# Patient Record
Sex: Female | Born: 1945
Health system: Southern US, Community
[De-identification: ages and names within clinical notes are randomized; demographics above are authoritative.]

## PROBLEM LIST (undated history)

## (undated) DIAGNOSIS — I7300 Raynaud's syndrome without gangrene: Secondary | ICD-10-CM

## (undated) DIAGNOSIS — N952 Postmenopausal atrophic vaginitis: Secondary | ICD-10-CM

## (undated) DIAGNOSIS — S022XXA Fracture of nasal bones, initial encounter for closed fracture: Secondary | ICD-10-CM

## (undated) DIAGNOSIS — R112 Nausea with vomiting, unspecified: Secondary | ICD-10-CM

## (undated) DIAGNOSIS — IMO0002 Reserved for concepts with insufficient information to code with codable children: Secondary | ICD-10-CM

## (undated) DIAGNOSIS — R350 Frequency of micturition: Secondary | ICD-10-CM

## (undated) DIAGNOSIS — M349 Systemic sclerosis, unspecified: Secondary | ICD-10-CM

## (undated) DIAGNOSIS — L719 Rosacea, unspecified: Secondary | ICD-10-CM

## (undated) DIAGNOSIS — Z9889 Other specified postprocedural states: Secondary | ICD-10-CM

## (undated) DIAGNOSIS — M8580 Other specified disorders of bone density and structure, unspecified site: Secondary | ICD-10-CM

## (undated) DIAGNOSIS — Z460 Encounter for fitting and adjustment of spectacles and contact lenses: Secondary | ICD-10-CM

## (undated) DIAGNOSIS — C801 Malignant (primary) neoplasm, unspecified: Secondary | ICD-10-CM

## (undated) DIAGNOSIS — N816 Rectocele: Secondary | ICD-10-CM

## (undated) DIAGNOSIS — I7090 Unspecified atherosclerosis: Secondary | ICD-10-CM

## (undated) HISTORY — DX: Other specified disorders of bone density and structure, unspecified site: M85.80

## (undated) HISTORY — DX: Malignant (primary) neoplasm, unspecified: C80.1

## (undated) HISTORY — PX: OOPHORECTOMY: SHX86

## (undated) HISTORY — DX: Reserved for concepts with insufficient information to code with codable children: IMO0002

## (undated) HISTORY — PX: TONSILLECTOMY: SUR1361

## (undated) HISTORY — DX: Rectocele: N81.6

## (undated) HISTORY — DX: Systemic sclerosis, unspecified: M34.9

## (undated) HISTORY — DX: Postmenopausal atrophic vaginitis: N95.2

## (undated) HISTORY — PX: NOSE SURGERY: SHX723

## (undated) HISTORY — DX: Raynaud's syndrome without gangrene: I73.00

## (undated) HISTORY — PX: OTHER SURGICAL HISTORY: SHX169

## (undated) HISTORY — PX: ROTATOR CUFF REPAIR: SHX139

## (undated) HISTORY — PX: APPENDECTOMY: SHX54

## (undated) HISTORY — DX: Rosacea, unspecified: L71.9

---

## 1992-02-11 HISTORY — PX: VAGINAL HYSTERECTOMY: SUR661

## 1993-02-10 DIAGNOSIS — C801 Malignant (primary) neoplasm, unspecified: Secondary | ICD-10-CM

## 1993-02-10 HISTORY — DX: Malignant (primary) neoplasm, unspecified: C80.1

## 1993-02-10 HISTORY — PX: COLON RESECTION: SHX5231

## 1997-06-28 ENCOUNTER — Other Ambulatory Visit: Admission: RE | Admit: 1997-06-28 | Discharge: 1997-06-28 | Payer: Self-pay | Admitting: Obstetrics and Gynecology

## 1998-10-31 ENCOUNTER — Other Ambulatory Visit: Admission: RE | Admit: 1998-10-31 | Discharge: 1998-10-31 | Payer: Self-pay | Admitting: Obstetrics and Gynecology

## 1999-11-29 ENCOUNTER — Ambulatory Visit (HOSPITAL_COMMUNITY): Admission: RE | Admit: 1999-11-29 | Discharge: 1999-11-29 | Payer: Self-pay | Admitting: Gastroenterology

## 2000-01-01 ENCOUNTER — Other Ambulatory Visit: Admission: RE | Admit: 2000-01-01 | Discharge: 2000-01-01 | Payer: Self-pay | Admitting: Obstetrics and Gynecology

## 2001-05-27 ENCOUNTER — Other Ambulatory Visit: Admission: RE | Admit: 2001-05-27 | Discharge: 2001-05-27 | Payer: Self-pay | Admitting: Obstetrics and Gynecology

## 2002-06-08 ENCOUNTER — Other Ambulatory Visit: Admission: RE | Admit: 2002-06-08 | Discharge: 2002-06-08 | Payer: Self-pay | Admitting: Obstetrics and Gynecology

## 2003-06-16 ENCOUNTER — Other Ambulatory Visit: Admission: RE | Admit: 2003-06-16 | Discharge: 2003-06-16 | Payer: Self-pay | Admitting: Obstetrics and Gynecology

## 2004-06-20 ENCOUNTER — Other Ambulatory Visit: Admission: RE | Admit: 2004-06-20 | Discharge: 2004-06-20 | Payer: Self-pay | Admitting: Addiction Medicine

## 2005-06-25 ENCOUNTER — Other Ambulatory Visit: Admission: RE | Admit: 2005-06-25 | Discharge: 2005-06-25 | Payer: Self-pay | Admitting: Obstetrics and Gynecology

## 2005-09-29 ENCOUNTER — Encounter: Admission: RE | Admit: 2005-09-29 | Discharge: 2005-09-29 | Payer: Self-pay | Admitting: Internal Medicine

## 2006-07-30 ENCOUNTER — Other Ambulatory Visit: Admission: RE | Admit: 2006-07-30 | Discharge: 2006-07-30 | Payer: Self-pay | Admitting: Obstetrics and Gynecology

## 2007-07-14 ENCOUNTER — Encounter: Admission: RE | Admit: 2007-07-14 | Discharge: 2007-07-14 | Payer: Self-pay | Admitting: Internal Medicine

## 2007-08-03 ENCOUNTER — Other Ambulatory Visit: Admission: RE | Admit: 2007-08-03 | Discharge: 2007-08-03 | Payer: Self-pay | Admitting: Obstetrics and Gynecology

## 2008-08-30 ENCOUNTER — Ambulatory Visit: Payer: Self-pay | Admitting: Obstetrics and Gynecology

## 2008-08-30 ENCOUNTER — Other Ambulatory Visit: Admission: RE | Admit: 2008-08-30 | Discharge: 2008-08-30 | Payer: Self-pay | Admitting: Obstetrics and Gynecology

## 2008-08-30 ENCOUNTER — Encounter: Payer: Self-pay | Admitting: Obstetrics and Gynecology

## 2008-09-12 ENCOUNTER — Ambulatory Visit: Payer: Self-pay | Admitting: Obstetrics and Gynecology

## 2009-05-21 ENCOUNTER — Ambulatory Visit: Payer: Self-pay | Admitting: Internal Medicine

## 2009-05-24 ENCOUNTER — Encounter: Payer: Self-pay | Admitting: Internal Medicine

## 2009-09-04 ENCOUNTER — Ambulatory Visit: Payer: Self-pay | Admitting: Obstetrics and Gynecology

## 2009-09-04 ENCOUNTER — Other Ambulatory Visit: Admission: RE | Admit: 2009-09-04 | Discharge: 2009-09-04 | Payer: Self-pay | Admitting: Obstetrics and Gynecology

## 2009-09-06 ENCOUNTER — Ambulatory Visit: Payer: Self-pay | Admitting: Obstetrics and Gynecology

## 2010-03-12 NOTE — Miscellaneous (Signed)
Summary: Orders Update pft charges  Clinical Lists Changes  Orders: Added new Service order of Carbon Monoxide diffusing w/capacity (94720) - Signed Added new Service order of Lung Volumes (94240) - Signed Added new Service order of Spirometry (Pre & Post) (94060) - Signed 

## 2010-06-28 NOTE — Procedures (Signed)
[facility]  Patient:    Lindsay Herman, Lindsay Herman                      MRN: [id number] Proc. Date: 11/29/99 Adm. Date:  [id number] Attending:  Orland Mustard CC:         Richard A. Jacky Kindle, M.D.  Sandria Bales. Ezzard Standing, M.D.  Leighton Roach. Truett Perna, M.D.   Procedure Report  PROCEDURE:  Colonoscopy.  MEDICATIONS:  Fentanyl 75 mcg, Versed 6 mg IV.  SCOPE:  Adult Olympus video colonoscope.  INDICATION:  A nice 65 year old woman who had a low anterior resection due to adenocarcinoma of the distal sigmoid, Dukes stage C.  Was treated with 5-FU and levamisole.  This is done as a follow-up.  DESCRIPTION OF PROCEDURE:  The procedure had been explained to the patient and consent obtained.  She was placed in the left lateral decubitus position.  The Olympus adult video colonoscope was inserted, advanced under direct visualization.  The prep was excellent.  We were able to advance to the cecum without difficulty.  The ileocecal valve and appendiceal orifice were seen. No polyps were seen in the cecum.  The ileocecal valve was entered for a short distance.  The scope was withdrawn, and the cecum, ascending colon, hepatic flexure, transverse colon, splenic flexure, descending, and sigmoid colon were seen well upon removal.  No polyps were seen throughout.  The anastomosis in the high rectum was seen and was patent and was normal.  No granulation tissue, sutures, or any evidence of recurrent tumor.  The scope was withdrawn. The patient tolerated the procedure well.  She was maintained on low-flow oxygen and pulse oximetry throughout the procedure with no obvious problem.  ASSESSMENT:  No evidence of further polyp or neoplasia.  PLAN:  Will recommend repeating the procedure in three years. DD:  11/29/99 TD:  11/29/99 Job: [id number]

## 2010-10-02 ENCOUNTER — Other Ambulatory Visit: Payer: Self-pay | Admitting: Obstetrics and Gynecology

## 2010-10-31 ENCOUNTER — Other Ambulatory Visit: Payer: Self-pay | Admitting: Obstetrics and Gynecology

## 2010-11-05 ENCOUNTER — Encounter: Payer: Self-pay | Admitting: Gynecology

## 2010-11-05 DIAGNOSIS — M8580 Other specified disorders of bone density and structure, unspecified site: Secondary | ICD-10-CM | POA: Insufficient documentation

## 2010-11-05 DIAGNOSIS — N952 Postmenopausal atrophic vaginitis: Secondary | ICD-10-CM | POA: Insufficient documentation

## 2010-11-05 DIAGNOSIS — N951 Menopausal and female climacteric states: Secondary | ICD-10-CM | POA: Insufficient documentation

## 2010-11-13 ENCOUNTER — Encounter: Payer: Self-pay | Admitting: Obstetrics and Gynecology

## 2010-11-13 ENCOUNTER — Other Ambulatory Visit (HOSPITAL_COMMUNITY)
Admission: RE | Admit: 2010-11-13 | Discharge: 2010-11-13 | Disposition: A | Discharge status: Home / Self Care | Payer: BC Managed Care – PPO | Source: Ambulatory Visit | Attending: Obstetrics and Gynecology | Admitting: Obstetrics and Gynecology

## 2010-11-13 ENCOUNTER — Ambulatory Visit (INDEPENDENT_AMBULATORY_CARE_PROVIDER_SITE_OTHER): Payer: BC Managed Care – PPO | Admitting: Obstetrics and Gynecology

## 2010-11-13 VITALS — BP 120/70 | Ht 66.0 in | Wt 157.0 lb

## 2010-11-13 DIAGNOSIS — M949 Disorder of cartilage, unspecified: Secondary | ICD-10-CM

## 2010-11-13 DIAGNOSIS — R350 Frequency of micturition: Secondary | ICD-10-CM

## 2010-11-13 DIAGNOSIS — N951 Menopausal and female climacteric states: Secondary | ICD-10-CM

## 2010-11-13 DIAGNOSIS — N8111 Cystocele, midline: Secondary | ICD-10-CM

## 2010-11-13 DIAGNOSIS — Z780 Asymptomatic menopausal state: Secondary | ICD-10-CM

## 2010-11-13 DIAGNOSIS — M8580 Other specified disorders of bone density and structure, unspecified site: Secondary | ICD-10-CM

## 2010-11-13 DIAGNOSIS — R3915 Urgency of urination: Secondary | ICD-10-CM

## 2010-11-13 DIAGNOSIS — Z01419 Encounter for gynecological examination (general) (routine) without abnormal findings: Secondary | ICD-10-CM | POA: Insufficient documentation

## 2010-11-13 DIAGNOSIS — R82998 Other abnormal findings in urine: Secondary | ICD-10-CM

## 2010-11-13 DIAGNOSIS — IMO0002 Reserved for concepts with insufficient information to code with codable children: Secondary | ICD-10-CM

## 2010-11-13 MED ORDER — ESTRADIOL 0.0375 MG/24HR TD PTWK: 1.0000 | MEDICATED_PATCH | TRANSDERMAL | Status: DC

## 2010-11-13 NOTE — Progress Notes (Signed)
Patient came to see me today for her annual GYN exam. She remains on her Climara patch with excellent results. She is up-to-date on mammograms. She is due for followup bone density with osteopenia. She has had a fracture since her last seen her but it was clearly a traumatic fracture. She is having trouble with nocturia x3 or 4 and urgency.  HEENT: Within normal limits. Neck: No masses. Supraclavicular lymph nodes: Not enlarged. Breasts: Examined in both sitting and lying position. Symmetrical without skin changes or masses. Abdomen: Soft no masses guarding or rebound. No hernias. Pelvic: External within normal limits. BUS within normal limits. Vaginal examination shows good estrogen effect, 1 and a half degree cystocele, no enterocele or rectocele. Cervix and uterus absent. Adnexa within normal limits. Rectovaginal confirmatory.   Extremities within normal limits.  Assessment: Menopausal symptoms. Detrussor instability. Osteopenia. Cystocele  Plan: Continue Climara patch. Continue yearly mammograms. Bone density scheduled. Lab work through PCP. Vesicare 5 mg daily samples given

## 2010-11-13 NOTE — Progress Notes (Signed)
Addended by: Landis Martins R on: 11/13/2010 03:38 PM   Modules accepted: Orders

## 2010-12-12 ENCOUNTER — Ambulatory Visit (INDEPENDENT_AMBULATORY_CARE_PROVIDER_SITE_OTHER): Payer: BC Managed Care – PPO

## 2010-12-12 ENCOUNTER — Ambulatory Visit (INDEPENDENT_AMBULATORY_CARE_PROVIDER_SITE_OTHER): Payer: BC Managed Care – PPO | Admitting: Gynecology

## 2010-12-12 ENCOUNTER — Encounter: Payer: Self-pay | Admitting: Obstetrics and Gynecology

## 2010-12-12 DIAGNOSIS — M8580 Other specified disorders of bone density and structure, unspecified site: Secondary | ICD-10-CM

## 2010-12-12 DIAGNOSIS — M899 Disorder of bone, unspecified: Secondary | ICD-10-CM

## 2010-12-12 DIAGNOSIS — Z23 Encounter for immunization: Secondary | ICD-10-CM

## 2011-04-01 ENCOUNTER — Ambulatory Visit (INDEPENDENT_AMBULATORY_CARE_PROVIDER_SITE_OTHER): Payer: BC Managed Care – PPO | Admitting: Obstetrics and Gynecology

## 2011-04-01 DIAGNOSIS — N949 Unspecified condition associated with female genital organs and menstrual cycle: Secondary | ICD-10-CM

## 2011-04-01 DIAGNOSIS — IMO0002 Reserved for concepts with insufficient information to code with codable children: Secondary | ICD-10-CM

## 2011-04-01 DIAGNOSIS — N8111 Cystocele, midline: Secondary | ICD-10-CM

## 2011-04-01 LAB — URINALYSIS W MICROSCOPIC + REFLEX CULTURE
Bilirubin Urine: NEGATIVE
Crystals: NONE SEEN
Glucose, UA: NEGATIVE mg/dL
Hgb urine dipstick: NEGATIVE
Ketones, ur: NEGATIVE mg/dL
Nitrite: NEGATIVE
Protein, ur: NEGATIVE mg/dL
Specific Gravity, Urine: >1.03 — ABNORMAL HIGH (ref 1.005–1.030)

## 2011-04-01 NOTE — Progress Notes (Signed)
Patient came to see me today with symptoms of vaginal pressure and feeling as if something is falling out. She says the symptoms are probably been going on for 2 years but they're getting progressively worse. The most annoying symptom is urgency and frequency of urination. She has no incontinence. We tried Vesicare last year and she gave it  a short trial without success.   Exam: Kennon Portela present. External: Within normal limits. BUS: Within normal limits. Vaginal exam: Patient now has a second-degree cystocele which is worse then her last exam. Her vault is well supported. She has no rectocele. She is excellent estrogen effect.  Assessment: Symptomatic cystocele.  Plan: Discussed medication versus surgery. Discussed recurrence rate with surgery. Discussed need for urodynamics before surgery. For  the moment increased her Vesicare to 10 mg daily. She will inform me when necessary. We will check her for UTI today.

## 2011-04-01 NOTE — Progress Notes (Signed)
Addended by: Dayna Barker on: 04/01/2011 04:15 PM   Modules accepted: Orders

## 2011-04-04 LAB — URINE CULTURE: Colony Count: 10000

## 2011-11-18 ENCOUNTER — Other Ambulatory Visit: Payer: Self-pay | Admitting: Obstetrics and Gynecology

## 2011-11-20 ENCOUNTER — Encounter: Payer: Self-pay | Admitting: Obstetrics and Gynecology

## 2011-12-03 ENCOUNTER — Encounter: Payer: Self-pay | Admitting: Obstetrics and Gynecology

## 2011-12-03 ENCOUNTER — Ambulatory Visit (INDEPENDENT_AMBULATORY_CARE_PROVIDER_SITE_OTHER): Payer: Medicare Other | Admitting: Obstetrics and Gynecology

## 2011-12-03 VITALS — BP 124/70 | Ht 66.5 in | Wt 156.0 lb

## 2011-12-03 DIAGNOSIS — IMO0002 Reserved for concepts with insufficient information to code with codable children: Secondary | ICD-10-CM

## 2011-12-03 DIAGNOSIS — R351 Nocturia: Secondary | ICD-10-CM

## 2011-12-03 DIAGNOSIS — Z780 Asymptomatic menopausal state: Secondary | ICD-10-CM

## 2011-12-03 DIAGNOSIS — M949 Disorder of cartilage, unspecified: Secondary | ICD-10-CM

## 2011-12-03 DIAGNOSIS — N952 Postmenopausal atrophic vaginitis: Secondary | ICD-10-CM

## 2011-12-03 DIAGNOSIS — M899 Disorder of bone, unspecified: Secondary | ICD-10-CM

## 2011-12-03 DIAGNOSIS — M8580 Other specified disorders of bone density and structure, unspecified site: Secondary | ICD-10-CM

## 2011-12-03 DIAGNOSIS — C189 Malignant neoplasm of colon, unspecified: Secondary | ICD-10-CM

## 2011-12-03 DIAGNOSIS — N8111 Cystocele, midline: Secondary | ICD-10-CM

## 2011-12-03 MED ORDER — ESTRADIOL 0.0375 MG/24HR TD PTWK: 1.0000 | MEDICATED_PATCH | TRANSDERMAL | Status: DC

## 2011-12-03 MED ORDER — FESOTERODINE FUMARATE ER 4 MG PO TB24: 4.0000 mg | ORAL_TABLET | Freq: Every day | ORAL | Status: DC

## 2011-12-03 NOTE — Patient Instructions (Signed)
Continue yearly mammograms. Bone density in 2014. 

## 2011-12-03 NOTE — Progress Notes (Signed)
Patient came to see me today for her annual GYN exam. She remains on a Climara patch for menopausal symptoms with excellent results. For a while she used Estrace vaginal cream for atrophic vaginitis which she  now stopped. She doesn't feel the need to use it anymore. She has a cystocele we've been watching. She is getting nocturia x4 and wonders if there is medical treatment without surgery. She does not have urgency or incontinence. She is having no vaginal bleeding. She is having no pelvic pain. Her last Pap smear was 2012. She has always had normal Pap smears. She had a vaginal hysterectomy in 1994 for her dysfunctional uterine bleeding and fibroids. In 1995 she had colon cancer and had the left part of her colon resected  along with her left ovary and fallopian tube and an  Appendectomy. She is up-to-date on her mammograms.Her last bone density was in 2012 and showed osteopenia with out an elevated fracture risk. It was improved over the previous one. She's had no fractures.  ROS: 12 system review done. Pertinent positives above.  HEENT: Within normal limits. Neck: No masses. Supraclavicular lymph nodes: Not enlarged. Breasts: Examined in both sitting and lying position. Symmetrical without skin changes or masses. Abdomen: Soft no masses guarding or rebound. No hernias. Pelvic: External within normal limits. BUS within normal limits. Vaginal examination shows fair  estrogen effect, third degree cystocele. no enterocele or rectocele. Cervix and uterus absent. Adnexa within normal limits. Rectovaginal confirmatory. Extremities within normal limits.  Assessment: #1. Menopausal symptoms #2. Atrophic vaginitis #3. Nocturia #4. Cystocele #5. Osteopenia #6. Colon cancer without existing disease.  Plan: Continue Climara. Continue yearly mammograms. Bone density in 2014. Patient started on Toviaz 4 mgm daily-samples given. Pt will call and let me know how she is doing.The new Pap smear guidelines were  discussed with the patient. Pap not done.

## 2012-03-04 ENCOUNTER — Encounter: Payer: Self-pay | Admitting: Gynecology

## 2012-03-04 ENCOUNTER — Ambulatory Visit (INDEPENDENT_AMBULATORY_CARE_PROVIDER_SITE_OTHER): Payer: Medicare Other | Admitting: Gynecology

## 2012-03-04 DIAGNOSIS — N8111 Cystocele, midline: Secondary | ICD-10-CM

## 2012-03-04 DIAGNOSIS — N816 Rectocele: Secondary | ICD-10-CM

## 2012-03-04 DIAGNOSIS — R351 Nocturia: Secondary | ICD-10-CM

## 2012-03-04 DIAGNOSIS — R3915 Urgency of urination: Secondary | ICD-10-CM

## 2012-03-04 NOTE — Patient Instructions (Signed)
Office will contact you  To arrange urology appointment.

## 2012-03-04 NOTE — Progress Notes (Signed)
Patient presents with a history of cystocele that she feels is getting worse. She now feels more pressure when she is on her feet and her introital opening. Does not notice anything protruding. Her most bothersome symptom though his nocturia which he is getting up 3-4 times nightly because of feelings of having to empty her bladder with little coming out. No frequency during the day dysuria low back pain or difficulty with bowel movements. Does limit her fluid intake after dinner.  No urinary incontinence with sneezing laughing coughing or with urgency incontinence. Is not currently sexually active but possibility remains.  Exam was can present Abdomen soft nontender without masses guarding rebound organomegaly. Pelvic external BUS vagina with second-degree cystocele. Vaginal cuff well supported. First degree rectocele. Bimanual without masses or tenderness. Rectovaginal exam confirms.  Assessment and plan: Cystocele/rectocele/nocturia/urgency symptoms. Check UA rule out infection/blood.  Reviewed options to include expectant management, pessary trial, surgery. Options for surgery to include straightforward anterior/posterior colporrhaphy versus consideration for mesh. Given patient's other symptoms to include nocturia and some urgency I think the more involved bladder evaluations indicated and I'm going to refer her to urology and she agrees with this. We went help her make that arrangement and will go from there.

## 2012-03-08 ENCOUNTER — Telehealth: Payer: Self-pay | Admitting: *Deleted

## 2012-03-08 NOTE — Telephone Encounter (Signed)
Message copied by Aura Camps on Mon Mar 08, 2012 10:51 AM ------      Message from: Dara Lords      Created: Thu Mar 04, 2012 12:57 PM       Schedule appointment with urology RE Dr. Sherron Monday reference cystocele/urgency/nocturia for consideration for repair possible mesh

## 2012-03-08 NOTE — Telephone Encounter (Signed)
Left message for pt to call regarding time and date. Notes faxed to [facility], appointment on feb 5 th 2 12:45 pm

## 2012-03-15 NOTE — Telephone Encounter (Signed)
Pt informed with the below note. 

## 2012-07-26 ENCOUNTER — Other Ambulatory Visit: Payer: Self-pay | Admitting: Orthopedic Surgery

## 2012-08-10 ENCOUNTER — Ambulatory Visit (HOSPITAL_BASED_OUTPATIENT_CLINIC_OR_DEPARTMENT_OTHER): Admission: RE | Admit: 2012-08-10 | Payer: Medicare Other | Source: Ambulatory Visit | Admitting: Orthopedic Surgery

## 2012-08-10 ENCOUNTER — Encounter (HOSPITAL_BASED_OUTPATIENT_CLINIC_OR_DEPARTMENT_OTHER): Admission: RE | Payer: Self-pay | Source: Ambulatory Visit

## 2012-08-10 SURGERY — EXCISION METACARPAL MASS
Anesthesia: Regional | Site: Hand | Laterality: Left

## 2012-08-12 ENCOUNTER — Other Ambulatory Visit: Payer: Self-pay | Admitting: Orthopedic Surgery

## 2012-08-25 ENCOUNTER — Other Ambulatory Visit: Payer: Self-pay | Admitting: Orthopedic Surgery

## 2012-08-25 ENCOUNTER — Encounter (HOSPITAL_BASED_OUTPATIENT_CLINIC_OR_DEPARTMENT_OTHER): Payer: Self-pay | Admitting: *Deleted

## 2012-08-25 DIAGNOSIS — M25511 Pain in right shoulder: Secondary | ICD-10-CM

## 2012-08-25 NOTE — Progress Notes (Signed)
To come in for [id number] having problems with shoulders and may have to have something done later

## 2012-08-27 ENCOUNTER — Encounter (HOSPITAL_BASED_OUTPATIENT_CLINIC_OR_DEPARTMENT_OTHER)
Admission: RE | Admit: 2012-08-27 | Discharge: 2012-08-27 | Disposition: A | Discharge status: Home / Self Care | Payer: Medicare Other | Source: Ambulatory Visit | Attending: Orthopedic Surgery | Admitting: Orthopedic Surgery

## 2012-08-27 LAB — BASIC METABOLIC PANEL
BUN: 13 mg/dL (ref 6–23)
Calcium: 9.7 mg/dL (ref 8.4–10.5)
GFR calc Af Amer: 86 mL/min — ABNORMAL LOW (ref >90)
GFR calc non Af Amer: 75 mL/min — ABNORMAL LOW (ref >90)
Glucose, Bld: 67 mg/dL — ABNORMAL LOW (ref 70–99)
Potassium: 4.6 mEq/L (ref 3.5–5.1)

## 2012-08-31 ENCOUNTER — Encounter (HOSPITAL_BASED_OUTPATIENT_CLINIC_OR_DEPARTMENT_OTHER): Payer: Self-pay | Admitting: Orthopedic Surgery

## 2012-08-31 ENCOUNTER — Encounter (HOSPITAL_BASED_OUTPATIENT_CLINIC_OR_DEPARTMENT_OTHER)
Admission: RE | Disposition: A | Discharge status: Home / Self Care | Payer: Self-pay | Source: Ambulatory Visit | Attending: Orthopedic Surgery

## 2012-08-31 ENCOUNTER — Ambulatory Visit (HOSPITAL_BASED_OUTPATIENT_CLINIC_OR_DEPARTMENT_OTHER)
Admission: RE | Admit: 2012-08-31 | Discharge: 2012-08-31 | Disposition: A | Discharge status: Home / Self Care | Payer: Medicare Other | Source: Ambulatory Visit | Attending: Orthopedic Surgery | Admitting: Orthopedic Surgery

## 2012-08-31 ENCOUNTER — Ambulatory Visit (HOSPITAL_BASED_OUTPATIENT_CLINIC_OR_DEPARTMENT_OTHER): Payer: Medicare Other | Admitting: Anesthesiology

## 2012-08-31 ENCOUNTER — Encounter (HOSPITAL_BASED_OUTPATIENT_CLINIC_OR_DEPARTMENT_OTHER): Payer: Self-pay | Admitting: Anesthesiology

## 2012-08-31 DIAGNOSIS — M949 Disorder of cartilage, unspecified: Secondary | ICD-10-CM | POA: Insufficient documentation

## 2012-08-31 DIAGNOSIS — M349 Systemic sclerosis, unspecified: Secondary | ICD-10-CM | POA: Insufficient documentation

## 2012-08-31 DIAGNOSIS — Z885 Allergy status to narcotic agent status: Secondary | ICD-10-CM | POA: Insufficient documentation

## 2012-08-31 DIAGNOSIS — Z9049 Acquired absence of other specified parts of digestive tract: Secondary | ICD-10-CM | POA: Insufficient documentation

## 2012-08-31 DIAGNOSIS — Z87891 Personal history of nicotine dependence: Secondary | ICD-10-CM | POA: Insufficient documentation

## 2012-08-31 DIAGNOSIS — L719 Rosacea, unspecified: Secondary | ICD-10-CM | POA: Insufficient documentation

## 2012-08-31 DIAGNOSIS — M898X9 Other specified disorders of bone, unspecified site: Secondary | ICD-10-CM | POA: Insufficient documentation

## 2012-08-31 DIAGNOSIS — M19049 Primary osteoarthritis, unspecified hand: Secondary | ICD-10-CM | POA: Insufficient documentation

## 2012-08-31 DIAGNOSIS — D2110 Benign neoplasm of connective and other soft tissue of unspecified upper limb, including shoulder: Secondary | ICD-10-CM | POA: Insufficient documentation

## 2012-08-31 DIAGNOSIS — M899 Disorder of bone, unspecified: Secondary | ICD-10-CM | POA: Insufficient documentation

## 2012-08-31 DIAGNOSIS — Z85038 Personal history of other malignant neoplasm of large intestine: Secondary | ICD-10-CM | POA: Insufficient documentation

## 2012-08-31 HISTORY — DX: Encounter for fitting and adjustment of spectacles and contact lenses: Z46.0

## 2012-08-31 HISTORY — DX: Nausea with vomiting, unspecified: R11.2

## 2012-08-31 HISTORY — DX: Frequency of micturition: R35.0

## 2012-08-31 HISTORY — DX: Other specified postprocedural states: Z98.890

## 2012-08-31 HISTORY — PX: MASS EXCISION: SHX2000

## 2012-08-31 SURGERY — EXCISION MASS
Anesthesia: Regional | Site: Hand | Laterality: Left | Wound class: Clean

## 2012-08-31 MED ORDER — OXYCODONE HCL 5 MG/5ML PO SOLN: 5.0000 mg | Freq: Once | ORAL | Status: DC | PRN

## 2012-08-31 MED ORDER — FENTANYL CITRATE 0.05 MG/ML IJ SOLN
INTRAMUSCULAR | Status: DC | PRN
  Administered 2012-08-31: 50 ug via INTRAVENOUS

## 2012-08-31 MED ORDER — LIDOCAINE HCL (CARDIAC) 20 MG/ML IV SOLN
INTRAVENOUS | Status: DC | PRN
  Administered 2012-08-31: 50 mg via INTRAVENOUS

## 2012-08-31 MED ORDER — TRAMADOL HCL 50 MG PO TABS: 50.0000 mg | ORAL_TABLET | Freq: Four times a day (QID) | ORAL | Status: DC | PRN

## 2012-08-31 MED ORDER — MIDAZOLAM HCL 2 MG/2ML IJ SOLN: 0.5000 mg | Freq: Once | INTRAMUSCULAR | Status: DC | PRN

## 2012-08-31 MED ORDER — ONDANSETRON HCL 4 MG/2ML IJ SOLN
INTRAMUSCULAR | Status: DC | PRN
  Administered 2012-08-31: 4 mg via INTRAVENOUS

## 2012-08-31 MED ORDER — BUPIVACAINE HCL (PF) 0.25 % IJ SOLN
INTRAMUSCULAR | Status: DC | PRN
  Administered 2012-08-31: 7 mL

## 2012-08-31 MED ORDER — CHLORHEXIDINE GLUCONATE 4 % EX LIQD: 60.0000 mL | Freq: Once | CUTANEOUS | Status: DC

## 2012-08-31 MED ORDER — OXYCODONE HCL 5 MG PO TABS: 5.0000 mg | ORAL_TABLET | Freq: Once | ORAL | Status: DC | PRN

## 2012-08-31 MED ORDER — MIDAZOLAM HCL 5 MG/5ML IJ SOLN
INTRAMUSCULAR | Status: DC | PRN
  Administered 2012-08-31: 1 mg via INTRAVENOUS

## 2012-08-31 MED ORDER — LACTATED RINGERS IV SOLN
INTRAVENOUS | Status: DC
Start: 2012-08-31 — End: 2012-08-31
  Administered 2012-08-31: 09:00:00 via INTRAVENOUS

## 2012-08-31 MED ORDER — CEFAZOLIN SODIUM-DEXTROSE 2-3 GM-% IV SOLR
2.0000 g | INTRAVENOUS | Status: AC
  Administered 2012-08-31: 2 g via INTRAVENOUS

## 2012-08-31 MED ORDER — FENTANYL CITRATE 0.05 MG/ML IJ SOLN: 25.0000 ug | INTRAMUSCULAR | Status: DC | PRN

## 2012-08-31 MED ORDER — PROMETHAZINE HCL 25 MG/ML IJ SOLN: 6.2500 mg | INTRAMUSCULAR | Status: DC | PRN

## 2012-08-31 MED ORDER — MEPERIDINE HCL 25 MG/ML IJ SOLN: 6.2500 mg | INTRAMUSCULAR | Status: DC | PRN

## 2012-08-31 MED ORDER — PROPOFOL INFUSION 10 MG/ML OPTIME
INTRAVENOUS | Status: DC | PRN
  Administered 2012-08-31: 50 ug/kg/min via INTRAVENOUS

## 2012-08-31 SURGICAL SUPPLY — 52 items
BANDAGE COBAN STERILE 2 (GAUZE/BANDAGES/DRESSINGS) ×2 IMPLANT
BANDAGE GAUZE ELAST BULKY 4 IN (GAUZE/BANDAGES/DRESSINGS) IMPLANT
BLADE MINI RND TIP GREEN BEAV (BLADE) IMPLANT
BLADE SURG 15 STRL LF DISP TIS (BLADE) ×1 IMPLANT
BLADE SURG 15 STRL SS (BLADE) ×1
BNDG COHESIVE 1X5 TAN STRL LF (GAUZE/BANDAGES/DRESSINGS) IMPLANT
BNDG COHESIVE 3X5 TAN STRL LF (GAUZE/BANDAGES/DRESSINGS) IMPLANT
BNDG ESMARK 4X9 LF (GAUZE/BANDAGES/DRESSINGS) IMPLANT
CHLORAPREP W/TINT 26ML (MISCELLANEOUS) ×2 IMPLANT
CLOTH BEACON ORANGE TIMEOUT ST (SAFETY) ×2 IMPLANT
CORDS BIPOLAR (ELECTRODE) ×2 IMPLANT
COVER MAYO STAND STRL (DRAPES) ×2 IMPLANT
COVER TABLE BACK 60X90 (DRAPES) ×2 IMPLANT
CUFF TOURNIQUET SINGLE 18IN (TOURNIQUET CUFF) ×2 IMPLANT
DECANTER SPIKE VIAL GLASS SM (MISCELLANEOUS) IMPLANT
DRAIN PENROSE 1/2X12 LTX STRL (WOUND CARE) IMPLANT
DRAPE EXTREMITY T 121X128X90 (DRAPE) ×2 IMPLANT
DRAPE SURG 17X23 STRL (DRAPES) ×2 IMPLANT
GAUZE XEROFORM 1X8 LF (GAUZE/BANDAGES/DRESSINGS) ×2 IMPLANT
GLOVE BIO SURGEON STRL SZ 6.5 (GLOVE) ×2 IMPLANT
GLOVE BIO SURGEON STRL SZ7.5 (GLOVE) ×2 IMPLANT
GLOVE BIOGEL PI IND STRL 8 (GLOVE) ×1 IMPLANT
GLOVE BIOGEL PI IND STRL 8.5 (GLOVE) ×1 IMPLANT
GLOVE BIOGEL PI INDICATOR 8 (GLOVE) ×1
GLOVE BIOGEL PI INDICATOR 8.5 (GLOVE) ×1
GLOVE SURG ORTHO 8.0 STRL STRW (GLOVE) ×2 IMPLANT
GOWN BRE IMP PREV XXLGXLNG (GOWN DISPOSABLE) ×4 IMPLANT
GOWN PREVENTION PLUS XLARGE (GOWN DISPOSABLE) ×2 IMPLANT
NDL SAFETY ECLIPSE 18X1.5 (NEEDLE) ×1 IMPLANT
NEEDLE 27GAX1X1/2 (NEEDLE) ×2 IMPLANT
NEEDLE HYPO 18GX1.5 SHARP (NEEDLE) ×1
NS IRRIG 1000ML POUR BTL (IV SOLUTION) ×2 IMPLANT
PACK BASIN DAY SURGERY FS (CUSTOM PROCEDURE TRAY) ×2 IMPLANT
PAD CAST 3X4 CTTN HI CHSV (CAST SUPPLIES) IMPLANT
PADDING CAST ABS 3INX4YD NS (CAST SUPPLIES)
PADDING CAST ABS 4INX4YD NS (CAST SUPPLIES) ×1
PADDING CAST ABS COTTON 3X4 (CAST SUPPLIES) IMPLANT
PADDING CAST ABS COTTON 4X4 ST (CAST SUPPLIES) ×1 IMPLANT
PADDING CAST COTTON 3X4 STRL (CAST SUPPLIES)
SPLINT FINGER 5/8X3.25 (SOFTGOODS) ×1 IMPLANT
SPLINT FINGER FOAM 3 9119 05 (SOFTGOODS) ×2
SPLINT PLASTER CAST XFAST 3X15 (CAST SUPPLIES) IMPLANT
SPLINT PLASTER XTRA FASTSET 3X (CAST SUPPLIES)
SPONGE GAUZE 4X4 12PLY (GAUZE/BANDAGES/DRESSINGS) ×2 IMPLANT
STOCKINETTE 4X48 STRL (DRAPES) ×2 IMPLANT
SUT VIC AB 4-0 P2 18 (SUTURE) IMPLANT
SUT VICRYL RAPID 5 0 P 3 (SUTURE) IMPLANT
SUT VICRYL RAPIDE 4/0 PS 2 (SUTURE) ×2 IMPLANT
SYR BULB 3OZ (MISCELLANEOUS) ×2 IMPLANT
SYR CONTROL 10ML LL (SYRINGE) IMPLANT
TOWEL OR 17X24 6PK STRL BLUE (TOWEL DISPOSABLE) ×4 IMPLANT
UNDERPAD 30X30 INCONTINENT (UNDERPADS AND DIAPERS) ×2 IMPLANT

## 2012-08-31 NOTE — Anesthesia Preprocedure Evaluation (Signed)
Anesthesia Evaluation  Patient identified by MRN, date of birth, ID band Patient awake    Reviewed: Allergy & Precautions, H&P , NPO status , Patient's Chart, lab work & pertinent test results  History of Anesthesia Complications (+) PONV  Airway Mallampati: II TM Distance: >3 FB Neck ROM: Full    Dental  (+) Teeth Intact and Dental Advisory Given   Pulmonary former smoker,  breath sounds clear to auscultation  Pulmonary exam normal       Cardiovascular negative cardio ROS  Rhythm:Regular Rate:Normal     Neuro/Psych negative neurological ROS     GI/Hepatic negative GI ROS, Neg liver ROS,   Endo/Other  scleroderma  Renal/GU negative Renal ROS     Musculoskeletal   Abdominal   Peds  Hematology negative hematology ROS (+)   Anesthesia Other Findings   Reproductive/Obstetrics                           Anesthesia Physical Anesthesia Plan  ASA: II  Anesthesia Plan: MAC and Bier Block   Post-op Pain Management:    Induction:   Airway Management Planned: Simple Face Mask  Additional Equipment:   Intra-op Plan:   Post-operative Plan:   Informed Consent: I have reviewed the patients History and Physical, chart, labs and discussed the procedure including the risks, benefits and alternatives for the proposed anesthesia with the patient or authorized representative who has indicated his/her understanding and acceptance.   Dental advisory given  Plan Discussed with: CRNA and Surgeon  Anesthesia Plan Comments: (Plan routine monitors, MAC with IV regional Lidocaine)        Anesthesia Quick Evaluation

## 2012-08-31 NOTE — Brief Op Note (Signed)
08/31/2012  9:52 AM  PATIENT:  Lindsay Herman  67 y.o. female  PRE-OPERATIVE DIAGNOSIS:  MUCOID TUMOR LEFT MIDDLE FINGER, DEGENERATIVE JOINT DISEASE DISTAL INTERPHALANGEAL  POST-OPERATIVE DIAGNOSIS:  MUCOID TUMOR LEFT MIDDLE FINGER, DEGENERATIVE JOINT  PROCEDURE:  Procedure(s): EXCISION MUCOID CYST DEBRIDEMENT DISTAL INTERPHALANGEAL LEFT MIDDLE FINGER (Left)  SURGEON:  Surgeon(s) and Role:    * Nicki Reaper, MD - Primary    * Tami Ribas, MD - Assisting  PHYSICIAN ASSISTANT:   ASSISTANTS: K Bellamie Turney,MD   ANESTHESIA:   local and regional  EBL:  Total I/O In: 800 [I.V.:800] Out: -   BLOOD ADMINISTERED:none  DRAINS: none   LOCAL MEDICATIONS USED:  MARCAINE     SPECIMEN:  Excision  DISPOSITION OF SPECIMEN:  PATHOLOGY  COUNTS:  YES  TOURNIQUET:   Total Tourniquet Time Documented: Forearm (Left) - 24 minutes Total: Forearm (Left) - 24 minutes   DICTATION: .Other Dictation: Dictation Number [id number]  PLAN OF CARE: Discharge to home after PACU  PATIENT DISPOSITION:  PACU - hemodynamically stable.

## 2012-08-31 NOTE — Anesthesia Postprocedure Evaluation (Signed)
  Anesthesia Post-op Note  Patient: Lindsay Herman  Procedure(s) Performed: Procedure(s): EXCISION MUCOID CYST DEBRIDEMENT DISTAL INTERPHALANGEAL LEFT MIDDLE FINGER (Left)  Patient Location: PACU  Anesthesia Type:Bier block  Level of Consciousness: awake, alert , oriented and patient cooperative  Airway and Oxygen Therapy: Patient Spontanous Breathing  Post-op Pain: none  Post-op Assessment: Post-op Vital signs reviewed, Patient's Cardiovascular Status Stable, Respiratory Function Stable, Patent Airway, No signs of Nausea or vomiting and Pain level controlled  Post-op Vital Signs: Reviewed and stable  Complications: No apparent anesthesia complications

## 2012-08-31 NOTE — Op Note (Signed)
NAMEDALORES, WEGER NO.:  [id number]  MEDICAL RECORD NO.:  [id number]  LOCATION:                                 FACILITY:  PHYSICIAN:  Cindee Salt, M.D.            DATE OF BIRTH:  DATE OF PROCEDURE:  08/31/2012 DATE OF DISCHARGE:                              OPERATIVE REPORT   PREOPERATIVE DIAGNOSIS:  Mucoid cyst, distal interphalangeal joint, left middle finger.  POSTOPERATIVE DIAGNOSIS:  Mucoid cyst, distal interphalangeal joint, left middle finger.  OPERATION:  Excision of mucoid cyst, debridement distal interphalangeal joint, left middle finger.  SURGEON:  Cindee Salt, M.D.  ANESTHESIA:  Forearm based IV regional with metacarpal block.  HISTORY:  The patient is a 67 year old female with a history of a mucoid cyst on her left middle finger distal interphalangeal joint.  This has opened on 1 occasion to drain.  She has been on an antibiotic.  This had sealed over and recurred.  She is admitted now for excision and debridement.  Pre, peri, and postoperative course have been discussed along with risks and complications.  She is aware that there is no guarantee with the surgery; possibility of infection; recurrence of injury to arteries, nerves, tendons, incomplete relief of symptoms, dystrophy.  In the preoperative area, the patient is seen.  The extremity marked by both the patient and surgeon.  Antibiotic given.  DESCRIPTION OF PROCEDURE:  The patient was brought to the operating room where a forearm-based IV regional anesthetic was carried out without difficulty.  She was prepped using ChloraPrep, supine position with the left arm free.  A metacarpal block was given 0.25% Marcaine without epinephrine, 7 mL was used.  A curvilinear incision was made over the distal interphalangeal joint, carried down through subcutaneous tissue.   The dissection carried down to the cyst.  This was excised with a blunt sharp dissection.  The joint was opened on its  radial aspect.  A synovectomy was performed along with debridement of the distal interphalangeal joint osteophytes.  The specimen was sent to Pathology.  The wound was copiously irrigated with saline and skin closed with interrupted 4-0 Vicryl Rapide sutures.  A sterile compressive dressing and splint to the finger applied.  On deflation of the tourniquet, remaining fingers pinked.  She was taken to the recovery room for observation in satisfactory condition.  She will be discharged home to return in 1 week on Ultram.          ______________________________ Cindee Salt, M.D.     GK/MEDQ  D:  08/31/2012  T:  08/31/2012  Job:  [id number]

## 2012-08-31 NOTE — Transfer of Care (Signed)
Immediate Anesthesia Transfer of Care Note  Patient: Lindsay Herman  Procedure(s) Performed: Procedure(s): EXCISION MUCOID CYST DEBRIDEMENT DISTAL INTERPHALANGEAL LEFT MIDDLE FINGER (Left)  Patient Location: PACU  Anesthesia Type:Bier block  Level of Consciousness: awake, alert , oriented and patient cooperative  Airway & Oxygen Therapy: Patient Spontanous Breathing and Patient connected to face mask oxygen  Post-op Assessment: Report given to PACU RN and Post -op Vital signs reviewed and stable  Post vital signs: Reviewed and stable  Complications: No apparent anesthesia complications

## 2012-08-31 NOTE — Op Note (Signed)
Dictation Number [id number]

## 2012-08-31 NOTE — H&P (Signed)
Lindsay Herman is a 67 year-old right-hand dominant female complaining of a mass dorsal aspect of her left middle finger. This has been present for approximately one year.  She states that she was working when she felt a kind of pop.  It has not gone away. She was told by a dermatologist not to do anything to this unless it began hurting because they have a tendency to come back.  She does have history of scleroderma. She has no history of injury.  She has history of arthritis, no history of diabetes, thyroid problems or gout.  There is a family history of diabetes.  She has been tested.  This is on the dorsal central aspect of her left middle finger. She complains of an intermittent, mild, dull aching pain with a feeling of swelling.   ALLERGIES:   Morphine (nausea).  MEDICATIONS:    Oracea, Plaquenil and Norvasc.  SURGICAL HISTORY:    Tonsillectomy, hysterectomy, colon resection.  FAMILY MEDICAL HISTORY:    Positive for diabetes, heart disease, high blood pressure, arthritis.  SOCIAL HISTORY:     She does not smoke, drinks socially. She is married, retired.    REVIEW OF SYSTEMS:    Positive for colon cancer, contacts, otherwise negative 14 points. Lindsay Herman is an 67 y.o. female.   Chief Complaint: mucoid tumor LMF HPI: see above  Past Medical History  Diagnosis Date  . DUB (dysfunctional uterine bleeding)   . Fibroid   . Atrophic vaginitis   . Osteopenia   . Menopausal symptoms   . Cancer     Colon cancer  . Rosacea   . Scleroderma   . Cystocele   . Urinary frequency   . PONV (postoperative nausea and vomiting)   . Contact lens/glasses fitting     wears contacts or glasses    Past Surgical History  Procedure Laterality Date  . Oophorectomy      LSO  . Colon resection  1995  . Tonsillectomy    . Appendectomy    . Nose surgery    . Vaginal hysterectomy  1994    Family History  Problem Relation Age of Onset  . Diabetes Mother   . Heart disease Mother   . Stroke  Mother   . Diabetes Brother   . Hypertension Brother    Social History:  reports that she quit smoking about 40 years ago. She does not have any smokeless tobacco history on file. She reports that she drinks about 2.5 ounces of alcohol per week. Her drug history is not on file.  Allergies:  Allergies  Allergen Reactions  . Codeine Nausea And Vomiting  . Morphine And Related Nausea And Vomiting    No prescriptions prior to admission    No results found for this or any previous visit (from the past 48 hour(s)).  No results found.   Pertinent items are noted in HPI.  Height 5\' 7"  (1.702 m), weight 70.308 kg (155 lb).  General appearance: alert, cooperative and appears stated age Head: Normocephalic, without obvious abnormality Neck: no JVD Resp: clear to auscultation bilaterally Cardio: regular rate and rhythm, S1, S2 normal, no murmur, click, rub or gallop GI: soft, non-tender; bowel sounds normal; no masses,  no organomegaly Extremities: extremities normal, atraumatic, no cyanosis or edema Pulses: 2+ and symmetric Skin: Skin color, texture, turgor normal. No rashes or lesions Neurologic: Grossly normal Incision/Wound: na  Assessment/Plan RADIOGRAPHS:     X-rays reveal degenerative arthritis distal interphalangeal joint of all  fingers.  DIAGNOSIS:     Degenerative arthritis, but with mucoid cyst left middle finger.  RECOMMENDATIONS/PLAN:   We have discussed this with her. She is advised for the possibility of surgical excision with debridement of the joint.  The pre, peri and postoperative course were discussed along with the risks and complications.  The patient is aware there is no guarantee with the surgery, possibility of infection, recurrence, injury to arteries, nerves, tendons, incomplete relief of symptoms.  She would like to proceed.  She is scheduled for excision cyst with debridement DIP joint left middle finger as an outpatient under regional anesthesia.    Lindsay Herman 08/31/2012, 7:49 AM

## 2012-09-01 ENCOUNTER — Encounter (HOSPITAL_BASED_OUTPATIENT_CLINIC_OR_DEPARTMENT_OTHER): Payer: Self-pay | Admitting: Orthopedic Surgery

## 2012-09-01 ENCOUNTER — Ambulatory Visit
Admission: RE | Admit: 2012-09-01 | Discharge: 2012-09-01 | Disposition: A | Discharge status: Home / Self Care | Payer: Medicare Other | Source: Ambulatory Visit | Attending: Orthopedic Surgery | Admitting: Orthopedic Surgery

## 2012-09-01 DIAGNOSIS — M25511 Pain in right shoulder: Secondary | ICD-10-CM

## 2012-12-20 ENCOUNTER — Telehealth: Payer: Self-pay | Admitting: *Deleted

## 2012-12-20 MED ORDER — ESTRADIOL 0.0375 MG/24HR TD PTWK: 0.0375 mg | MEDICATED_PATCH | TRANSDERMAL | Status: DC

## 2012-12-20 NOTE — Telephone Encounter (Signed)
Pt has annual schedule on 01/26/13 will need refill on climara patch 0.0375 mg until annual appointment. Rx sent.

## 2012-12-24 ENCOUNTER — Encounter: Payer: Self-pay | Admitting: Gynecology

## 2013-01-26 ENCOUNTER — Encounter: Payer: Self-pay | Admitting: Gynecology

## 2013-01-26 ENCOUNTER — Ambulatory Visit (INDEPENDENT_AMBULATORY_CARE_PROVIDER_SITE_OTHER): Payer: Medicare Other | Admitting: Gynecology

## 2013-01-26 VITALS — BP 120/76 | Ht 67.0 in | Wt 157.0 lb

## 2013-01-26 DIAGNOSIS — M899 Disorder of bone, unspecified: Secondary | ICD-10-CM

## 2013-01-26 DIAGNOSIS — N8111 Cystocele, midline: Secondary | ICD-10-CM

## 2013-01-26 DIAGNOSIS — N816 Rectocele: Secondary | ICD-10-CM

## 2013-01-26 DIAGNOSIS — N952 Postmenopausal atrophic vaginitis: Secondary | ICD-10-CM

## 2013-01-26 DIAGNOSIS — M8580 Other specified disorders of bone density and structure, unspecified site: Secondary | ICD-10-CM

## 2013-01-26 MED ORDER — ESTRADIOL 0.0375 MG/24HR TD PTWK: 0.0375 mg | MEDICATED_PATCH | TRANSDERMAL | Status: DC

## 2013-01-26 NOTE — Progress Notes (Signed)
Lindsay Herman [birth year] [id number]        67 y.o.  G2P2002 for followup exam.  Former patient of Dr. Eda Paschal several issues noted below.  Past medical history,surgical history, problem list, medications, allergies, family history and social history were all reviewed and documented in the EPIC chart.  ROS:  Performed and pertinent positives and negatives are included in the history, assessment and plan .  Exam: Kim assistant Filed Vitals:   01/26/13 1601  BP: 120/76  Height: 5\' 7"  (1.702 m)  Weight: 157 lb (71.215 kg)   General appearance  Normal Skin grossly normal Head/Neck normal with no cervical or supraclavicular adenopathy thyroid normal Lungs  clear Cardiac RR, without RMG Abdominal  soft, nontender, without masses, organomegaly or hernia Breasts  examined lying and sitting without masses, retractions, discharge or axillary adenopathy. Pelvic  Ext/BUS/vagina  atrophic genital changes, first to second degree cystocele, first degree rectocele, cuff well supported  Adnexa  Without masses or tenderness    Anus and perineum  Normal   Rectovaginal  Normal sphincter tone without palpated masses or tenderness.    Assessment/Plan:  67 y.o. [id number] female for followup exam.   1. Postmenopausal/atrophic genital changes/ERT. Patient on estradiol 0.0375 patches. Has tried stopping but has unacceptable hot flashes sweats and not feeling well.  I reviewed the whole issue of HRT with her to include the WHI study with increased risk of stroke, heart attack, DVT and breast cancer. The ACOG and NAMS statements for lowest dose for the shortest period of time reviewed. Transdermal versus oral first-pass effect benefit discussed.  After lengthy discussion the patient wants to continue understanding the issues of using estrogen as she ages. I refilled her estradiol patches 0.0375x1 year. 2. Cystocele/rectocele. Patient is asymptomatic from these and is not want intervention. We've discussed  options to include observation, pessary and surgery. Patient will followup as needed. 3. Detrusor instability. Patient saw Dr. Terrilee Files who started her on Myrbitrique and she has done well with this and wants to continue. She'll followup with him as needed.  4. Osteopenia. DEXA 12/2009 with T score -1.6 FRAX 9.3%/1.1%. Repeat DEXA now she agrees to arrange. Increase calcium vitamin D reviewed. 5. Pap smear 2012. No Pap smear done today. No history of significant abnormal Pap smears previously. Status post hysterectomy for benign indications. Over the age of 67. Options to stop screening altogether reviewed and she is comfortable with this. 6. Mammography 12/2012. Continue with annual mammography. SBE monthly reviewed. 7. Colonoscopy 2004. Patient has a personal history of colon cancer and realizes she is way overdue for colonoscopy. She agrees to call and schedule now. 8. Health maintenance. No blood work done as this is done through her primary physician's office. Followup one year, sooner as needed.   Note: This document was prepared with digital dictation and possible smart phrase technology. Any transcriptional errors that result from this process are unintentional.   Dara Lords MD, 5:20 PM 01/26/2013

## 2013-01-26 NOTE — Patient Instructions (Signed)
Followup in one year for GYN exam, sooner as needed. Schedule your colonoscopy.

## 2013-02-02 ENCOUNTER — Encounter: Payer: Self-pay | Admitting: Obstetrics and Gynecology

## 2013-03-08 ENCOUNTER — Encounter: Payer: Self-pay | Admitting: Gynecology

## 2013-03-08 ENCOUNTER — Ambulatory Visit (INDEPENDENT_AMBULATORY_CARE_PROVIDER_SITE_OTHER): Payer: Medicare Other

## 2013-03-08 DIAGNOSIS — M899 Disorder of bone, unspecified: Secondary | ICD-10-CM

## 2013-03-08 DIAGNOSIS — M949 Disorder of cartilage, unspecified: Secondary | ICD-10-CM

## 2013-03-08 DIAGNOSIS — M8580 Other specified disorders of bone density and structure, unspecified site: Secondary | ICD-10-CM

## 2013-03-09 ENCOUNTER — Telehealth: Payer: Self-pay | Admitting: *Deleted

## 2013-03-09 NOTE — Telephone Encounter (Signed)
PA faex to [facility] for estradiol 0.0375 mg once patch weekly, will wait for approval.

## 2013-03-10 NOTE — Telephone Encounter (Signed)
Medication was approved through 03/08/14, pharmacy informed.

## 2013-07-11 ENCOUNTER — Other Ambulatory Visit: Payer: Self-pay | Admitting: Dermatology

## 2013-12-12 ENCOUNTER — Encounter: Payer: Self-pay | Admitting: Gynecology

## 2014-01-27 ENCOUNTER — Ambulatory Visit (INDEPENDENT_AMBULATORY_CARE_PROVIDER_SITE_OTHER): Payer: Medicare Other | Admitting: Gynecology

## 2014-01-27 ENCOUNTER — Encounter: Payer: Self-pay | Admitting: Gynecology

## 2014-01-27 VITALS — BP 120/76 | Ht 67.0 in | Wt 158.0 lb

## 2014-01-27 DIAGNOSIS — M8580 Other specified disorders of bone density and structure, unspecified site: Secondary | ICD-10-CM

## 2014-01-27 DIAGNOSIS — N952 Postmenopausal atrophic vaginitis: Secondary | ICD-10-CM

## 2014-01-27 DIAGNOSIS — N8111 Cystocele, midline: Secondary | ICD-10-CM

## 2014-01-27 DIAGNOSIS — Z1159 Encounter for screening for other viral diseases: Secondary | ICD-10-CM

## 2014-01-27 DIAGNOSIS — N318 Other neuromuscular dysfunction of bladder: Secondary | ICD-10-CM

## 2014-01-27 DIAGNOSIS — N3281 Overactive bladder: Secondary | ICD-10-CM

## 2014-01-27 DIAGNOSIS — N951 Menopausal and female climacteric states: Secondary | ICD-10-CM

## 2014-01-27 DIAGNOSIS — N816 Rectocele: Secondary | ICD-10-CM

## 2014-01-27 LAB — HEPATITIS C ANTIBODY: HCV AB: NEGATIVE

## 2014-01-27 MED ORDER — ESTRADIOL 0.0375 MG/24HR TD PTWK: 0.0375 mg | MEDICATED_PATCH | TRANSDERMAL | Status: DC

## 2014-01-27 NOTE — Patient Instructions (Signed)
You may obtain a copy of any labs that were done today by logging onto MyChart as outlined in the instructions provided with your AVS (after visit summary). The office will not call with normal lab results but certainly if there are any significant abnormalities then we will contact you.   Health Maintenance, Female A healthy lifestyle and preventative care can promote health and wellness.  Maintain regular health, dental, and eye exams.  Eat a healthy diet. Foods like vegetables, fruits, whole grains, low-fat dairy products, and lean protein foods contain the nutrients you need without too many calories. Decrease your intake of foods high in solid fats, added sugars, and salt. Get information about a proper diet from your caregiver, if necessary.  Regular physical exercise is one of the most important things you can do for your health. Most adults should get at least 150 minutes of moderate-intensity exercise (any activity that increases your heart rate and causes you to sweat) each week. In addition, most adults need muscle-strengthening exercises on 2 or more days a week.   Maintain a healthy weight. The body mass index (BMI) is a screening tool to identify possible weight problems. It provides an estimate of body fat based on height and weight. Your caregiver can help determine your BMI, and can help you achieve or maintain a healthy weight. For adults 20 years and older:  A BMI below 18.5 is considered underweight.  A BMI of 18.5 to 24.9 is normal.  A BMI of 25 to 29.9 is considered overweight.  A BMI of 30 and above is considered obese.  Maintain normal blood lipids and cholesterol by exercising and minimizing your intake of saturated fat. Eat a balanced diet with plenty of fruits and vegetables. Blood tests for lipids and cholesterol should begin at age 61 and be repeated every 5 years. If your lipid or cholesterol levels are high, you are over 50, or you are a high risk for heart  disease, you may need your cholesterol levels checked more frequently.Ongoing high lipid and cholesterol levels should be treated with medicines if diet and exercise are not effective.  If you smoke, find out from your caregiver how to quit. If you do not use tobacco, do not start.  Lung cancer screening is recommended for adults aged 33 80 years who are at high risk for developing lung cancer because of a history of smoking. Yearly low-dose computed tomography (CT) is recommended for people who have at least a 30-pack-year history of smoking and are a current smoker or have quit within the past 15 years. A pack year of smoking is smoking an average of 1 pack of cigarettes a day for 1 year (for example: 1 pack a day for 30 years or 2 packs a day for 15 years). Yearly screening should continue until the smoker has stopped smoking for at least 15 years. Yearly screening should also be stopped for people who develop a health problem that would prevent them from having lung cancer treatment.  If you are pregnant, do not drink alcohol. If you are breastfeeding, be very cautious about drinking alcohol. If you are not pregnant and choose to drink alcohol, do not exceed 1 drink per day. One drink is considered to be 12 ounces (355 mL) of beer, 5 ounces (148 mL) of wine, or 1.5 ounces (44 mL) of liquor.  Avoid use of street drugs. Do not share needles with anyone. Ask for help if you need support or instructions about stopping  the use of drugs.  High blood pressure causes heart disease and increases the risk of stroke. Blood pressure should be checked at least every 1 to 2 years. Ongoing high blood pressure should be treated with medicines, if weight loss and exercise are not effective.  If you are 59 to 69 years old, ask your caregiver if you should take aspirin to prevent strokes.  Diabetes screening involves taking a blood sample to check your fasting blood sugar level. This should be done once every 3  years, after age 91, if you are within normal weight and without risk factors for diabetes. Testing should be considered at a younger age or be carried out more frequently if you are overweight and have at least 1 risk factor for diabetes.  Breast cancer screening is essential preventative care for women. You should practice "breast self-awareness." This means understanding the normal appearance and feel of your breasts and may include breast self-examination. Any changes detected, no matter how small, should be reported to a caregiver. Women in their 66s and 30s should have a clinical breast exam (CBE) by a caregiver as part of a regular health exam every 1 to 3 years. After age 101, women should have a CBE every year. Starting at age 100, women should consider having a mammogram (breast X-ray) every year. Women who have a family history of breast cancer should talk to their caregiver about genetic screening. Women at a high risk of breast cancer should talk to their caregiver about having an MRI and a mammogram every year.  Breast cancer gene (BRCA)-related cancer risk assessment is recommended for women who have family members with BRCA-related cancers. BRCA-related cancers include breast, ovarian, tubal, and peritoneal cancers. Having family members with these cancers may be associated with an increased risk for harmful changes (mutations) in the breast cancer genes BRCA1 and BRCA2. Results of the assessment will determine the need for genetic counseling and BRCA1 and BRCA2 testing.  The Pap test is a screening test for cervical cancer. Women should have a Pap test starting at age 57. Between ages 25 and 35, Pap tests should be repeated every 2 years. Beginning at age 37, you should have a Pap test every 3 years as long as the past 3 Pap tests have been normal. If you had a hysterectomy for a problem that was not cancer or a condition that could lead to cancer, then you no longer need Pap tests. If you are  between ages 50 and 76, and you have had normal Pap tests going back 10 years, you no longer need Pap tests. If you have had past treatment for cervical cancer or a condition that could lead to cancer, you need Pap tests and screening for cancer for at least 20 years after your treatment. If Pap tests have been discontinued, risk factors (such as a new sexual partner) need to be reassessed to determine if screening should be resumed. Some women have medical problems that increase the chance of getting cervical cancer. In these cases, your caregiver may recommend more frequent screening and Pap tests.  The human papillomavirus (HPV) test is an additional test that may be used for cervical cancer screening. The HPV test looks for the virus that can cause the cell changes on the cervix. The cells collected during the Pap test can be tested for HPV. The HPV test could be used to screen women aged 44 years and older, and should be used in women of any age  who have unclear Pap test results. After the age of 55, women should have HPV testing at the same frequency as a Pap test.  Colorectal cancer can be detected and often prevented. Most routine colorectal cancer screening begins at the age of 44 and continues through age 20. However, your caregiver may recommend screening at an earlier age if you have risk factors for colon cancer. On a yearly basis, your caregiver may provide home test kits to check for hidden blood in the stool. Use of a small camera at the end of a tube, to directly examine the colon (sigmoidoscopy or colonoscopy), can detect the earliest forms of colorectal cancer. Talk to your caregiver about this at age 86, when routine screening begins. Direct examination of the colon should be repeated every 5 to 10 years through age 13, unless early forms of pre-cancerous polyps or small growths are found.  Hepatitis C blood testing is recommended for all people born from [birth year] through 1966 and any  individual with known risks for hepatitis C.  Practice safe sex. Use condoms and avoid high-risk sexual practices to reduce the spread of sexually transmitted infections (STIs). Sexually active women aged 36 and younger should be checked for Chlamydia, which is a common sexually transmitted infection. Older women with new or multiple partners should also be tested for Chlamydia. Testing for other STIs is recommended if you are sexually active and at increased risk.  Osteoporosis is a disease in which the bones lose minerals and strength with aging. This can result in serious bone fractures. The risk of osteoporosis can be identified using a bone density scan. Women ages 20 and over and women at risk for fractures or osteoporosis should discuss screening with their caregivers. Ask your caregiver whether you should be taking a calcium supplement or vitamin D to reduce the rate of osteoporosis.  Menopause can be associated with physical symptoms and risks. Hormone replacement therapy is available to decrease symptoms and risks. You should talk to your caregiver about whether hormone replacement therapy is right for you.  Use sunscreen. Apply sunscreen liberally and repeatedly throughout the day. You should seek shade when your shadow is shorter than you. Protect yourself by wearing long sleeves, pants, a wide-brimmed hat, and sunglasses year round, whenever you are outdoors.  Notify your caregiver of new moles or changes in moles, especially if there is a change in shape or color. Also notify your caregiver if a mole is larger than the size of a pencil eraser.  Stay current with your immunizations. Document Released: 08/12/2010 Document Revised: 05/24/2012 Document Reviewed: 08/12/2010 [facility] Patient Information 2014 [employer].

## 2014-01-27 NOTE — Progress Notes (Signed)
Lindsay Herman 29-Jul-1945 [id number]        68 y.o.  [id number] for follow up exam. Several issues noted below.  Past medical history,surgical history, problem list, medications, allergies, family history and social history were all reviewed and documented as reviewed in the EPIC chart.  ROS:  Performed with pertinent positives and negatives included in the history, assessment and plan.   Additional significant findings :  none   Exam: Kim Counsellor Vitals:   01/27/14 0818  BP: 120/76  Height: 5\' 7"  (1.702 m)  Weight: 158 lb (71.668 kg)   General appearance:  Normal affect, orientation and appearance. Skin: Grossly normal HEENT: Without gross lesions.  No cervical or supraclavicular adenopathy. Thyroid normal.  Lungs:  Clear without wheezing, rales or rhonchi Cardiac: RR, without RMG Abdominal:  Soft, nontender, without masses, guarding, rebound, organomegaly or hernia Breasts:  Examined lying and sitting without masses, retractions, discharge or axillary adenopathy. Pelvic:  Ext/BUS/vagina with generalized atrophic changes. First-degree cystocele. Mild rectocele. Cuff well supported  Adnexa  Without masses or tenderness    Anus and perineum  Normal   Rectovaginal  Normal sphincter tone without palpated masses or tenderness.    Assessment/Plan:  68 y.o. [id number] female for follow up exam.   1. Postmenopausal/atrophic genital changes/HRT. Patient on Climara 0.0375 patch weekly. Doing well. Tried stopping with hot flashes. I again reviewed the issues of HRT to include increased risk of stroke or DVT and breast cancer. Alternatives also reviewed. Patient feels very strongly that she wants to continue. She understands clearly the risks and I refilled her 1 year. 2. Osteopenia. DEXA 02/2013 T score -2.0 FRAX 11%/2%. Reports normal/high vitamin D through her primary physician's office. Plan repeat DEXA at two-year interval.  3. Detrusor instability. Is on Myrbetriq doing well. Check  urinalysis today. 4. Cystocele/rectocele. Patient without symptoms of pressure urinary or rectal issues. Will continue to monitor. Stable from prior exam. 5. Pap smear 2012. No Pap smear done today. No history of significant abnormal Pap smears. Status post hysterectomy for benign indications. Per current screening guidelines we both agree with stop screening as she is over the age of 58 and status post hysterectomy. 6. Mammography 12/2013. Continue with annual mammography. SBE monthly reviewed. 7. Colonoscopy 2015. Repeat at their recommended interval. 8. Health maintenance. No routine blood work drawn as this is done at her primary physician's office. I did order a hepatitis C screening as she is within the age range recommended by the CDC. She reports having received Tdap, influenza, Pneumovax and Zostavax vaccinations.  Follow up 1 year, sooner as needed.     [name] MD, 8:44 AM 01/27/2014

## 2014-01-30 ENCOUNTER — Encounter: Payer: Self-pay | Admitting: Gynecology

## 2014-02-27 ENCOUNTER — Other Ambulatory Visit: Payer: Self-pay | Admitting: Gynecology

## 2014-04-11 ENCOUNTER — Telehealth: Payer: Self-pay | Admitting: *Deleted

## 2014-04-11 NOTE — Telephone Encounter (Signed)
Prior authorization form filled out and faxed to [facility], will wait for response.

## 2014-04-12 NOTE — Telephone Encounter (Signed)
Prior authorization estradiol 0.0375 mg approved through 02/10/15

## 2014-04-28 ENCOUNTER — Ambulatory Visit (HOSPITAL_COMMUNITY)
Admission: RE | Admit: 2014-04-28 | Discharge: 2014-04-28 | Disposition: A | Discharge status: Home / Self Care | Payer: Medicare Other | Source: Ambulatory Visit | Attending: Cardiovascular Disease | Admitting: Cardiovascular Disease

## 2014-04-28 ENCOUNTER — Other Ambulatory Visit (HOSPITAL_COMMUNITY): Payer: Self-pay | Admitting: Rheumatology

## 2014-04-28 DIAGNOSIS — M79662 Pain in left lower leg: Secondary | ICD-10-CM | POA: Diagnosis present

## 2014-04-28 NOTE — Progress Notes (Signed)
Left Lower Ext. Venous Duplex Completed. Negative for DVT, SVT or Baker's Cyst in the left lower extremity. Oda Cogan, BS, RDMS, RVT

## 2015-01-12 ENCOUNTER — Encounter: Payer: Self-pay | Admitting: Gynecology

## 2015-01-29 ENCOUNTER — Encounter: Payer: Self-pay | Admitting: Gynecology

## 2015-01-29 ENCOUNTER — Ambulatory Visit (INDEPENDENT_AMBULATORY_CARE_PROVIDER_SITE_OTHER): Payer: Medicare Other | Admitting: Gynecology

## 2015-01-29 VITALS — BP 124/78 | Ht 67.0 in | Wt 161.0 lb

## 2015-01-29 DIAGNOSIS — N952 Postmenopausal atrophic vaginitis: Secondary | ICD-10-CM | POA: Diagnosis not present

## 2015-01-29 DIAGNOSIS — Z79890 Hormone replacement therapy (postmenopausal): Secondary | ICD-10-CM | POA: Diagnosis not present

## 2015-01-29 DIAGNOSIS — M8580 Other specified disorders of bone density and structure, unspecified site: Secondary | ICD-10-CM | POA: Diagnosis not present

## 2015-01-29 DIAGNOSIS — N816 Rectocele: Secondary | ICD-10-CM

## 2015-01-29 DIAGNOSIS — N8111 Cystocele, midline: Secondary | ICD-10-CM | POA: Diagnosis not present

## 2015-01-29 DIAGNOSIS — Z01419 Encounter for gynecological examination (general) (routine) without abnormal findings: Secondary | ICD-10-CM

## 2015-01-29 MED ORDER — ESTRADIOL 0.0375 MG/24HR TD PTWK
0.0375 mg | MEDICATED_PATCH | TRANSDERMAL | Status: DC
Start: 2015-01-29 — End: 2016-02-14

## 2015-01-29 NOTE — Progress Notes (Signed)
Lindsay Herman 01/14/1946 [id number]        69 y.o.  [id number]  for breast and pelvic exam. Several issues noted below.  Past medical history,surgical history, problem list, medications, allergies, family history and social history were all reviewed and documented as reviewed in the EPIC chart.  ROS:  Performed with pertinent positives and negatives included in the history, assessment and plan.   Additional significant findings :  none   Exam: Kim Counsellor Vitals:   01/29/15 1019  BP: 124/78  Height: 5\' 7"  (1.702 m)  Weight: 161 lb (73.029 kg)   General appearance:  Normal affect, orientation and appearance. Skin: Grossly normal HEENT: Without gross lesions.  No cervical or supraclavicular adenopathy. Thyroid normal.  Lungs:  Clear without wheezing, rales or rhonchi Cardiac: RR, without RMG Abdominal:  Soft, nontender, without masses, guarding, rebound, organomegaly or hernia Breasts:  Examined lying and sitting without masses, retractions, discharge or axillary adenopathy. Pelvic:  Ext/BUS/vagina with atrophic changes. Second-degree cystocele.  Mild rectocele. Cuff well supported.  Adnexa  Without masses or tenderness    Anus and perineum  Normal   Rectovaginal  Normal sphincter tone without palpated masses or tenderness.    Assessment/Plan:  69 y.o. [id number] female for breast and pelvic exam.   1. Postmenopausal/atrophic genital changes/HRT. Status post TVH LSO.  Patient continues on Climara 0.0375 patch weekly. Has tried stopping with unacceptable hot flushes. Received notice from Medicare that they will not pay for this and suggested alternative such as vaginal estrogen cream. She is not having issues with vaginitis but more systemic and I do not feel cream as an equivalent. Options to include weaning now versus continuing discussed. Current recommendations as far as lowest dose for shortest period of time also reviewed. Risks again discussed to include stroke heart attack  DVT and breast cancer. Possible increased risk with age reviewed. At this point I refilled her 1 year and she is going to decide as far as whether she is going to try to wean or not. 2. Osteopenia. DEXA 2015 T score -2.0 FRAX 11%/2%. Repeat DEXA this coming year at two-year interval and she's going to schedule. 3. Detrusor instability. Patient on Mybetriq doing well. She will call me if she needs a refill. Check urinalysis today. 4. Cystocele/rectocele. Overall stable and asymptomatic to the patient. We'll continue to monitor and report any issues. 5. Pap smear 2012. No Pap smear done today. No history of significant abnormal Pap smears. Status post hysterectomy for benign indications. We both agree to stop screening per current screening guidelines. 6. Mammography 12/2014. Continue with annual mammography when due. SBE monthly reviewed. 7. Colonoscopy 2015. Repeat at their recommended interval. 8. Health maintenance. No routine lab work done as this is done at her primary physician's office. Follow up 1 year, sooner as needed.   [name] MD, 10:44 AM 01/29/2015

## 2015-01-29 NOTE — Patient Instructions (Addendum)
Follow up for your bone density as scheduled.  You may obtain a copy of any labs that were done today by logging onto MyChart as outlined in the instructions provided with your AVS (after visit summary). The office will not call with normal lab results but certainly if there are any significant abnormalities then we will contact you.   Health Maintenance Adopting a healthy lifestyle and getting preventive care can go a long way to promote health and wellness. Talk with your health care provider about what schedule of regular examinations is right for you. This is a good chance for you to check in with your provider about disease prevention and staying healthy. In between checkups, there are plenty of things you can do on your own. Experts have done a lot of research about which lifestyle changes and preventive measures are most likely to keep you healthy. Ask your health care provider for more information. WEIGHT AND DIET  Eat a healthy diet  Be sure to include plenty of vegetables, fruits, low-fat dairy products, and lean protein.  Do not eat a lot of foods high in solid fats, added sugars, or salt.  Get regular exercise. This is one of the most important things you can do for your health.  Most adults should exercise for at least 150 minutes each week. The exercise should increase your heart rate and make you sweat (moderate-intensity exercise).  Most adults should also do strengthening exercises at least twice a week. This is in addition to the moderate-intensity exercise.  Maintain a healthy weight  Body mass index (BMI) is a measurement that can be used to identify possible weight problems. It estimates body fat based on height and weight. Your health care provider can help determine your BMI and help you achieve or maintain a healthy weight.  For females 28 years of age and older:   A BMI below 18.5 is considered underweight.  A BMI of 18.5 to 24.9 is normal.  A BMI of 25 to 29.9  is considered overweight.  A BMI of 30 and above is considered obese.  Watch levels of cholesterol and blood lipids  You should start having your blood tested for lipids and cholesterol at 70 years of age, then have this test every 5 years.  You may need to have your cholesterol levels checked more often if:  Your lipid or cholesterol levels are high.  You are older than 70 years of age.  You are at high risk for heart disease.  CANCER SCREENING   Lung Cancer  Lung cancer screening is recommended for adults 57-67 years old who are at high risk for lung cancer because of a history of smoking.  A yearly low-dose CT scan of the lungs is recommended for people who:  Currently smoke.  Have quit within the past 15 years.  Have at least a 30-pack-year history of smoking. A pack year is smoking an average of one pack of cigarettes a day for 1 year.  Yearly screening should continue until it has been 15 years since you quit.  Yearly screening should stop if you develop a health problem that would prevent you from having lung cancer treatment.  Breast Cancer  Practice breast self-awareness. This means understanding how your breasts normally appear and feel.  It also means doing regular breast self-exams. Let your health care provider know about any changes, no matter how small.  If you are in your 20s or 30s, you should have a clinical breast  exam (CBE) by a health care provider every 1-3 years as part of a regular health exam.  If you are 36 or older, have a CBE every year. Also consider having a breast X-ray (mammogram) every year.  If you have a family history of breast cancer, talk to your health care provider about genetic screening.  If you are at high risk for breast cancer, talk to your health care provider about having an MRI and a mammogram every year.  Breast cancer gene (BRCA) assessment is recommended for women who have family members with BRCA-related cancers.  BRCA-related cancers include:  Breast.  Ovarian.  Tubal.  Peritoneal cancers.  Results of the assessment will determine the need for genetic counseling and BRCA1 and BRCA2 testing. Cervical Cancer Routine pelvic examinations to screen for cervical cancer are no longer recommended for nonpregnant women who are considered low risk for cancer of the pelvic organs (ovaries, uterus, and vagina) and who do not have symptoms. A pelvic examination may be necessary if you have symptoms including those associated with pelvic infections. Ask your health care provider if a screening pelvic exam is right for you.   The Pap test is the screening test for cervical cancer for women who are considered at risk.  If you had a hysterectomy for a problem that was not cancer or a condition that could lead to cancer, then you no longer need Pap tests.  If you are older than 65 years, and you have had normal Pap tests for the past 10 years, you no longer need to have Pap tests.  If you have had past treatment for cervical cancer or a condition that could lead to cancer, you need Pap tests and screening for cancer for at least 20 years after your treatment.  If you no longer get a Pap test, assess your risk factors if they change (such as having a new sexual partner). This can affect whether you should start being screened again.  Some women have medical problems that increase their chance of getting cervical cancer. If this is the case for you, your health care provider may recommend more frequent screening and Pap tests.  The human papillomavirus (HPV) test is another test that may be used for cervical cancer screening. The HPV test looks for the virus that can cause cell changes in the cervix. The cells collected during the Pap test can be tested for HPV.  The HPV test can be used to screen women 8 years of age and older. Getting tested for HPV can extend the interval between normal Pap tests from three to  five years.  An HPV test also should be used to screen women of any age who have unclear Pap test results.  After 70 years of age, women should have HPV testing as often as Pap tests.  Colorectal Cancer  This type of cancer can be detected and often prevented.  Routine colorectal cancer screening usually begins at 70 years of age and continues through 69 years of age.  Your health care provider may recommend screening at an earlier age if you have risk factors for colon cancer.  Your health care provider may also recommend using home test kits to check for hidden blood in the stool.  A small camera at the end of a tube can be used to examine your colon directly (sigmoidoscopy or colonoscopy). This is done to check for the earliest forms of colorectal cancer.  Routine screening usually begins at age 26.  Direct examination of the colon should be repeated every 5-10 years through 70 years of age. However, you may need to be screened more often if early forms of precancerous polyps or small growths are found. Skin Cancer  Check your skin from head to toe regularly.  Tell your health care provider about any new moles or changes in moles, especially if there is a change in a mole's shape or color.  Also tell your health care provider if you have a mole that is larger than the size of a pencil eraser.  Always use sunscreen. Apply sunscreen liberally and repeatedly throughout the day.  Protect yourself by wearing long sleeves, pants, a wide-brimmed hat, and sunglasses whenever you are outside. HEART DISEASE, DIABETES, AND HIGH BLOOD PRESSURE   Have your blood pressure checked at least every 1-2 years. High blood pressure causes heart disease and increases the risk of stroke.  If you are between 76 years and 55 years old, ask your health care provider if you should take aspirin to prevent strokes.  Have regular diabetes screenings. This involves taking a blood sample to check your  fasting blood sugar level.  If you are at a normal weight and have a low risk for diabetes, have this test once every three years after 70 years of age.  If you are overweight and have a high risk for diabetes, consider being tested at a younger age or more often. PREVENTING INFECTION  Hepatitis B  If you have a higher risk for hepatitis B, you should be screened for this virus. You are considered at high risk for hepatitis B if:  You were born in a country where hepatitis B is common. Ask your health care provider which countries are considered high risk.  Your parents were born in a high-risk country, and you have not been immunized against hepatitis B (hepatitis B vaccine).  You have HIV or AIDS.  You use needles to inject street drugs.  You live with someone who has hepatitis B.  You have had sex with someone who has hepatitis B.  You get hemodialysis treatment.  You take certain medicines for conditions, including cancer, organ transplantation, and autoimmune conditions. Hepatitis C  Blood testing is recommended for:  Everyone born from [birth year] through 1966.  Anyone with known risk factors for hepatitis C. Sexually transmitted infections (STIs)  You should be screened for sexually transmitted infections (STIs) including gonorrhea and chlamydia if:  You are sexually active and are younger than 70 years of age.  You are older than 70 years of age and your health care provider tells you that you are at risk for this type of infection.  Your sexual activity has changed since you were last screened and you are at an increased risk for chlamydia or gonorrhea. Ask your health care provider if you are at risk.  If you do not have HIV, but are at risk, it may be recommended that you take a prescription medicine daily to prevent HIV infection. This is called pre-exposure prophylaxis (PrEP). You are considered at risk if:  You are sexually active and do not regularly use condoms or  know the HIV status of your partner(s).  You take drugs by injection.  You are sexually active with a partner who has HIV. Talk with your health care provider about whether you are at high risk of being infected with HIV. If you choose to begin PrEP, you should first be tested for HIV. You should then be  tested every 3 months for as long as you are taking PrEP.  PREGNANCY   If you are premenopausal and you may become pregnant, ask your health care provider about preconception counseling.  If you may become pregnant, take 400 to 800 micrograms (mcg) of folic acid every day.  If you want to prevent pregnancy, talk to your health care provider about birth control (contraception). OSTEOPOROSIS AND MENOPAUSE   Osteoporosis is a disease in which the bones lose minerals and strength with aging. This can result in serious bone fractures. Your risk for osteoporosis can be identified using a bone density scan.  If you are 25 years of age or older, or if you are at risk for osteoporosis and fractures, ask your health care provider if you should be screened.  Ask your health care provider whether you should take a calcium or vitamin D supplement to lower your risk for osteoporosis.  Menopause may have certain physical symptoms and risks.  Hormone replacement therapy may reduce some of these symptoms and risks. Talk to your health care provider about whether hormone replacement therapy is right for you.  HOME CARE INSTRUCTIONS   Schedule regular health, dental, and eye exams.  Stay current with your immunizations.   Do not use any tobacco products including cigarettes, chewing tobacco, or electronic cigarettes.  If you are pregnant, do not drink alcohol.  If you are breastfeeding, limit how much and how often you drink alcohol.  Limit alcohol intake to no more than 1 drink per day for nonpregnant women. One drink equals 12 ounces of beer, 5 ounces of wine, or 1 ounces of hard liquor.  Do  not use street drugs.  Do not share needles.  Ask your health care provider for help if you need support or information about quitting drugs.  Tell your health care provider if you often feel depressed.  Tell your health care provider if you have ever been abused or do not feel safe at home. Document Released: 08/12/2010 Document Revised: 06/13/2013 Document Reviewed: 12/29/2012 [facility] Patient Information 2015 [place], [place]. This information is not intended to replace advice given to you by your health care provider. Make sure you discuss any questions you have with your health care provider.

## 2015-01-30 LAB — URINALYSIS W MICROSCOPIC + REFLEX CULTURE
BILIRUBIN URINE: NEGATIVE
Bacteria, UA: NONE SEEN [HPF]
CASTS: NONE SEEN [LPF]
Crystals: NONE SEEN [HPF]
GLUCOSE, UA: NEGATIVE
HGB URINE DIPSTICK: NEGATIVE
KETONES UR: NEGATIVE
Leukocytes, UA: NEGATIVE
NITRITE: NEGATIVE
PH: 6 (ref 5.0–8.0)
Protein, ur: NEGATIVE
SPECIFIC GRAVITY, URINE: 1.014 (ref 1.001–1.035)
WBC UA: NONE SEEN WBC/HPF (ref <=5)
Yeast: NONE SEEN [HPF]

## 2015-01-31 LAB — URINE CULTURE
Colony Count: NO GROWTH
Organism ID, Bacteria: NO GROWTH

## 2015-06-11 DIAGNOSIS — M8580 Other specified disorders of bone density and structure, unspecified site: Secondary | ICD-10-CM

## 2015-06-11 HISTORY — DX: Other specified disorders of bone density and structure, unspecified site: M85.80

## 2015-06-12 ENCOUNTER — Encounter: Payer: Self-pay | Admitting: Gynecology

## 2015-06-12 ENCOUNTER — Ambulatory Visit (INDEPENDENT_AMBULATORY_CARE_PROVIDER_SITE_OTHER): Payer: Medicare Other

## 2015-06-12 ENCOUNTER — Other Ambulatory Visit: Payer: Self-pay | Admitting: Gynecology

## 2015-06-12 DIAGNOSIS — M899 Disorder of bone, unspecified: Secondary | ICD-10-CM

## 2015-06-12 DIAGNOSIS — M8580 Other specified disorders of bone density and structure, unspecified site: Secondary | ICD-10-CM

## 2015-10-24 ENCOUNTER — Telehealth: Payer: Self-pay | Admitting: *Deleted

## 2015-10-24 MED ORDER — MIRABEGRON ER 50 MG PO TB24
50.0000 mg | ORAL_TABLET | Freq: Every day | ORAL | 11 refills | Status: DC
Start: 2015-10-24 — End: 2016-02-29

## 2015-10-24 NOTE — Telephone Encounter (Signed)
Pt called requesting refill on Myrbetriq, no longer seeing urology. Told to call when refills needed. Myrbetriq 25 mg ?

## 2015-10-24 NOTE — Telephone Encounter (Signed)
Okay to refill Myrbetriq current dose. Enough to last through next annual exam

## 2015-10-24 NOTE — Telephone Encounter (Signed)
Pt takes 50 mg, Rx sent until annual.

## 2016-01-04 ENCOUNTER — Encounter: Payer: Self-pay | Admitting: Gynecology

## 2016-01-30 ENCOUNTER — Encounter: Payer: Medicare Other | Admitting: Gynecology

## 2016-02-01 DIAGNOSIS — R682 Dry mouth, unspecified: Secondary | ICD-10-CM | POA: Insufficient documentation

## 2016-02-01 DIAGNOSIS — I7300 Raynaud's syndrome without gangrene: Secondary | ICD-10-CM | POA: Insufficient documentation

## 2016-02-01 DIAGNOSIS — M19042 Primary osteoarthritis, left hand: Secondary | ICD-10-CM

## 2016-02-01 DIAGNOSIS — Z9889 Other specified postprocedural states: Secondary | ICD-10-CM | POA: Insufficient documentation

## 2016-02-01 DIAGNOSIS — M19041 Primary osteoarthritis, right hand: Secondary | ICD-10-CM | POA: Insufficient documentation

## 2016-02-01 DIAGNOSIS — M0580 Other rheumatoid arthritis with rheumatoid factor of unspecified site: Secondary | ICD-10-CM | POA: Insufficient documentation

## 2016-02-01 DIAGNOSIS — R768 Other specified abnormal immunological findings in serum: Secondary | ICD-10-CM | POA: Insufficient documentation

## 2016-02-01 DIAGNOSIS — M170 Bilateral primary osteoarthritis of knee: Secondary | ICD-10-CM | POA: Insufficient documentation

## 2016-02-01 DIAGNOSIS — M359 Systemic involvement of connective tissue, unspecified: Secondary | ICD-10-CM | POA: Insufficient documentation

## 2016-02-01 NOTE — Progress Notes (Signed)
Office Visit Note  Patient: Lindsay Herman             Date of Birth: 14-Apr-1945           MRN: [id number]             PCP: Geoffery Lyons, MD Referring: Burnard Bunting, MD Visit Date: 02/12/2016 Occupation: @GUAROCC @    Subjective:  No chief complaint on file. Follow-up on scleroderma, off of Plaquenil, OA of the hands and knee joint, possible macular degeneration.  History of Present Illness: Lindsay Herman is a 70 y.o. female  Last seen 08/06/2015. Patient is doing well with her scleroderma. No flare. Patient is off of Plaquenil due to concerns of macular degeneration. She saw Dr. Lucita Herman. She had right worse than left eye blurring in the periphery of her eyes bilaterally.  History of sclerodactyly, Raynaud's (no flare up), arthritis consistent with OA. Patient has been off of Plaquenil for some time now and doing well without it. Note the patient states "I cannot tell any difference in my symptoms while I have been off of Plaquenil.  History of OA of the knees but doing well at the moment. Only struggles with squatting and trying to get back up. At this time, patient does not need Visco supplementation but we did discuss it in detail in case patient needs it in the future.    Activities of Daily Living:  Patient reports morning stiffness for 15 minutes.   Patient Denies nocturnal pain.  Difficulty dressing/grooming: Denies Difficulty climbing stairs: Denies Difficulty getting out of chair: Denies Difficulty using hands for taps, buttons, cutlery, and/or writing: Denies   Review of Systems  Constitutional: Negative for fatigue.  HENT: Negative for mouth sores and mouth dryness.   Eyes: Negative for dryness.  Respiratory: Negative for shortness of breath.   Gastrointestinal: Negative for constipation and diarrhea.  Musculoskeletal: Negative for myalgias and myalgias.  Skin: Negative for sensitivity to sunlight.  Psychiatric/Behavioral: Negative for  decreased concentration and sleep disturbance.    PMFS History:  Patient Active Problem List   Diagnosis Date Noted  . Scleroderma, limited ([place]) 02/10/2016  . History of environmental allergies 02/10/2016  . Gastroesophageal reflux disease 02/10/2016  . History of colon cancer 02/10/2016  . Sicca syndrome 02/01/2016  . History of repair of right rotator cuff 02/01/2016  . Primary osteoarthritis of both knees 02/01/2016  . Primary osteoarthritis of both hands 02/01/2016  . Raynaud's disease without gangrene 02/01/2016  . Cystocele   . Atrophic vaginitis   . Osteopenia   . Menopausal symptoms     Past Medical History:  Diagnosis Date  . Atrophic vaginitis   . Cancer [facility])    Colon cancer  . Contact lens/glasses fitting    wears contacts or glasses  . Cystocele   . Osteopenia 06/2015   T score -1.7 FRAX 10%/1.8%  . PONV (postoperative nausea and vomiting)   . Raynaud's disease   . Rectocele   . Rosacea   . Scleroderma ([place])   . Urinary frequency     Family History  Problem Relation Age of Onset  . Diabetes Mother   . Heart disease Mother   . Stroke Mother   . Diabetes Brother   . Hypertension Brother   . Lupus Father    Past Surgical History:  Procedure Laterality Date  . APPENDECTOMY    . COLON RESECTION  1995  . MASS EXCISION Left 08/31/2012   Procedure: EXCISION MUCOID CYST DEBRIDEMENT DISTAL  INTERPHALANGEAL LEFT MIDDLE FINGER;  Surgeon: Wynonia Sours, MD;  Location: [facility];  Service: Orthopedics;  Laterality: Left;  . NOSE SURGERY    . OOPHORECTOMY     [facility]  . ROTATOR CUFF REPAIR    . TONSILLECTOMY    . VAGINAL HYSTERECTOMY  1994   Social History   Social History Narrative  . No narrative on file     Objective: Vital Signs: BP (!) 150/82 (BP Location: Left Arm, Patient Position: Sitting, Cuff Size: Normal)   Pulse 83   Resp 14   Ht 5\' 7"  (1.702 m)   Wt 166 lb (75.3 kg)   BMI 26.00 kg/m    Physical Exam  Constitutional: She  is oriented to person, place, and time. She appears well-developed and well-nourished.  HENT:  Head: Normocephalic and atraumatic.  Eyes: EOM are normal. Pupils are equal, round, and reactive to light.  Cardiovascular: Normal rate, regular rhythm and normal heart sounds.  Exam reveals no gallop and no friction rub.   No murmur heard. Pulmonary/Chest: Effort normal and breath sounds normal. She has no wheezes. She has no rales.  Abdominal: Soft. Bowel sounds are normal. She exhibits no distension. There is no tenderness. There is no guarding. No hernia.  Musculoskeletal: Normal range of motion. She exhibits no edema, tenderness or deformity.  Lymphadenopathy:    She has no cervical adenopathy.  Neurological: She is alert and oriented to person, place, and time. Coordination normal.  Skin: Skin is warm and dry. Capillary refill takes less than 2 seconds. No rash noted.  History of sclerodactyly only. Limited from distal fingers to PIP joints bilaterally.  Psychiatric: She has a normal mood and affect. Her behavior is normal.  Nursing note and vitals reviewed.    Musculoskeletal Exam:  Full range of motion of all joints except decreased range of motion of bilateral hands but has about 95% fist formation bilaterally. Grip strength is equal and strong bilaterally with 95% fist formation bilaterally. Fibromyalgia tender points are all absent  CDAI Exam: CDAI Homunculus Exam:   Joint Counts:  CDAI Tender Joint count: 0 CDAI Swollen Joint count: 0  Global Assessments:  Patient Global Assessment: 0 Provider Global Assessment: 0    Investigation: Findings:  2011 echocardiogram normal PFTs normal, DLCO normal    Imaging: No results found.  Speciality Comments: No specialty comments available.    Procedures:  No procedures performed Allergies: Codeine and Morphine and related   Assessment / Plan:     Visit Diagnoses: Scleroderma, limited ([facility]) - Hx sclerodactyly, Raynauds,  arthralgias, +ANA, +Ro, +La, +RF ; Off PLQ due to Macular Degenration Concern. - Plan: CBC with Differential/Platelet, COMPLETE METABOLIC PANEL WITH GFR, Urinalysis, Routine w reflex microscopic, Ambulatory referral to Cardiology  Raynaud's disease without gangrene  Sicca syndrome  Primary osteoarthritis of both hands  Primary osteoarthritis of both knees - Bilateral moderate with chondromalacia patella  History of repair of right rotator cuff  History of environmental allergies - And asthma  Gastroesophageal reflux disease  History of colon cancer - 1995, followed up by Dr. Oletta Lamas  High risk medications (not anticoagulants) long-term use - 08/06/2015: D/C Plaquenil due to concern of history macular degeneration.; will observe for now;    Plan: #1: Limited scleroderma. History of positive ANA, positive Ro, positive La, positive rheumatoid factor. No flare. I will refer her to Dr. Haroldine Laws on for evaluation of pulmonary hypertension. She can get echocardiogram and PFTs there. Off of Plaquenil. Due to  concerns of macular degeneration. I will check CBC, comprehensive and UA today as she has history of scleroderma. She's been also advised to monitor her blood pressure closely.  #2: OA of the hands. Ongoing stiffness. Affecting DIP and PIP joints bilaterally. No synovitis.  #3: OA of the knee joint. Mild discomfort. Discussed Visco supplementation in the future but patient does not require any intervention with Visco at this time.  #4: I will try to get a letter from Dr. Lucita Herman regarding his last notes regarding the patient's eye exam for records. ( Request office notes from Dr. Lucita Herman. According to our notes, patient has macular degeneration. Patient states that she does not have macular degeneration, she has Plaquenil toxicity.  The last 2 office notes from Dr. Lucita Herman should help [place] understand patient's diagnoses.)  #5: Plan: Stay off of Plaquenil for now since  patient is asymptomatic.   Orders: Orders Placed This Encounter  Procedures  . CBC with Differential/Platelet  . COMPLETE METABOLIC PANEL WITH GFR  . Urinalysis, Routine w reflex microscopic  . Ambulatory referral to Cardiology   No orders of the defined types were placed in this encounter.   Face-to-face time spent with patient was 30 minutes. 50% of time was spent in counseling and coordination of care.  Follow-Up Instructions: Return in about 6 months (around 08/11/2016) for Scleroderma, Sclerodactyly, Raynauds, Off PLQ;.   Bo Merino, MD

## 2016-02-10 DIAGNOSIS — M349 Systemic sclerosis, unspecified: Secondary | ICD-10-CM | POA: Insufficient documentation

## 2016-02-10 DIAGNOSIS — Z9109 Other allergy status, other than to drugs and biological substances: Secondary | ICD-10-CM | POA: Insufficient documentation

## 2016-02-10 DIAGNOSIS — Z85038 Personal history of other malignant neoplasm of large intestine: Secondary | ICD-10-CM | POA: Insufficient documentation

## 2016-02-10 DIAGNOSIS — K219 Gastro-esophageal reflux disease without esophagitis: Secondary | ICD-10-CM | POA: Insufficient documentation

## 2016-02-12 ENCOUNTER — Encounter: Payer: Self-pay | Admitting: Rheumatology

## 2016-02-12 ENCOUNTER — Ambulatory Visit (INDEPENDENT_AMBULATORY_CARE_PROVIDER_SITE_OTHER): Payer: Medicare Other | Admitting: Rheumatology

## 2016-02-12 VITALS — BP 150/82 | HR 83 | Resp 14 | Ht 67.0 in | Wt 166.0 lb

## 2016-02-12 DIAGNOSIS — Z85038 Personal history of other malignant neoplasm of large intestine: Secondary | ICD-10-CM | POA: Diagnosis not present

## 2016-02-12 DIAGNOSIS — R682 Dry mouth, unspecified: Secondary | ICD-10-CM

## 2016-02-12 DIAGNOSIS — M349 Systemic sclerosis, unspecified: Secondary | ICD-10-CM | POA: Diagnosis not present

## 2016-02-12 DIAGNOSIS — I7300 Raynaud's syndrome without gangrene: Secondary | ICD-10-CM | POA: Diagnosis not present

## 2016-02-12 DIAGNOSIS — Z79899 Other long term (current) drug therapy: Secondary | ICD-10-CM

## 2016-02-12 DIAGNOSIS — M170 Bilateral primary osteoarthritis of knee: Secondary | ICD-10-CM

## 2016-02-12 DIAGNOSIS — M19041 Primary osteoarthritis, right hand: Secondary | ICD-10-CM

## 2016-02-12 DIAGNOSIS — K219 Gastro-esophageal reflux disease without esophagitis: Secondary | ICD-10-CM | POA: Diagnosis not present

## 2016-02-12 DIAGNOSIS — Z9109 Other allergy status, other than to drugs and biological substances: Secondary | ICD-10-CM

## 2016-02-12 DIAGNOSIS — Z9889 Other specified postprocedural states: Secondary | ICD-10-CM | POA: Diagnosis not present

## 2016-02-12 DIAGNOSIS — M19042 Primary osteoarthritis, left hand: Secondary | ICD-10-CM | POA: Diagnosis not present

## 2016-02-12 LAB — CBC WITH DIFFERENTIAL/PLATELET
BASOS ABS: 0 {cells}/uL (ref 0–200)
BASOS PCT: 0 %
EOS PCT: 1 %
Eosinophils Absolute: 70 cells/uL (ref 15–500)
HCT: 43.9 % (ref 35.0–45.0)
HEMOGLOBIN: 13.9 g/dL (ref 11.7–15.5)
LYMPHS ABS: 1260 {cells}/uL (ref 850–3900)
Lymphocytes Relative: 18 %
MCH: 29.2 pg (ref 27.0–33.0)
MCHC: 31.7 g/dL — ABNORMAL LOW (ref 32.0–36.0)
MCV: 92.2 fL (ref 80.0–100.0)
MPV: 10.6 fL (ref 7.5–12.5)
Monocytes Absolute: 980 cells/uL — ABNORMAL HIGH (ref 200–950)
Monocytes Relative: 14 %
NEUTROS ABS: 4690 {cells}/uL (ref 1500–7800)
Neutrophils Relative %: 67 %
PLATELETS: 331 10*3/uL (ref 140–400)
RBC: 4.76 MIL/uL (ref 3.80–5.10)
RDW: 13.4 % (ref 11.0–15.0)
WBC: 7 10*3/uL (ref 3.8–10.8)

## 2016-02-12 NOTE — Patient Instructions (Signed)
Please request records from Dr. Lucita Ferrara

## 2016-02-13 LAB — URINALYSIS, ROUTINE W REFLEX MICROSCOPIC
BILIRUBIN URINE: NEGATIVE
GLUCOSE, UA: NEGATIVE
HGB URINE DIPSTICK: NEGATIVE
Ketones, ur: NEGATIVE
Nitrite: NEGATIVE
PH: 5.5 (ref 5.0–8.0)
PROTEIN: NEGATIVE
Specific Gravity, Urine: 1.027 (ref 1.001–1.035)

## 2016-02-13 LAB — COMPLETE METABOLIC PANEL WITH GFR
ALBUMIN: 4.1 g/dL (ref 3.6–5.1)
ALK PHOS: 61 U/L (ref 33–130)
ALT: 24 U/L (ref 6–29)
AST: 24 U/L (ref 10–35)
BILIRUBIN TOTAL: 0.5 mg/dL (ref 0.2–1.2)
BUN: 11 mg/dL (ref 7–25)
CO2: 25 mmol/L (ref 20–31)
Calcium: 9.2 mg/dL (ref 8.6–10.4)
Chloride: 104 mmol/L (ref 98–110)
Creat: 0.72 mg/dL (ref 0.60–0.93)
GFR, Est African American: >89 mL/min (ref >=60)
GFR, Est Non African American: 85 mL/min (ref >=60)
Glucose, Bld: 98 mg/dL (ref 65–99)
Potassium: 4.8 mmol/L (ref 3.5–5.3)
Sodium: 139 mmol/L (ref 135–146)
TOTAL PROTEIN: 6.7 g/dL (ref 6.1–8.1)

## 2016-02-13 LAB — URINALYSIS, MICROSCOPIC ONLY
BACTERIA UA: NONE SEEN [HPF]
Casts: NONE SEEN [LPF]
Crystals: NONE SEEN [HPF]
YEAST: NONE SEEN [HPF]

## 2016-02-13 NOTE — Progress Notes (Signed)
Labs within normal limits

## 2016-02-14 ENCOUNTER — Other Ambulatory Visit: Payer: Self-pay | Admitting: Gynecology

## 2016-02-14 ENCOUNTER — Telehealth: Payer: Self-pay | Admitting: *Deleted

## 2016-02-14 NOTE — Telephone Encounter (Signed)
Prior Authorization done online via cover my meds. Com for climara patch 0.0375 mg weekly, will wait for response.

## 2016-02-14 NOTE — Telephone Encounter (Signed)
Patch approved estradiol 0.0375 mg through 02/09/17

## 2016-02-21 ENCOUNTER — Other Ambulatory Visit: Payer: Self-pay | Admitting: Rheumatology

## 2016-02-21 DIAGNOSIS — M349 Systemic sclerosis, unspecified: Secondary | ICD-10-CM

## 2016-02-21 NOTE — Progress Notes (Signed)
Have gotten a message from cardiology referral for Dr Sung Amabile is through heart failure clinic, not cardiology

## 2016-02-29 ENCOUNTER — Encounter: Payer: Self-pay | Admitting: Gynecology

## 2016-02-29 ENCOUNTER — Ambulatory Visit (INDEPENDENT_AMBULATORY_CARE_PROVIDER_SITE_OTHER): Payer: Medicare Other | Admitting: Gynecology

## 2016-02-29 VITALS — BP 142/90 | Ht 67.0 in | Wt 165.0 lb

## 2016-02-29 DIAGNOSIS — Z01411 Encounter for gynecological examination (general) (routine) with abnormal findings: Secondary | ICD-10-CM | POA: Diagnosis not present

## 2016-02-29 DIAGNOSIS — N816 Rectocele: Secondary | ICD-10-CM

## 2016-02-29 DIAGNOSIS — Z79890 Hormone replacement therapy (postmenopausal): Secondary | ICD-10-CM

## 2016-02-29 DIAGNOSIS — N8111 Cystocele, midline: Secondary | ICD-10-CM

## 2016-02-29 DIAGNOSIS — M8580 Other specified disorders of bone density and structure, unspecified site: Secondary | ICD-10-CM | POA: Diagnosis not present

## 2016-02-29 MED ORDER — MIRABEGRON ER 50 MG PO TB24: 50.0000 mg | ORAL_TABLET | Freq: Every day | ORAL | 11 refills | Status: DC

## 2016-02-29 NOTE — Patient Instructions (Signed)

## 2016-02-29 NOTE — Progress Notes (Signed)
    Lindsay Herman 05/21/1945 [id number]        70 y.o.  G2P2002 for annual exam.    Past medical history,surgical history, problem list, medications, allergies, family history and social history were all reviewed and documented as reviewed in the EPIC chart.  ROS:  Performed with pertinent positives and negatives included in the history, assessment and plan.   Additional significant findings :  None   Exam: Caryn Bee assistant Vitals:   02/29/16 1202  BP: (!) 142/90  Weight: 165 lb (74.8 kg)  Height: 5\' 7"  (1.702 m)   Body mass index is 25.84 kg/m.  General appearance:  Normal affect, orientation and appearance. Skin: Grossly normal HEENT: Without gross lesions.  No cervical or supraclavicular adenopathy. Thyroid normal.  Lungs:  Clear without wheezing, rales or rhonchi Cardiac: RR, without RMG Abdominal:  Soft, nontender, without masses, guarding, rebound, organomegaly or hernia Breasts:  Examined lying and sitting without masses, retractions, discharge or axillary adenopathy. Pelvic:  Ext, BUS, Vagina with atrophic changes.  Second degree cystocele. Mild rectocele. Cuff well supported.  Adnexa without masses or tenderness    Anus and perineum normal   Rectovaginal normal sphincter tone without palpated masses or tenderness.    Assessment/Plan:  71 y.o. G6P2002 female for annual exam status post TVH LSO in the past..   1. Postmenopausal/atrophic genital changes/HRT. Patient was on Climara 0.0375 patches. Decided to stop them recently and so far is doing well without significant menopausal symptoms. We discussed HRT and the risks versus benefits. Increased risk of stroke heart attack DVT possible breast cancer versus symptom relief and possible cardiovascular/bone health and started early also reviewed. At this point patient wants to stay off of HRT but will call if she starts to redevelop significant menopausal symptoms and wants to reinitiate. 2. Osteopenia. DEXA 06/2015 T  score -1.7 FRAX 10%/1.8%. Will repeat at 2 year interval. Increased calcium vitamin D. 3. Pelvic relaxation with cystocele/rectocele. Patient doing well without symptoms. Stable over serial exams. Will monitor with annual exams for now. 4. Mammography 12/2015. Continue with annual mammography when due. SBE monthly reviewed. 5. Pap smear 2012. No Pap smear done today. Per current screening guidelines we both agree to stop screening based on age and hysterectomy history. No history of significant abnormal Pap smears previously. 6. Colonoscopy 2015. Repeat at their recommended interval. 7. Detrusor instability. Continues on myrbetriq and wants to continue. Doing well without side effects. Refill 1 year provided. 8. Health maintenance. No routine lab work done as this is done elsewhere. Blood pressure today 142/90 reviewed with the patient. She is able to monitor her blood pressure is going to do so. It remains elevated she knows to follow up with Dr. Reynaldo Minium her primary physician. Otherwise follow up with me in one year, sooner as needed.   [name] MD, 12:26 PM 02/29/2016

## 2016-03-05 ENCOUNTER — Telehealth (HOSPITAL_COMMUNITY): Payer: Self-pay | Admitting: *Deleted

## 2016-03-05 DIAGNOSIS — M349 Systemic sclerosis, unspecified: Secondary | ICD-10-CM

## 2016-03-05 NOTE — Telephone Encounter (Signed)
Received referral from Dr Estanislado Pandy for pt to see Dr Haroldine Laws for scleroderma.  Per Dr Haroldine Laws pt will need echo and pfts prior to appt, orders placed

## 2016-04-16 ENCOUNTER — Ambulatory Visit (HOSPITAL_COMMUNITY)
Admission: RE | Admit: 2016-04-16 | Discharge: 2016-04-16 | Disposition: A | Discharge status: Home / Self Care | Payer: Medicare Other | Source: Ambulatory Visit | Attending: Internal Medicine | Admitting: Internal Medicine

## 2016-04-16 DIAGNOSIS — M349 Systemic sclerosis, unspecified: Secondary | ICD-10-CM | POA: Insufficient documentation

## 2016-04-16 DIAGNOSIS — R942 Abnormal results of pulmonary function studies: Secondary | ICD-10-CM | POA: Insufficient documentation

## 2016-04-16 DIAGNOSIS — I340 Nonrheumatic mitral (valve) insufficiency: Secondary | ICD-10-CM | POA: Insufficient documentation

## 2016-04-16 DIAGNOSIS — I501 Left ventricular failure: Secondary | ICD-10-CM | POA: Insufficient documentation

## 2016-04-16 LAB — PULMONARY FUNCTION TEST
DL/VA % pred: 79 %
DL/VA: 4.09 ml/min/mmHg/L
DLCO UNC: 20.08 ml/min/mmHg
DLCO unc % pred: 70 %
FEF 25-75 POST: 4.81 L/s
FEF 25-75 Pre: 4.26 L/sec
FEF2575-%Change-Post: 12 %
FEF2575-%Pred-Post: 239 %
FEF2575-%Pred-Pre: 211 %
FEV1-%Change-Post: 1 %
FEV1-%PRED-POST: 116 %
FEV1-%PRED-PRE: 113 %
FEV1-POST: 2.92 L
FEV1-Pre: 2.87 L
FEV1FVC-%Change-Post: 1 %
FEV1FVC-%Pred-Pre: 120 %
FEV6-%Change-Post: 0 %
FEV6-%PRED-PRE: 98 %
FEV6-%Pred-Post: 98 %
FEV6-POST: 3.12 L
FEV6-Pre: 3.14 L
FEV6FVC-%PRED-POST: 104 %
FEV6FVC-%Pred-Pre: 104 %
FVC-%Change-Post: 0 %
FVC-%PRED-PRE: 94 %
FVC-%Pred-Post: 94 %
FVC-POST: 3.14 L
FVC-PRE: 3.14 L
POST FEV6/FVC RATIO: 100 %
PRE FEV1/FVC RATIO: 91 %
Post FEV1/FVC ratio: 93 %
Pre FEV6/FVC Ratio: 100 %
RV % pred: 99 %
RV: 2.36 L
TLC % PRED: 98 %
TLC: 5.41 L

## 2016-04-16 MED ORDER — ALBUTEROL SULFATE (2.5 MG/3ML) 0.083% IN NEBU
2.5000 mg | INHALATION_SOLUTION | Freq: Once | RESPIRATORY_TRACT | Status: AC
  Administered 2016-04-16: 2.5 mg via RESPIRATORY_TRACT

## 2016-04-16 NOTE — Progress Notes (Signed)
Echocardiogram 2D Echocardiogram has been performed.  Lindsay Herman 04/16/2016, 2:39 PM

## 2016-04-21 ENCOUNTER — Ambulatory Visit: Payer: Medicare Other | Admitting: Physician Assistant

## 2016-04-23 ENCOUNTER — Encounter (HOSPITAL_COMMUNITY): Payer: Self-pay | Admitting: Internal Medicine

## 2016-04-23 ENCOUNTER — Ambulatory Visit (HOSPITAL_COMMUNITY)
Admission: RE | Admit: 2016-04-23 | Discharge: 2016-04-23 | Disposition: A | Discharge status: Home / Self Care | Payer: Medicare Other | Source: Ambulatory Visit | Attending: Internal Medicine | Admitting: Internal Medicine

## 2016-04-23 VITALS — BP 124/80 | HR 80 | Wt 166.0 lb

## 2016-04-23 DIAGNOSIS — Z87891 Personal history of nicotine dependence: Secondary | ICD-10-CM | POA: Diagnosis not present

## 2016-04-23 DIAGNOSIS — L719 Rosacea, unspecified: Secondary | ICD-10-CM | POA: Insufficient documentation

## 2016-04-23 DIAGNOSIS — Z9221 Personal history of antineoplastic chemotherapy: Secondary | ICD-10-CM | POA: Insufficient documentation

## 2016-04-23 DIAGNOSIS — M8580 Other specified disorders of bone density and structure, unspecified site: Secondary | ICD-10-CM | POA: Insufficient documentation

## 2016-04-23 DIAGNOSIS — Z9049 Acquired absence of other specified parts of digestive tract: Secondary | ICD-10-CM | POA: Diagnosis not present

## 2016-04-23 DIAGNOSIS — M349 Systemic sclerosis, unspecified: Secondary | ICD-10-CM | POA: Diagnosis not present

## 2016-04-23 DIAGNOSIS — Z85038 Personal history of other malignant neoplasm of large intestine: Secondary | ICD-10-CM | POA: Diagnosis present

## 2016-04-23 DIAGNOSIS — I7300 Raynaud's syndrome without gangrene: Secondary | ICD-10-CM | POA: Insufficient documentation

## 2016-04-23 DIAGNOSIS — R0602 Shortness of breath: Secondary | ICD-10-CM | POA: Diagnosis not present

## 2016-04-23 DIAGNOSIS — N8110 Cystocele, unspecified: Secondary | ICD-10-CM | POA: Diagnosis not present

## 2016-04-23 DIAGNOSIS — H579 Unspecified disorder of eye and adnexa: Secondary | ICD-10-CM | POA: Insufficient documentation

## 2016-04-23 NOTE — Progress Notes (Addendum)
ADV[facility]ED [facility] CLINIC CONSULT NOTE  Referring Physician: Patrecia Pour PCP: Reynaldo Minium  HPI:  Lindsay Herman is a 71 y/o woman with h/o probable limited scleroderma referred by Dr. Patrecia Pour for screening for  [facility]  Denies h/o known heart or lung problems. Had colon cancer in 1995 with resection and chemotherapy.  Lost her dog about 9 months ago and not walking as much. Gets SOB with walking hills. No CP or edema. No orthopnea or PND. Does silver sneakers and yoga as well. No problems with swallowing. Has had normal CXR with PCP. Off plaquenil due to eye problems for the last year.   PFTs 04/16/16 FEV1 2.87 (113%) FVC 3.14 (94%) DLCO  70%  Echo 04/16/16 EF 65-70% Grade I DD. RV norma. No evidence of PAH. IVC small    Review of Systems: [y] = yes, [ ]  = no   General: Weight gain [ ] ; Weight loss [ ] ; Anorexia [ ] ; Fatigue [ ] ; Fever [ ] ; Chills [ ] ; Weakness [ ]   Cardiac: Chest pain/pressure [ ] ; Resting SOB [ ] ; Exertional SOB Blue.Reese ]; Orthopnea [ ] ; Pedal Edema [ ] ; Palpitations [ ] ; Syncope [ ] ; Presyncope [ ] ; Paroxysmal nocturnal dyspnea[ ]   Pulmonary: Cough [ ] ; Wheezing[ ] ; Hemoptysis[ ] ; Sputum [ ] ; Snoring [ ]   GI: Vomiting[ ] ; Dysphagia[ ] ; Melena[ ] ; Hematochezia [ ] ; Heartburn[ ] ; Abdominal pain [ ] ; Constipation [ ] ; Diarrhea [ ] ; BRBPR [ ]   GU: Hematuria[ ] ; Dysuria [ ] ; Nocturia[ ]   Vascular: Pain in legs with walking [ ] ; Pain in feet with lying flat [ ] ; Non-healing sores [ ] ; Stroke [ ] ; TIA [ ] ; Slurred speech [ ] ;  Neuro: Headaches[ ] ; Vertigo[ ] ; Seizures[ ] ; Paresthesias[ ] ;Blurred vision [ ] ; Diplopia [ ] ; Vision changes [ ]   Ortho/Skin: Arthritis [ y]; Joint pain Blue.Reese ]; Muscle pain [ ] ; Joint swelling [ ] ; Back Pain [ ] ; Rash [ ]   Psych: Depression[ ] ; Anxiety[ ]   Heme: Bleeding problems [ ] ; Clotting disorders [ ] ; Anemia [ ]   Endocrine: Diabetes [ ] ; Thyroid dysfunction[ ]    Past Medical History:  Diagnosis Date  . Atrophic vaginitis   . Cancer [facility])    Colon cancer  .  Contact lens/glasses fitting    wears contacts or glasses  . Cystocele   . Osteopenia 06/2015   T score -1.7 FRAX 10%/1.8%  . PONV (postoperative nausea and vomiting)   . Raynaud's disease   . Rectocele   . Rosacea   . Scleroderma ([place])   . Urinary frequency     Current Outpatient Prescriptions  Medication Sig Dispense Refill  . doxycycline (ORACEA) 40 MG capsule Take 40 mg by mouth every morning.    . mirabegron ER (MYRBETRIQ) 50 MG TB24 tablet Take 1 tablet (50 mg total) by mouth daily. 30 tablet 11   No current facility-administered medications for this encounter.     Allergies  Allergen Reactions  . Codeine Nausea And Vomiting  . Morphine And Related Nausea And Vomiting      Social History   Social History  . Marital status: Married    Spouse name: N/A  . Number of children: N/A  . Years of education: N/A   Occupational History  . Not on file.   Social History Main Topics  . Smoking status: Former Smoker    Years: 3.00    Types: Cigarettes    Quit date: 08/25/1972  . Smokeless tobacco: Never Used  . Alcohol use 3.0  oz/week    5 Standard drinks or equivalent per week  . Drug use: No  . Sexual activity: No     Comment: 1st intercourse 71 yo-Fewer than 5 partners   Other Topics Concern  . Not on file   Social History Narrative  . No narrative on file      Family History  Problem Relation Age of Onset  . Diabetes Mother   . Heart disease Mother   . Stroke Mother   . Diabetes Brother   . Hypertension Brother   . Lupus Father     Vitals:   04/23/16 1117  BP: 124/80  Pulse: 80  SpO2: 97%  Weight: 166 lb (75.3 kg)    PHYSICAL EXAM: General:  Well appearing. No respiratory difficulty HEENT: normal Neck: supple. no JVD. Carotids 2+ bilat; no bruits. No lymphadenopathy or thryomegaly appreciated. Cor: PMI nondisplaced. Regular rate & rhythm. No rubs, gallops or murmurs. Lungs: clear. Mildly decreased throughout Abdomen: soft, nontender,  nondistended. No hepatosplenomegaly. No bruits or masses. Good bowel sounds. Extremities: no cyanosis, clubbing, rash, edema  Mild tightness around fingers Neuro: alert & oriented x 3, cranial nerves grossly intact. moves all 4 extremities w/o difficulty. Affect pleasant.  ASSESSMENT & PLAN: 1. Scleroderma  --Echo and PFTs reviewed personally. DLCO just minimally decreased. No evidence of PAH on echo. --Has had CXR with PCP and no evidence of ILD by her report - Will repeat screening in 2 years.   Orlandus Borowski,MD 12:17 PM

## 2016-07-30 ENCOUNTER — Ambulatory Visit (INDEPENDENT_AMBULATORY_CARE_PROVIDER_SITE_OTHER): Payer: Medicare Other | Admitting: Orthopedic Surgery

## 2016-09-15 ENCOUNTER — Other Ambulatory Visit: Payer: Self-pay | Admitting: Orthopedic Surgery

## 2016-09-15 DIAGNOSIS — M25511 Pain in right shoulder: Secondary | ICD-10-CM

## 2016-09-21 ENCOUNTER — Ambulatory Visit
Admission: RE | Admit: 2016-09-21 | Discharge: 2016-09-21 | Disposition: A | Discharge status: Home / Self Care | Payer: Medicare Other | Source: Ambulatory Visit | Attending: Orthopedic Surgery | Admitting: Orthopedic Surgery

## 2016-09-21 DIAGNOSIS — M25511 Pain in right shoulder: Secondary | ICD-10-CM

## 2016-09-28 ENCOUNTER — Ambulatory Visit
Admission: RE | Admit: 2016-09-28 | Discharge: 2016-09-28 | Disposition: A | Discharge status: Home / Self Care | Payer: Medicare Other | Source: Ambulatory Visit | Attending: Orthopedic Surgery | Admitting: Orthopedic Surgery

## 2016-10-05 ENCOUNTER — Other Ambulatory Visit: Payer: Self-pay | Admitting: Obstetrics and Gynecology

## 2016-10-05 MED ORDER — SULFAMETHOXAZOLE-TRIMETHOPRIM 800-160 MG PO TABS: 1.0000 | ORAL_TABLET | Freq: Two times a day (BID) | ORAL | 0 refills | Status: DC

## 2016-10-05 MED ORDER — PHENAZOPYRIDINE HCL 200 MG PO TABS: 200.0000 mg | ORAL_TABLET | Freq: Three times a day (TID) | ORAL | 0 refills | Status: DC | PRN

## 2016-10-05 NOTE — Progress Notes (Signed)
The patient c/o urinary frequency, urgency, and dysuria. No fevers, no flank pain. Will treat with Bactrim and pyridium. She was instructed to call if she develops fevers, flank pains, or any other concerns. She will also call if she isn't feeling better in the next 24-48 hours.  CC: Dr Phineas Real

## 2016-12-02 ENCOUNTER — Ambulatory Visit: Payer: Medicare Other | Admitting: Podiatry

## 2016-12-04 ENCOUNTER — Ambulatory Visit (INDEPENDENT_AMBULATORY_CARE_PROVIDER_SITE_OTHER): Payer: Medicare Other

## 2016-12-04 ENCOUNTER — Encounter: Payer: Self-pay | Admitting: Podiatry

## 2016-12-04 ENCOUNTER — Ambulatory Visit (INDEPENDENT_AMBULATORY_CARE_PROVIDER_SITE_OTHER): Payer: Medicare Other | Admitting: Podiatry

## 2016-12-04 VITALS — BP 116/83 | HR 78 | Resp 16

## 2016-12-04 DIAGNOSIS — M2042 Other hammer toe(s) (acquired), left foot: Secondary | ICD-10-CM | POA: Diagnosis not present

## 2016-12-04 NOTE — Progress Notes (Signed)
Subjective:  Patient ID: Lindsay Herman, female    DOB: 08/14/1945,  MRN: [id number] HPI Chief Complaint  Patient presents with  . Toe Pain    2nd toe left (medial) - red, callused area since May 2018, keeping cotton between toes, using neosporin and bandaid    71 y.o. female presents with the above complaint.     Past Medical History:  Diagnosis Date  . Atrophic vaginitis   . Cancer [facility])    Colon cancer  . Contact lens/glasses fitting    wears contacts or glasses  . Cystocele   . Osteopenia 06/2015   T score -1.7 FRAX 10%/1.8%  . PONV (postoperative nausea and vomiting)   . Raynaud's disease   . Rectocele   . Rosacea   . Scleroderma ([place])   . Urinary frequency    Past Surgical History:  Procedure Laterality Date  . APPENDECTOMY    . COLON RESECTION  1995  . MASS EXCISION Left 08/31/2012   Procedure: EXCISION MUCOID CYST DEBRIDEMENT DISTAL INTERPHALANGEAL LEFT MIDDLE FINGER;  Surgeon: Wynonia Sours, MD;  Location: [facility];  Service: Orthopedics;  Laterality: Left;  . NOSE SURGERY    . OOPHORECTOMY     LSO  . ROTATOR CUFF REPAIR    . TONSILLECTOMY    . VAGINAL HYSTERECTOMY  1994    Current Outpatient Prescriptions:  .  gabapentin (NEURONTIN) 100 MG capsule, Take 100 mg by mouth at bedtime., Disp: , Rfl:  .  LUTEIN PO, Take by mouth., Disp: , Rfl:  .  doxycycline (ORACEA) 40 MG capsule, Take 40 mg by mouth every morning., Disp: , Rfl:  .  mirabegron ER (MYRBETRIQ) 50 MG TB24 tablet, Take 1 tablet (50 mg total) by mouth daily., Disp: 30 tablet, Rfl: 11 .  rosuvastatin (CRESTOR) 20 MG tablet, Take 20 mg by mouth once a week., Disp: , Rfl: 0  Allergies  Allergen Reactions  . Codeine Nausea And Vomiting  . Morphine And Related Nausea And Vomiting   Review of Systems  All other systems reviewed and are negative.  Objective:   Vitals:   12/04/16 1009  BP: 116/83  Pulse: 78  Resp: 16    General: Well developed, nourished, in no acute  distress, alert and oriented x3   Dermatological: Skin is warm, dry and supple bilateral. Nails x 10 are well maintained; remaining integument appears unremarkable at this time. There are no open sores, no preulcerative lesions, no rash or signs of infection present. Small area of erythema and reactive hyperkeratosis is noted to the medial aspect of the PIPJ second digit left foot. Also some reactive hyperkeratosis to the lateral aspect of the hallux interphalangeal joint. No open lesions or wounds.  Vascular: Dorsalis Pedis artery and Posterior Tibial artery pedal pulses are 2/4 bilateral with immedate capillary fill time. Pedal hair growth present. No varicosities and no lower extremity edema present bilateral.   Neruologic: Grossly intact via light touch bilateral. Vibratory intact via tuning fork bilateral. Protective threshold with Semmes Wienstein monofilament intact to all pedal sites bilateral. Patellar and Achilles deep tendon reflexes 2+ bilateral. No Babinski or clonus noted bilateral.   Musculoskeletal: No gross boney pedal deformities bilateral. No pain, crepitus, or limitation noted with foot and ankle range of motion bilateral. Muscular strength 5/5 in all groups tested bilateral. Mild hammertoe deformity resulting in area reactive hyperkeratosis. Also appears to have fat pad atrophy bilateral aspect of the hallux.  Gait: Unassisted, Nonantalgic.    Radiographs:  Assessment & Plan:   Assessment: Hammertoe deformity with callus.  Plan: Provided her with silicone padding follow-up with [place] as needed for correction.     Max T. Heath, [place]

## 2017-02-20 ENCOUNTER — Encounter: Payer: Self-pay | Admitting: Gynecology

## 2017-03-02 ENCOUNTER — Ambulatory Visit: Payer: Medicare Other | Admitting: Gynecology

## 2017-03-02 ENCOUNTER — Encounter: Payer: Self-pay | Admitting: Gynecology

## 2017-03-02 VITALS — BP 122/76 | Ht 66.5 in | Wt 164.0 lb

## 2017-03-02 DIAGNOSIS — M8580 Other specified disorders of bone density and structure, unspecified site: Secondary | ICD-10-CM | POA: Diagnosis not present

## 2017-03-02 DIAGNOSIS — N3281 Overactive bladder: Secondary | ICD-10-CM | POA: Diagnosis not present

## 2017-03-02 DIAGNOSIS — Z01411 Encounter for gynecological examination (general) (routine) with abnormal findings: Secondary | ICD-10-CM | POA: Diagnosis not present

## 2017-03-02 DIAGNOSIS — N8189 Other female genital prolapse: Secondary | ICD-10-CM

## 2017-03-02 DIAGNOSIS — N952 Postmenopausal atrophic vaginitis: Secondary | ICD-10-CM

## 2017-03-02 DIAGNOSIS — Z79890 Hormone replacement therapy (postmenopausal): Secondary | ICD-10-CM

## 2017-03-02 MED ORDER — MIRABEGRON ER 50 MG PO TB24: 50.0000 mg | ORAL_TABLET | Freq: Every day | ORAL | 11 refills | Status: DC

## 2017-03-02 NOTE — Progress Notes (Signed)
    Lindsay Herman 1945-02-24 [id number]        71 y.o.  G2P2002 for annual gynecologic exam.  Several issues noted below.  Past medical history,surgical history, problem list, medications, allergies, family history and social history were all reviewed and documented as reviewed in the EPIC chart.  ROS:  Performed with pertinent positives and negatives included in the history, assessment and plan.   Additional significant findings : None   Exam: Caryn Bee assistant Vitals:   03/02/17 1529  BP: 122/76  Weight: 164 lb (74.4 kg)  Height: 5' 6.5" (1.689 m)   Body mass index is 26.07 kg/m.  General appearance:  Normal affect, orientation and appearance. Skin: Grossly normal HEENT: Without gross lesions.  No cervical or supraclavicular adenopathy. Thyroid normal.  Lungs:  Clear without wheezing, rales or rhonchi Cardiac: RR, without RMG Abdominal:  Soft, nontender, without masses, guarding, rebound, organomegaly or hernia Breasts:  Examined lying and sitting without masses, retractions, discharge or axillary adenopathy. Pelvic:  Ext, BUS, Vagina: With atrophic changes.  Second-degree cystocele.  Mild rectocele.  Cuff well supported  Adnexa: Without masses or tenderness    Anus and perineum: Normal   Rectovaginal: Normal sphincter tone without palpated masses or tenderness.    Assessment/Plan:  72 y.o. [id number] female for annual gynecologic exam.  1. Postmenopausal/atrophic genital changes.  Had been on Climara HRT but discontinued last year.  Doing well with some hot flushes and sweats but overall getting less.  We will continue to monitor for now she seems to be tolerating these well. 2. Osteopenia.  DEXA 2017 T score -1.7 FRAX 10% / 1.8%.  Repeat DEXA this coming spring at 2-year interval and patient will schedule in follow-up for this. 3. Pelvic relaxation with mild cystocele/rectocele.  Stable on serial exams.  Asymptomatic to the patient.  Continue to monitor with annual  exams.  Report any symptoms. 4. Detrusor instability.  Continues on Myrbetriq doing well.  Repeat refill times 1 year provided.  Check urine analysis today. 5. Mammography 02/2017.  Continue with annual mammography next year.  Breast exam normal today. 6. Pap smear 2012.  No Pap smear done today.  No history of significant abnormal Pap smears.  We both agreed to stop screening per current screening guidelines based on age and hysterectomy history. 7. Colonoscopy 2015.  Repeat at their recommended interval. 8. Health maintenance.  No routine lab work done as patient does this elsewhere.  Follow-up for bone density otherwise 1 year, sooner as needed. 9.    [name] MD, 3:54 PM 03/02/2017

## 2017-03-02 NOTE — Patient Instructions (Signed)
Followup for bone density as scheduled. 

## 2017-03-03 LAB — URINALYSIS W MICROSCOPIC + REFLEX CULTURE
Bacteria, UA: NONE SEEN /HPF
Bilirubin Urine: NEGATIVE
Glucose, UA: NEGATIVE
HYALINE CAST: NONE SEEN /LPF
Hgb urine dipstick: NEGATIVE
KETONES UR: NEGATIVE
Nitrites, Initial: NEGATIVE
Protein, ur: NEGATIVE
RBC / HPF: NONE SEEN /HPF (ref 0–2)
SPECIFIC GRAVITY, URINE: 1.026 (ref 1.001–1.03)

## 2017-06-15 ENCOUNTER — Encounter: Payer: Self-pay | Admitting: Gynecology

## 2017-06-15 ENCOUNTER — Other Ambulatory Visit: Payer: Self-pay | Admitting: Gynecology

## 2017-06-15 ENCOUNTER — Ambulatory Visit (INDEPENDENT_AMBULATORY_CARE_PROVIDER_SITE_OTHER): Payer: Medicare Other

## 2017-06-15 DIAGNOSIS — Z780 Asymptomatic menopausal state: Secondary | ICD-10-CM

## 2017-06-15 DIAGNOSIS — M8588 Other specified disorders of bone density and structure, other site: Secondary | ICD-10-CM

## 2017-06-15 DIAGNOSIS — M85852 Other specified disorders of bone density and structure, left thigh: Secondary | ICD-10-CM

## 2017-06-15 DIAGNOSIS — M8580 Other specified disorders of bone density and structure, unspecified site: Secondary | ICD-10-CM

## 2017-08-28 ENCOUNTER — Encounter: Payer: Self-pay | Admitting: Rheumatology

## 2017-09-16 ENCOUNTER — Encounter: Payer: Self-pay | Admitting: Internal Medicine

## 2017-09-16 LAB — IFOBT (OCCULT BLOOD): IMMUNOLOGICAL FECAL OCCULT BLOOD TEST: NEGATIVE

## 2017-10-01 NOTE — Progress Notes (Signed)
Office Visit Note  Patient: Lindsay Herman             Date of Birth: [birth year]           MRN: [id number]             PCP: Burnard Bunting, MD Referring: Burnard Bunting, MD Visit Date: 10/06/2017 Occupation: @GUAROCC @  Subjective:  Pain in hands.   History of Present Illness: Lindsay Herman is a 72 y.o. female with history of a scleroderma and osteoarthritis.  She states yesterday she started having some problems with pinched nerve in her cervical spine for which she was seen by Dr. Lynann Bologna.  She states after physical therapy the symptoms eased off.  She had history of her right rotator cuff tear repair in the past.  She also started hurting in her shoulder and went for follow-up visit.  Her x-rays were unremarkable.  She continues to have some discomfort in her right shoulder and difficulty sleeping on the right side.  She states his whole shoulder pain got better after taking Tylenol.  Recently she has had pain in her right forearm and her right wrist.  She is also had discomfort in her hands off and on.  She was seen by her PCP who felt that her symptoms may be neuropathy.  She went back to see Dr. Lynann Bologna who noticed some swelling on her wrist and did x-rays and told her that she had arthritis.  She continues to have some discomfort in her bilateral hands and bilateral wrist joints.  None of the other joints are painful.  Activities of Daily Living:  Patient reports morning stiffness for 0 minute.   Patient Reports nocturnal pain.  Difficulty dressing/grooming: Denies Difficulty climbing stairs: Denies Difficulty getting out of chair: Denies Difficulty using hands for taps, buttons, cutlery, and/or writing: Denies  Review of Systems  Constitutional: Positive for fatigue. Negative for night sweats, weight gain and weight loss.  HENT: Negative for mouth sores, trouble swallowing, trouble swallowing, mouth dryness and nose dryness.   Eyes: Negative for pain, redness, visual  disturbance and dryness.  Respiratory: Negative for cough, shortness of breath and difficulty breathing.   Cardiovascular: Negative for chest pain, palpitations, hypertension, irregular heartbeat and swelling in legs/feet.  Gastrointestinal: Negative for blood in stool, constipation and diarrhea.  Endocrine: Negative for increased urination.  Genitourinary: Negative for vaginal dryness.  Musculoskeletal: Positive for arthralgias, joint pain and joint swelling. Negative for myalgias, muscle weakness, morning stiffness, muscle tenderness and myalgias.  Skin: Positive for color change. Negative for rash, hair loss, skin tightness, ulcers and sensitivity to sunlight.  Allergic/Immunologic: Negative for susceptible to infections.  Neurological: Negative for dizziness, memory loss, night sweats and weakness.  Hematological: Negative for swollen glands.  Psychiatric/Behavioral: Positive for depressed mood. Negative for sleep disturbance. The patient is not nervous/anxious.     PMFS History:  Patient Active Problem List   Diagnosis Date Noted  . Scleroderma, limited ([place]) 02/10/2016  . History of environmental allergies 02/10/2016  . Gastroesophageal reflux disease 02/10/2016  . History of colon cancer 02/10/2016  . Sicca syndrome 02/01/2016  . History of repair of right rotator cuff 02/01/2016  . Primary osteoarthritis of both knees 02/01/2016  . Primary osteoarthritis of both hands 02/01/2016  . Raynaud's disease without gangrene 02/01/2016  . Cystocele   . Atrophic vaginitis   . Osteopenia   . Menopausal symptoms     Past Medical History:  Diagnosis Date  . Atrophic vaginitis   .  Cancer [facility])    Colon cancer  . Contact lens/glasses fitting    wears contacts or glasses  . Cystocele   . Osteopenia 06/2017   T score -2.0 FRAX 13% / 2.8%  . PONV (postoperative nausea and vomiting)   . Raynaud's disease   . Rectocele   . Rosacea   . Scleroderma ([place])   . Urinary frequency       Family History  Problem Relation Age of Onset  . Diabetes Mother   . Heart disease Mother   . Stroke Mother   . Diabetes Brother   . Hypertension Brother   . Lupus Father    Past Surgical History:  Procedure Laterality Date  . APPENDECTOMY    . COLON RESECTION  1995  . MASS EXCISION Left 08/31/2012   Procedure: EXCISION MUCOID CYST DEBRIDEMENT DISTAL INTERPHALANGEAL LEFT MIDDLE FINGER;  Surgeon: Wynonia Sours, MD;  Location: [place];  Service: Orthopedics;  Laterality: Left;  . NOSE SURGERY    . OOPHORECTOMY     [facility]  . ROTATOR CUFF REPAIR    . TONSILLECTOMY    . VAGINAL HYSTERECTOMY  1994   Social History   Social History Narrative  . Not on file    Objective: Vital Signs: BP 109/74 (BP Location: Left Arm, Patient Position: Sitting, Cuff Size: Normal)   Pulse 93   Resp 16   Ht 5' 6.75" (1.695 m)   Wt 165 lb 12.8 oz (75.2 kg)   BMI 26.16 kg/m    Physical Exam  Constitutional: She is oriented to person, place, and time. She appears well-developed and well-nourished.  HENT:  Head: Normocephalic and atraumatic.  Eyes: Conjunctivae and EOM are normal.  Neck: Normal range of motion.  Cardiovascular: Normal rate, regular rhythm, normal heart sounds and intact distal pulses.  Pulmonary/Chest: Effort normal and breath sounds normal.  Abdominal: Soft. Bowel sounds are normal.  Lymphadenopathy:    She has no cervical adenopathy.  Neurological: She is alert and oriented to person, place, and time.  Skin: Skin is warm and dry. Capillary refill takes less than 2 seconds.  Sclerodactyly  Psychiatric: She has a normal mood and affect. Her behavior is normal.  Nursing note and vitals reviewed.    Musculoskeletal Exam: C-spine thoracic lumbar spine good range of motion.  Shoulder joints elbow joints wrist joints MCPs were in good range of motion.  She has some synovitis over right wrist joint and right extensor tenosynovitis was noted.  Left wrist joint was  within normal limits.  DIP and PIP thickening was noted.  Sclerodactyly was noted.  Hip joints knee joints ankles MTPs PIPs were in good range of motion with no synovitis.   CDAI Exam: CDAI Score: Not documented Patient Global Assessment: Not documented; Provider Global Assessment: Not documented Swollen: Not documented; Tender: Not documented Joint Exam   Not documented   There is currently no information documented on the homunculus. Go to the Rheumatology activity and complete the homunculus joint exam.  Investigation: No additional findings.  Imaging: Xr Hand 2 View Left  Result Date: 10/06/2017 PIP/DIP and CMC narrowing was noted.  No MCP joint narrowing was noted.  No intercarpal radiocarpal joint space narrowing was noted. Impression: These findings are consistent with osteoarthritis of the hand.  Xr Hand 2 View Right  Result Date: 10/06/2017 CMC, PIP and DIP narrowing was noted.  No MCP joint narrowing, mild intercarpal joint space narrowing was noted.  Some cystic versus erosive changes were  noted in the right wrist carpal bones and possible in the orbital styloid. Impression: These findings are consistent with osteoarthritis and inflammatory arthritis.   Recent Labs: Lab Results  Component Value Date   WBC 7.0 02/12/2016   HGB 13.9 02/12/2016   PLT 331 02/12/2016   NA 139 02/12/2016   K 4.8 02/12/2016   CL 104 02/12/2016   CO2 25 02/12/2016   GLUCOSE 98 02/12/2016   BUN 11 02/12/2016   CREATININE 0.72 02/12/2016   BILITOT 0.5 02/12/2016   ALKPHOS 61 02/12/2016   AST 24 02/12/2016   ALT 24 02/12/2016   PROT 6.7 02/12/2016   ALBUMIN 4.1 02/12/2016   CALCIUM 9.2 02/12/2016   GFRAA >89 02/12/2016  August 28, 2017 labs from [facility] CBC normal, CMP normal, LDL 128  Speciality Comments: No specialty comments available.  Procedures:  No procedures performed Allergies: Codeine and Morphine and related   Assessment / Plan:     Visit Diagnoses:  Pain in both hands -patient has been having increased pain in her right hand for the last few months.  She had synovitis on examination of her right wrist joint.  Plan: XR Hand 2 View Left, XR Hand 2 View Right, the x-ray today reveal right wrist intercarpal and radiocarpal joint space narrowing and possible erosive changes.  I will obtain following labs today.  She is taking Plaquenil in the past but it was discontinued due to macular changes.  The options will be to consider methotrexate.  Detailed discussion regarding the indications side effects contraindications of methotrexate.  She will get a chance to review it until her follow-up appointment.  I will also obtain a baseline chest x-ray.  Rheumatoid factor, Sedimentation rate, Cyclic citrul peptide antibody, IgG, 14-3-3 eta Protein  Drug Counseling TB Gold: Pending Hepatitis panel: Pending  Chest-xray: Pending  Contraception: Not applicable  Alcohol use: Discussed  Patient was counseled on the purpose, proper use, and adverse effects of methotrexate including nausea, infection, and signs and symptoms of pneumonitis.  Reviewed instructions with patient to take methotrexate weekly along with folic acid daily.  Discussed the importance of frequent monitoring of kidney and liver function and blood counts, and provided patient with standing lab instructions.  Counseled patient to avoid NSAIDs and alcohol while on methotrexate.  Provided patient with educational materials on methotrexate and answered all questions.  Advised patient to get annual influenza vaccine and to get a pneumococcal vaccine if patient has not already had one.  Patient voiced understanding.  Patient consented to methotrexate use.  Will upload into chart.    High risk medication use - Plan: Hepatitis B core antibody, IgM, Hepatitis B surface antigen, Hepatitis C antibody, QuantiFERON-TB Gold Plus, Serum protein electrophoresis with reflex, IgG, IgA, IgM, HIV  antibody  Scleroderma, limited ([facility]) - Hx sclerodactyly, Raynauds, arthralgias, +ANA, +Ro, +La, +RF ; Off PLQ due to Macular Degenration Concern.  Raynaud's disease without gangrene-it is not very symptomatic currently.  Sicca syndrome-she continues to have dry mouth and dry eyes.  The symptoms are tolerable.  Primary osteoarthritis of both hands-she has some changes consistent with osteoarthritis.  Primary osteoarthritis of both knees-not symptomatic currently.  History of repair of right rotator cuff-she has discomfort off and on.  History of colon cancer - 1995, followed up by Dr. Oletta Lamas  Gastroesophageal reflux disease  History of environmental allergies - And asthma   Orders: Orders Placed This Encounter  Procedures  . XR Hand 2 View Left  . XR  Hand 2 View Right  . DG Chest 2 View  . Rheumatoid factor  . Sedimentation rate  . Cyclic citrul peptide antibody, IgG  . 14-3-3 eta Protein  . Hepatitis B core antibody, IgM  . Hepatitis B surface antigen  . Hepatitis C antibody  . QuantiFERON-TB Gold Plus  . Serum protein electrophoresis with reflex  . IgG, IgA, IgM  . HIV antibody   No orders of the defined types were placed in this encounter.   Face-to-face time spent with patient was 30 minutes. Greater than 50% of time was spent in counseling and coordination of care.  Follow-Up Instructions: Return for Scleroderma, Osteoarthritis.   Bo Merino, MD  Note - This record has been created using Editor, commissioning.  Chart creation errors have been sought, but may not always  have been located. Such creation errors do not reflect on  the standard of medical care.

## 2017-10-06 ENCOUNTER — Ambulatory Visit (HOSPITAL_COMMUNITY)
Admission: RE | Admit: 2017-10-06 | Discharge: 2017-10-06 | Disposition: A | Discharge status: Home / Self Care | Payer: Medicare Other | Source: Ambulatory Visit | Attending: Rheumatology | Admitting: Rheumatology

## 2017-10-06 ENCOUNTER — Ambulatory Visit (INDEPENDENT_AMBULATORY_CARE_PROVIDER_SITE_OTHER): Payer: Self-pay

## 2017-10-06 ENCOUNTER — Ambulatory Visit: Payer: Medicare Other | Admitting: Rheumatology

## 2017-10-06 ENCOUNTER — Encounter: Payer: Self-pay | Admitting: Rheumatology

## 2017-10-06 VITALS — BP 109/74 | HR 93 | Resp 16 | Ht 66.75 in | Wt 165.8 lb

## 2017-10-06 DIAGNOSIS — Z9109 Other allergy status, other than to drugs and biological substances: Secondary | ICD-10-CM

## 2017-10-06 DIAGNOSIS — M79642 Pain in left hand: Secondary | ICD-10-CM | POA: Diagnosis not present

## 2017-10-06 DIAGNOSIS — M79641 Pain in right hand: Secondary | ICD-10-CM

## 2017-10-06 DIAGNOSIS — I7300 Raynaud's syndrome without gangrene: Secondary | ICD-10-CM | POA: Diagnosis not present

## 2017-10-06 DIAGNOSIS — Z79899 Other long term (current) drug therapy: Secondary | ICD-10-CM | POA: Diagnosis not present

## 2017-10-06 DIAGNOSIS — J984 Other disorders of lung: Secondary | ICD-10-CM | POA: Diagnosis not present

## 2017-10-06 DIAGNOSIS — M170 Bilateral primary osteoarthritis of knee: Secondary | ICD-10-CM

## 2017-10-06 DIAGNOSIS — R682 Dry mouth, unspecified: Secondary | ICD-10-CM

## 2017-10-06 DIAGNOSIS — Z9889 Other specified postprocedural states: Secondary | ICD-10-CM

## 2017-10-06 DIAGNOSIS — M349 Systemic sclerosis, unspecified: Secondary | ICD-10-CM

## 2017-10-06 DIAGNOSIS — Z85038 Personal history of other malignant neoplasm of large intestine: Secondary | ICD-10-CM

## 2017-10-06 DIAGNOSIS — R918 Other nonspecific abnormal finding of lung field: Secondary | ICD-10-CM | POA: Insufficient documentation

## 2017-10-06 DIAGNOSIS — M19041 Primary osteoarthritis, right hand: Secondary | ICD-10-CM

## 2017-10-06 DIAGNOSIS — K219 Gastro-esophageal reflux disease without esophagitis: Secondary | ICD-10-CM

## 2017-10-06 DIAGNOSIS — M19042 Primary osteoarthritis, left hand: Secondary | ICD-10-CM

## 2017-10-06 NOTE — Patient Instructions (Signed)
Methotrexate tablets What is this medicine? METHOTREXATE (METH oh TREX ate) is a chemotherapy drug used to treat cancer including breast cancer, leukemia, and lymphoma. This medicine can also be used to treat psoriasis and certain kinds of arthritis. This medicine may be used for other purposes; ask your health care provider or pharmacist if you have questions. COMMON BRAND NAME(S): Rheumatrex, Trexall What should I tell my health care provider before I take this medicine? They need to know if you have any of these conditions: -fluid in the stomach area or lungs -if you often drink alcohol -infection or immune system problems -kidney disease or on hemodialysis -liver disease -low blood counts, like low white cell, platelet, or red cell counts -lung disease -radiation therapy -stomach ulcers -ulcerative colitis -an unusual or allergic reaction to methotrexate, other medicines, foods, dyes, or preservatives -pregnant or trying to get pregnant -breast-feeding How should I use this medicine? Take this medicine by mouth with a glass of water. Follow the directions on the prescription label. Take your medicine at regular intervals. Do not take it more often than directed. Do not stop taking except on your doctor's advice. Make sure you know why you are taking this medicine and how often you should take it. If this medicine is used for a condition that is not cancer, like arthritis or psoriasis, it should be taken weekly, NOT daily. Taking this medicine more often than directed can cause serious side effects, even death. Talk to your healthcare provider about safe handling and disposal of this medicine. You may need to take special precautions. Talk to your pediatrician regarding the use of this medicine in children. While this drug may be prescribed for selected conditions, precautions do apply. Overdosage: If you think you have taken too much of this medicine contact a poison control center or  emergency room at once. NOTE: This medicine is only for you. Do not share this medicine with others. What if I miss a dose? If you miss a dose, talk with your doctor or health care professional. Do not take double or extra doses. What may interact with this medicine? This medicine may interact with the following medication: -acitretin -aspirin and aspirin-like medicines including salicylates -azathioprine -certain antibiotics like penicillins, tetracycline, and chloramphenicol -cyclosporine -gold -hydroxychloroquine -live virus vaccines -NSAIDs, medicines for pain and inflammation, like ibuprofen or naproxen -other cytotoxic agents -penicillamine -phenylbutazone -phenytoin -probenecid -retinoids such as isotretinoin and tretinoin -steroid medicines like prednisone or cortisone -sulfonamides like sulfasalazine and trimethoprim/sulfamethoxazole -theophylline This list may not describe all possible interactions. Give your health care provider a list of all the medicines, herbs, non-prescription drugs, or dietary supplements you use. Also tell them if you smoke, drink alcohol, or use illegal drugs. Some items may interact with your medicine. What should I watch for while using this medicine? Avoid alcoholic drinks. This medicine can make you more sensitive to the sun. Keep out of the sun. If you cannot avoid being in the sun, wear protective clothing and use sunscreen. Do not use sun lamps or tanning beds/booths. You may need blood work done while you are taking this medicine. Call your doctor or health care professional for advice if you get a fever, chills or sore throat, or other symptoms of a cold or flu. Do not treat yourself. This drug decreases your body's ability to fight infections. Try to avoid being around people who are sick. This medicine may increase your risk to bruise or bleed. Call your doctor or health care professional   if you notice any unusual bleeding. Check with your  doctor or health care professional if you get an attack of severe diarrhea, nausea and vomiting, or if you sweat a lot. The loss of too much body fluid can make it dangerous for you to take this medicine. Talk to your doctor about your risk of cancer. You may be more at risk for certain types of cancers if you take this medicine. Both men and women must use effective birth control with this medicine. Do not become pregnant while taking this medicine or until at least 1 normal menstrual cycle has occurred after stopping it. Women should inform their doctor if they wish to become pregnant or think they might be pregnant. Men should not father a child while taking this medicine and for 3 months after stopping it. There is a potential for serious side effects to an unborn child. Talk to your health care professional or pharmacist for more information. Do not breast-feed an infant while taking this medicine. What side effects may I notice from receiving this medicine? Side effects that you should report to your doctor or health care professional as soon as possible: -allergic reactions like skin rash, itching or hives, swelling of the face, lips, or tongue -breathing problems or shortness of breath -diarrhea -dry, nonproductive cough -low blood counts - this medicine may decrease the number of white blood cells, red blood cells and platelets. You may be at increased risk for infections and bleeding. -mouth sores -redness, blistering, peeling or loosening of the skin, including inside the mouth -signs of infection - fever or chills, cough, sore throat, pain or trouble passing urine -signs and symptoms of bleeding such as bloody or black, tarry stools; red or dark-brown urine; spitting up blood or brown material that looks like coffee grounds; red spots on the skin; unusual bruising or bleeding from the eye, gums, or nose -signs and symptoms of kidney injury like trouble passing urine or change in the amount  of urine -signs and symptoms of liver injury like dark yellow or brown urine; general ill feeling or flu-like symptoms; light-colored stools; loss of appetite; nausea; right upper belly pain; unusually weak or tired; yellowing of the eyes or skin Side effects that usually do not require medical attention (report to your doctor or health care professional if they continue or are bothersome): -dizziness -hair loss -tiredness -upset stomach -vomiting This list may not describe all possible side effects. Call your doctor for medical advice about side effects. You may report side effects to FDA at 1-800-FDA-1088. Where should I keep my medicine? Keep out of the reach of children. Store at room temperature between 20 and 25 degrees C (68 and 77 degrees F). Protect from light. Throw away any unused medicine after the expiration date. NOTE: This sheet is a summary. It may not cover all possible information. If you have questions about this medicine, talk to your doctor, pharmacist, or health care provider.  2018 Elsevier/Gold Standard (2014-10-02 05:39:22)  

## 2017-10-07 ENCOUNTER — Telehealth: Payer: Self-pay | Admitting: Pharmacist

## 2017-10-07 ENCOUNTER — Telehealth: Payer: Self-pay | Admitting: *Deleted

## 2017-10-07 DIAGNOSIS — R9389 Abnormal findings on diagnostic imaging of other specified body structures: Secondary | ICD-10-CM

## 2017-10-07 NOTE — Progress Notes (Signed)
Please notify patient of the chest x-ray results.  Schedule high-resolution CT to evaluate further.

## 2017-10-07 NOTE — Telephone Encounter (Signed)
Patient called to ask more questions about starting methotrexate.  She wanted to know if she would be able to start methotrexate prior to receiving a flu shot or the Shingrix vaccine.  Informed patient that she did not have to get the vaccines prior to starting methotrexate.  Patient is interested in starting medication.  All baseline labs are normal.  Chest x-ray showed some abnormalities.  Dr. Estanislado Pandy recommended chest CT.  Informed patient that she would need to get the CT prior to starting methotrexate.  Patient verbalized understanding and is to call with any further questions.  We will follow-up with patient pending results.

## 2017-10-07 NOTE — Telephone Encounter (Signed)
-----   Message from Bo Merino, MD sent at 10/07/2017  1:58 PM EDT ----- Please notify patient of the chest x-ray results.  Schedule high-resolution CT to evaluate further.

## 2017-10-08 NOTE — Progress Notes (Signed)
Add UPEP

## 2017-10-09 ENCOUNTER — Telehealth: Payer: Self-pay | Admitting: *Deleted

## 2017-10-09 ENCOUNTER — Other Ambulatory Visit: Payer: Self-pay

## 2017-10-09 ENCOUNTER — Encounter: Payer: Self-pay | Admitting: Rheumatology

## 2017-10-09 DIAGNOSIS — M349 Systemic sclerosis, unspecified: Secondary | ICD-10-CM

## 2017-10-09 NOTE — Telephone Encounter (Signed)
-----   Message from Bo Merino, MD sent at 10/08/2017  4:53 PM EDT ----- Add UPEP

## 2017-10-10 LAB — PROTEIN ELECTROPHORESIS, SERUM, WITH REFLEX
ALPHA 2: 0.8 g/dL (ref 0.5–0.9)
Albumin ELP: 3.7 g/dL — ABNORMAL LOW (ref 3.8–4.8)
Alpha 1: 0.3 g/dL (ref 0.2–0.3)
Beta 2: 0.4 g/dL (ref 0.2–0.5)
Beta Globulin: 0.5 g/dL (ref 0.4–0.6)
GAMMA GLOBULIN: 0.6 g/dL — AB (ref 0.8–1.7)
Total Protein: 6.3 g/dL (ref 6.1–8.1)

## 2017-10-10 LAB — QUANTIFERON-TB GOLD PLUS
NIL: 0.02 [IU]/mL
QUANTIFERON-TB GOLD PLUS: NEGATIVE
TB1-NIL: 0 [IU]/mL
TB2-NIL: 0 [IU]/mL

## 2017-10-10 LAB — HIV ANTIBODY (ROUTINE TESTING W REFLEX): HIV: NONREACTIVE

## 2017-10-10 LAB — IGG, IGA, IGM
IGG (IMMUNOGLOBIN G), SERUM: 727 mg/dL (ref 600–1540)
IgM, Serum: 36 mg/dL — ABNORMAL LOW (ref 50–300)
Immunoglobulin A: 172 mg/dL (ref 20–320)

## 2017-10-10 LAB — HEPATITIS B CORE ANTIBODY, IGM: Hep B C IgM: NONREACTIVE

## 2017-10-10 LAB — RHEUMATOID FACTOR: Rhuematoid fact SerPl-aCnc: <14 IU/mL (ref <14)

## 2017-10-10 LAB — HEPATITIS C ANTIBODY
HEP C AB: NONREACTIVE
SIGNAL TO CUT-OFF: 0.01 (ref <1.00)

## 2017-10-10 LAB — HEPATITIS B SURFACE ANTIGEN: Hepatitis B Surface Ag: NONREACTIVE

## 2017-10-10 LAB — 14-3-3 ETA PROTEIN: 14-3-3 eta Protein: <0.2 ng/mL (ref <0.2)

## 2017-10-10 LAB — SEDIMENTATION RATE: SED RATE: 29 mm/h (ref 0–30)

## 2017-10-10 LAB — CYCLIC CITRUL PEPTIDE ANTIBODY, IGG: Cyclic Citrullin Peptide Ab: <16 UNITS

## 2017-10-13 LAB — PROTEIN ELECTROPHORESIS,RANDOM URN
Albumin: 33 %
Alpha-1-Globulin, U: 8 %
Alpha-2-Globulin, U: 22 %
BETA GLOBULIN, U: 23 %
CREATININE, URINE: 128 mg/dL (ref 20–275)
Gamma Globulin, U: 15 %
Protein/Creat Ratio: 195 mg/g creat — ABNORMAL HIGH (ref 21–161)
TOTAL PROTEIN, URINE: 25 mg/dL — AB (ref 5–24)

## 2017-10-13 NOTE — Progress Notes (Signed)
UPEP negative

## 2017-10-19 ENCOUNTER — Ambulatory Visit (HOSPITAL_COMMUNITY)
Admission: RE | Admit: 2017-10-19 | Discharge: 2017-10-19 | Disposition: A | Discharge status: Home / Self Care | Payer: Medicare Other | Source: Ambulatory Visit | Attending: Rheumatology | Admitting: Rheumatology

## 2017-10-19 DIAGNOSIS — I358 Other nonrheumatic aortic valve disorders: Secondary | ICD-10-CM | POA: Insufficient documentation

## 2017-10-19 DIAGNOSIS — R9389 Abnormal findings on diagnostic imaging of other specified body structures: Secondary | ICD-10-CM | POA: Diagnosis not present

## 2017-10-19 DIAGNOSIS — R918 Other nonspecific abnormal finding of lung field: Secondary | ICD-10-CM | POA: Insufficient documentation

## 2017-10-19 DIAGNOSIS — I700 Atherosclerosis of aorta: Secondary | ICD-10-CM | POA: Diagnosis not present

## 2017-10-19 DIAGNOSIS — I2510 Atherosclerotic heart disease of native coronary artery without angina pectoris: Secondary | ICD-10-CM | POA: Diagnosis not present

## 2017-10-20 ENCOUNTER — Telehealth: Payer: Self-pay | Admitting: Internal Medicine

## 2017-10-20 ENCOUNTER — Telehealth: Payer: Self-pay | Admitting: Rheumatology

## 2017-10-20 DIAGNOSIS — J849 Interstitial pulmonary disease, unspecified: Secondary | ICD-10-CM

## 2017-10-20 NOTE — Telephone Encounter (Signed)
error 

## 2017-10-20 NOTE — Telephone Encounter (Signed)
-----   Message from Bo Merino, MD sent at 10/20/2017 12:39 PM EDT ----- I left a message on answering machine for patient to call back.  Her CT scan of the chest is consistent with interstitial lung disease.  I discussed the situation with Dr. Chase Caller.  Please refer send a referral to him.  He recommended using Imuran  for interstitial lung disease and inflammatory arthritis.  He may consider a study drug which is an anti-fibrotic.  Please a schedule patient for an appointment.  She will also need T PMT if she considers going on Imuran.

## 2017-10-20 NOTE — Progress Notes (Signed)
I left a message on answering machine for patient to call back.  Her CT scan of the chest is consistent with interstitial lung disease.  I discussed the situation with Dr. Chase Caller.  Please refer send a referral to him.  He recommended using Imuran for interstitial lung disease and inflammatory arthritis.  He may consider a study drug which is an anti-fibrotic.  Please a schedule patient for an appointment.  She will also need T PMT if she considers going on Imuran.

## 2017-10-20 NOTE — Telephone Encounter (Signed)
DrDeveshwar called - wants me to see Lindsay Herman in ILD clinic for scleroderma + UIP on CT  Plan  - please get full PFt  - please have her do [facility] ILD questionnaire - ILD clinic first available - have to consier ofev (Dr D Will consider immuran for arthritis) - will do simple walk test in office   Thanks    SIGNATURE    Dr. Brand Males, M.D., F.C.C.P,  Pulmonary and Critical Care Medicine Staff Physician, [place] Director - Interstitial Lung Disease  Program  Pulmonary [place] [facility] [place], [place], [address]  Pager: [phone], If no answer or between  15:00h - 7:00h: call [phone] Telephone: [phone]  12:39 PM 10/20/2017

## 2017-10-21 ENCOUNTER — Other Ambulatory Visit: Payer: Self-pay

## 2017-10-21 DIAGNOSIS — J849 Interstitial pulmonary disease, unspecified: Secondary | ICD-10-CM

## 2017-10-21 NOTE — Telephone Encounter (Signed)
Attempted to call pt to see if I could get her scheduled for a consult appt with Dr. Chase Caller with a full PFT before but unable to reach pt.  Left message for pt to return call x1 so that way we can get her added on the schedule.

## 2017-10-21 NOTE — Telephone Encounter (Signed)
Patient returned call. I have scheduled pt to see MR for consult on 11/10/17 with full PFT at 3pm followed by OV at 4pm in ILD clinic with Dr. Chase Caller.  I have also placed the ILD questionnaire packet in mail for pt to fill out.  Nothing further needed.

## 2017-10-25 LAB — THIOPURINE METHYLTRANSFERASE (TPMT), RBC: THIOPURINE METHYLTRANSFERASE, RBC: 9 nmol/h/mL — AB

## 2017-10-26 NOTE — Progress Notes (Signed)
Needs appt to discuss meds.

## 2017-10-27 NOTE — Progress Notes (Signed)
Office Visit Note  Patient: Lindsay Herman             Date of Birth: 1945-11-26           MRN: [id number]             PCP: Burnard Bunting, MD Referring: Burnard Bunting, MD Visit Date: 10/29/2017 Occupation: @GUAROCC @  Subjective:  Pain in hands  History of Present Illness: Lindsay Herman is a 72 y.o. female with history of seropositive rheumatoid arthritis, interstitial lung disease, scleroderma, and osteoarthritis.  Patient reports that she continues to have fatigue.  She had recent chest x-ray and then CT scan of her chest which was consistent with interstitial lung disease.  She has appointment coming up with Dr. Chase Caller.  She has been having pain and swelling in her wrist joints.  She states the pain gets worse at night.  She also has noticed decreased grip strength.  None of the other joints are swollen.  She denies any shortness of breath.  She has had evaluation by Dr. Haroldine Laws in the past for scleroderma.  She recalls having PFTs last year.  Activities of Daily Living:  Patient reports morning stiffness for 5 minutes.   Patient Reports nocturnal pain.  Difficulty dressing/grooming: Denies Difficulty climbing stairs: Denies Difficulty getting out of chair: Denies Difficulty using hands for taps, buttons, cutlery, and/or writing: Reports  Review of Systems  Constitutional: Positive for fatigue.  HENT: Positive for mouth dryness. Negative for mouth sores, trouble swallowing, trouble swallowing and nose dryness.   Eyes: Negative for pain, redness, visual disturbance and dryness.  Respiratory: Negative for cough, hemoptysis, shortness of breath and difficulty breathing.   Cardiovascular: Negative for chest pain, palpitations, hypertension and swelling in legs/feet.  Gastrointestinal: Negative for abdominal pain, blood in stool, constipation, diarrhea, nausea and vomiting.  Endocrine: Negative for increased urination.  Genitourinary: Negative for painful urination,  nocturia and pelvic pain.  Musculoskeletal: Positive for arthralgias, joint pain, joint swelling and morning stiffness. Negative for myalgias, muscle weakness, muscle tenderness and myalgias.  Skin: Negative for color change, pallor, rash, hair loss, nodules/bumps, skin tightness, ulcers and sensitivity to sunlight.  Allergic/Immunologic: Negative for susceptible to infections.  Neurological: Negative for dizziness, light-headedness, numbness, headaches, memory loss and weakness.  Hematological: Negative for swollen glands.  Psychiatric/Behavioral: Negative for depressed mood, confusion and sleep disturbance. The patient is not nervous/anxious.     PMFS History:  Patient Active Problem List   Diagnosis Date Noted  . Scleroderma, limited ([place]) 02/10/2016  . History of environmental allergies 02/10/2016  . Gastroesophageal reflux disease 02/10/2016  . History of colon cancer 02/10/2016  . Sicca syndrome 02/01/2016  . History of repair of right rotator cuff 02/01/2016  . Primary osteoarthritis of both knees 02/01/2016  . Primary osteoarthritis of both hands 02/01/2016  . Raynaud's disease without gangrene 02/01/2016  . Cystocele   . Atrophic vaginitis   . Osteopenia   . Menopausal symptoms     Past Medical History:  Diagnosis Date  . Atrophic vaginitis   . Cancer [facility])    Colon cancer  . Contact lens/glasses fitting    wears contacts or glasses  . Cystocele   . Osteopenia 06/2017   T score -2.0 FRAX 13% / 2.8%  . PONV (postoperative nausea and vomiting)   . Raynaud's disease   . Rectocele   . Rosacea   . Scleroderma ([place])   . Urinary frequency     Family History  Problem Relation Age  of Onset  . Diabetes Mother   . Heart disease Mother   . Stroke Mother   . Diabetes Brother   . Hypertension Brother   . Lupus Father    Past Surgical History:  Procedure Laterality Date  . APPENDECTOMY    . COLON RESECTION  1995  . MASS EXCISION Left 08/31/2012   Procedure: EXCISION  MUCOID CYST DEBRIDEMENT DISTAL INTERPHALANGEAL LEFT MIDDLE FINGER;  Surgeon: Wynonia Sours, MD;  Location: [place];  Service: Orthopedics;  Laterality: Left;  . NOSE SURGERY    . OOPHORECTOMY     LSO  . ROTATOR CUFF REPAIR    . TONSILLECTOMY    . VAGINAL HYSTERECTOMY  1994   Social History   Social History Narrative  . Not on file    Objective: Vital Signs: BP 118/74 (BP Location: Right Arm, Patient Position: Sitting, Cuff Size: Normal)   Pulse 87   Resp 13   Ht 5\' 7"  (1.702 m)   Wt 166 lb (75.3 kg)   BMI 26.00 kg/m    Physical Exam  Constitutional: She is oriented to person, place, and time. She appears well-developed and well-nourished.  HENT:  Head: Normocephalic and atraumatic.  Eyes: Conjunctivae and EOM are normal.  Neck: Normal range of motion.  Cardiovascular: Normal rate, regular rhythm, normal heart sounds and intact distal pulses.  Pulmonary/Chest: Effort normal and breath sounds normal.  Abdominal: Soft. Bowel sounds are normal.  Lymphadenopathy:    She has no cervical adenopathy.  Neurological: She is alert and oriented to person, place, and time.  Skin: Skin is warm and dry. Capillary refill takes less than 2 seconds.  Sclerodactyly present.  Psychiatric: She has a normal mood and affect. Her behavior is normal.  Nursing note and vitals reviewed.    Musculoskeletal Exam: C-spine thoracic lumbar spine good range of motion.  Shoulder joints elbow joints in good range of motion.  She has some synovitis of her wrist joint MCPs as described below.  Hip joints knee joints ankles MTPs PIPs were in good range of motion. CDAI Exam: CDAI Score: 2.2  Patient Global Assessment: 1 (mm); Provider Global Assessment: 1 (mm) Swollen: 1 ; Tender: 1  Joint Exam      Right  Left  Wrist  Swollen Tender        Investigation: No additional findings.  Imaging: Dg Chest 2 View  Result Date: 10/07/2017 CLINICAL DATA:  High risk medication use,  immunosuppressive therapy. Pt stated that her doctor wants to put her on a new medication. Hx; former smoker of 3 years quit in 1974. EXAM: CHEST - 2 VIEW COMPARISON:  None. FINDINGS: Symmetric biapical pleuroparenchymal scarring. Faint reticular accentuation at the left lung base. Cardiac and mediastinal margins appear normal. Mild thoracic spondylosis and mild S-shaped thoracolumbar scoliosis. IMPRESSION: 1. Faint linear reticular accentuation at the left lung base could be from subsegmental atelectasis, chronic interstitial accentuation, or some localized atypical infectious bronchiolitis. If clinically warranted, high-resolution chest CT could be utilized for further characterization. 2. Mild biapical pleuroparenchymal scarring. 3. No acute findings. Electronically Signed   By: Van Clines M.D.   On: 10/07/2017 12:33   Ct Chest High Resolution  Result Date: 10/19/2017 CLINICAL DATA:  72 year old female with abnormal chest x-ray. Followup study. EXAM: CT CHEST WITHOUT CONTRAST TECHNIQUE: Multidetector CT imaging of the chest was performed following the standard protocol without intravenous contrast. High resolution imaging of the lungs, as well as inspiratory and expiratory imaging, was performed. COMPARISON:  No prior chest CT. FINDINGS: Cardiovascular: Heart size is normal. There is no significant pericardial fluid, thickening or pericardial calcification. There is aortic atherosclerosis, as well as atherosclerosis of the great vessels of the mediastinum and the coronary arteries, including calcified atherosclerotic plaque in the left main and left anterior descending coronary arteries. Calcifications of the aortic valve (mild). Mediastinum/Nodes: No pathologically enlarged mediastinal or hilar lymph nodes. Please note that accurate exclusion of hilar adenopathy is limited on noncontrast CT scans. Esophagus is unremarkable in appearance. No axillary lymphadenopathy. Lungs/Pleura: High-resolution  images demonstrates some patchy peripheral predominant areas of septal thickening and mild subpleural reticulation, as well as peripheral predominant ground-glass attenuation which is most evident in the lung bases bilaterally (left greater than right). There is also some mild cylindrical traction bronchiectasis and peripheral bronchiolectasis. No frank honeycombing. Inspiratory and expiratory imaging is unremarkable. No acute consolidative airspace disease. No pleural effusions. A few scattered 2-3 mm pulmonary nodules are noted throughout the periphery of the lungs bilaterally, nonspecific, but favored to reflect areas of mucoid impaction within terminal bronchioles. No other larger more suspicious appearing pulmonary nodules or masses are noted. Bilateral apical nodular pleuroparenchymal thickening and architectural distortion, most compatible with areas of chronic post infectious or inflammatory scarring. Upper Abdomen: Subcentimeter low-attenuation lesion in the periphery of segment 8 of the liver, incompletely characterized on today's noncontrast CT examination, but statistically likely a cyst. Musculoskeletal: There are no aggressive appearing lytic or blastic lesions noted in the visualized portions of the skeleton. IMPRESSION: 1. The appearance of the lungs is compatible with interstitial lung disease, with a pattern considered compatible with ATS criteria probable usual interstitial pneumonia (UIP). Repeat high-resolution chest CT is recommended in 12 months to assess for temporal changes in the appearance of the lung parenchyma. 2. Multiple tiny 2-3 mm pulmonary nodules scattered throughout the lungs bilaterally, highly nonspecific but statistically likely benign. Attention at time of repeat high-resolution chest CT is recommended to ensure the stability of these findings. This recommendation follows the consensus statement: Guidelines for Management of Incidental Pulmonary Nodules Detected on CT Images:  From the Fleischner Society 2017; Radiology 2017; 284:228-243. 3. Aortic atherosclerosis, in addition to left main and left anterior descending coronary artery disease. Assessment for potential risk factor modification, dietary therapy or pharmacologic therapy may be warranted, if clinically indicated. 4. There are calcifications of the aortic valve. Echocardiographic correlation for evaluation of potential valvular dysfunction may be warranted if clinically indicated. Aortic Atherosclerosis (ICD10-I70.0). Electronically Signed   By: Vinnie Langton M.D.   On: 10/19/2017 19:05   Xr Hand 2 View Left  Result Date: 10/06/2017 PIP/DIP and CMC narrowing was noted.  No MCP joint narrowing was noted.  No intercarpal radiocarpal joint space narrowing was noted. Impression: These findings are consistent with osteoarthritis of the hand.  Xr Hand 2 View Right  Result Date: 10/06/2017 CMC, PIP and DIP narrowing was noted.  No MCP joint narrowing, mild intercarpal joint space narrowing was noted.  Some cystic versus erosive changes were noted in the right wrist carpal bones and possible in the orbital styloid. Impression: These findings are consistent with osteoarthritis and inflammatory arthritis.   Recent Labs: Lab Results  Component Value Date   WBC 7.0 02/12/2016   HGB 13.9 02/12/2016   PLT 331 02/12/2016   NA 139 02/12/2016   K 4.8 02/12/2016   CL 104 02/12/2016   CO2 25 02/12/2016   GLUCOSE 98 02/12/2016   BUN 11 02/12/2016   CREATININE 0.72 02/12/2016  BILITOT 0.5 02/12/2016   ALKPHOS 61 02/12/2016   AST 24 02/12/2016   ALT 24 02/12/2016   PROT 6.3 10/06/2017   ALBUMIN 4.1 02/12/2016   CALCIUM 9.2 02/12/2016   GFRAA >89 02/12/2016   QFTBGOLDPLUS NEGATIVE 10/06/2017    Speciality Comments: No specialty comments available.  Procedures:  No procedures performed Allergies: Codeine and Morphine and related   Assessment / Plan:     Visit Diagnoses: Seronegative rheumatoid arthritis  ([place]) - RF-, CCP-, 14-3-3 eta negative, R wrist synovitis, erosive changes, intercarpal and radiocarpal joint space narrowing.  She continues to have some synovitis.  We had detailed discussion regarding methotrexate during the last visit but it was not initiated as she had abnormal chest x-ray and CT scan.  CT scan revealed interstitial lung disease.  I had discussion with Dr. Chase Caller who recommended Imuran.  Indications side effects contraindications of Imuran were discussed at length.  She has T PMT deficiency.  We will monitor her labs closely and start her on low-dose Imuran.  The plan is to start her on Imuran 50 mg p.o. daily and will recheck labs in 2 weeks.  If her labs are stable I may consider increasing her Imuran to 75 mg p.o. daily we will check labs again in 2 weeks and then every 2 months to monitor for drug toxicity.  Indications side effects contraindications were discussed at length.  Medication counseling:  TPMT: 9  Patient was counseled on the purpose, proper use, and adverse effects of azathioprine including risk of infection, nausea, rash, and hair loss.  Reviewed risk of cancer after long term use.  Discussed risk of myelosupression and reviewed importance of frequent lab work to monitor blood counts.  Reviewed drug-drug interactions including contraindication with allopurinol.  Provided patient with educational materials on azathioprine and answered all questions.  Patient consented to azathioprine.  Will upload consent into the media tab.   T PMT intermediate metabolizer-we will have to monitor her labs closely.  High risk medication use -  - Plan: CBC with Differential/Platelet, COMPLETE METABOLIC PANEL WITH GFR in 2 weeks x 2 and then every 2 months  Interstitial lung disease (HCC)-she is appointment coming up with pulmonary.  Scleroderma, limited (HCC)-she has a sclerodactyly which is unchanged.  Sicca syndrome-tolerable with over-the-counter medications.  Raynaud's  disease without gangrene-Raynauds is not currently active.  Primary osteoarthritis of both hands-joint protection muscle strengthening was discussed.  Primary osteoarthritis of both knees-she has some discomfort in her knee joints.  History of repair of right rotator cuff  History of colon cancer   Orders: Orders Placed This Encounter  Procedures  . CBC with Differential/Platelet  . COMPLETE METABOLIC PANEL WITH GFR   Meds ordered this encounter  Medications  . azaTHIOprine (IMURAN) 50 MG tablet    Sig: Take 1 tablet (50 mg total) by mouth daily.    Dispense:  30 tablet    Refill:  1    Face-to-face time spent with patient was 30 minutes. Greater than 50% of time was spent in counseling and coordination of care.  Follow-Up Instructions: Return in about 3 months (around 01/28/2018) for Rheumatoid arthritis, ILD, Scleroderma , Osteoarthritis.   Bo Merino, MD  Note - This record has been created using Editor, commissioning.  Chart creation errors have been sought, but may not always  have been located. Such creation errors do not reflect on  the standard of medical care.

## 2017-10-29 ENCOUNTER — Encounter: Payer: Self-pay | Admitting: Physician Assistant

## 2017-10-29 ENCOUNTER — Ambulatory Visit: Payer: Medicare Other | Admitting: Physician Assistant

## 2017-10-29 VITALS — BP 118/74 | HR 87 | Resp 13 | Ht 67.0 in | Wt 166.0 lb

## 2017-10-29 DIAGNOSIS — J849 Interstitial pulmonary disease, unspecified: Secondary | ICD-10-CM | POA: Diagnosis not present

## 2017-10-29 DIAGNOSIS — Z9889 Other specified postprocedural states: Secondary | ICD-10-CM

## 2017-10-29 DIAGNOSIS — M0600 Rheumatoid arthritis without rheumatoid factor, unspecified site: Secondary | ICD-10-CM | POA: Diagnosis not present

## 2017-10-29 DIAGNOSIS — I7300 Raynaud's syndrome without gangrene: Secondary | ICD-10-CM

## 2017-10-29 DIAGNOSIS — M19042 Primary osteoarthritis, left hand: Secondary | ICD-10-CM

## 2017-10-29 DIAGNOSIS — M170 Bilateral primary osteoarthritis of knee: Secondary | ICD-10-CM

## 2017-10-29 DIAGNOSIS — Z85038 Personal history of other malignant neoplasm of large intestine: Secondary | ICD-10-CM

## 2017-10-29 DIAGNOSIS — M349 Systemic sclerosis, unspecified: Secondary | ICD-10-CM

## 2017-10-29 DIAGNOSIS — E8889 Other specified metabolic disorders: Secondary | ICD-10-CM | POA: Diagnosis not present

## 2017-10-29 DIAGNOSIS — Z79899 Other long term (current) drug therapy: Secondary | ICD-10-CM

## 2017-10-29 DIAGNOSIS — M19041 Primary osteoarthritis, right hand: Secondary | ICD-10-CM

## 2017-10-29 DIAGNOSIS — R682 Dry mouth, unspecified: Secondary | ICD-10-CM

## 2017-10-29 MED ORDER — AZATHIOPRINE 50 MG PO TABS: 50.0000 mg | ORAL_TABLET | Freq: Every day | ORAL | 1 refills | Status: DC

## 2017-10-29 NOTE — Progress Notes (Signed)
Patient Counseling Note  Patient was counseled on the purpose, proper use, and adverse effects of azathioprine including risk of infection, nausea, rash, and hair loss.  Reviewed risk of cancer after long term use.  Discussed risk of myelosupression and reviewed importance of frequent lab work to monitor blood counts.  Instructed patient to come for labs in 2,4,8 weeks and then every 3 months.  Reviewed recommended vaccines and are listed in AVS to check with PCP.  Patient has received flu shot this year already but has not had Shingrix.   Reviewed drug-drug interactions including contraindication with allopurinol.  Provided patient with educational materials on azathioprine and answered all questions.    Starting dose will be 50 mg daily due to low TPMT. Recommended patient to take medication at dinner to help minimize GI upset. Patient consented to azathioprine.  Will upload consent into the media tab.   Mariella Saa, PharmD, [facility] Rheumatology Clinical Pharmacist  10/29/2017 4:40 PM

## 2017-10-29 NOTE — Patient Instructions (Addendum)
Standing Labs We placed an order today for your standing lab work.    Please come back and get your standing labs in 2 weeks, then 4 weeks, then 8 weeks, then every 3 months.  We have open lab Monday through Friday from 8:30-11:30 AM and 1:30-4:00 PM  at the office of Dr. Bo Merino.   You may experience shorter wait times on Monday and Friday afternoons. The office is located at [address], [place], [place], [place] [address] No appointment is necessary.   Labs are drawn by [facility].  You may receive a bill from [place] for your lab work. If you have any questions regarding directions or hours of operation,  please call [phone].    Vaccines You are taking a medication(s) that can suppress your immune system.  The following immunizations are recommended: . Flu annually . Pneumonia . Shingles . Hepatitis B  Please check with your PCP to make sure you are up to date.  Azathioprine tablets What is this medicine? AZATHIOPRINE (ay za THYE oh preen) suppresses the immune system. It is used to prevent organ rejection after a transplant. It is also used to treat rheumatoid arthritis. This medicine may be used for other purposes; ask your health care provider or pharmacist if you have questions. COMMON BRAND NAME(S): Azasan, Imuran What should I tell my health care provider before I take this medicine? They need to know if you have any of these conditions: -infection -kidney disease -liver disease -an unusual or allergic reaction to azathioprine, other medicines, lactose, foods, dyes, or preservatives -pregnant or trying to get pregnant -breast feeding How should I use this medicine? Take this medicine by mouth with a full glass of water. Follow the directions on the prescription label. Take your medicine at regular intervals. Do not take your medicine more often than directed. Continue to take your medicine even if you feel better. Do not stop taking except on your  doctor's advice. Talk to your pediatrician regarding the use of this medicine in children. Special care may be needed. Overdosage: If you think you have taken too much of this medicine contact a poison control center or emergency room at once. NOTE: This medicine is only for you. Do not share this medicine with others. What if I miss a dose? If you miss a dose, take it as soon as you can. If it is almost time for your next dose, take only that dose. Do not take double or extra doses. What may interact with this medicine? Do not take this medicine with any of the following medications: -febuxostat -mercaptopurine This medicine may also interact with the following medications: -allopurinol -aminosalicylates like sulfasalazine, mesalamine, balsalazide, and olsalazine -leflunomide -medicines called ACE inhibitors like benazepril, captopril, enalapril, fosinopril, quinapril, lisinopril, ramipril, and trandolapril -mycophenolate -sulfamethoxazole; trimethoprim -vaccines -warfarin This list may not describe all possible interactions. Give your health care provider a list of all the medicines, herbs, non-prescription drugs, or dietary supplements you use. Also tell them if you smoke, drink alcohol, or use illegal drugs. Some items may interact with your medicine. What should I watch for while using this medicine? Visit your doctor or health care professional for regular checks on your progress. You will need frequent blood checks during the first few months you are receiving the medicine. If you get a cold or other infection while receiving this medicine, call your doctor or health care professional. Do not treat yourself. The medicine may increase your risk of getting an infection.  Women should inform their doctor if they wish to become pregnant or think they might be pregnant. There is a potential for serious side effects to an unborn child. Talk to your health care professional or pharmacist for  more information. Men may have a reduced sperm count while they are taking this medicine. Talk to your health care professional for more information. This medicine may increase your risk of getting certain kinds of cancer. Talk to your doctor about healthy lifestyle choices, important screenings, and your risk. What side effects may I notice from receiving this medicine? Side effects that you should report to your doctor or health care professional as soon as possible: -allergic reactions like skin rash, itching or hives, swelling of the face, lips, or tongue -changes in vision -confusion -fever, chills, or any other sign of infection -loss of balance or coordination -severe stomach pain -unusual bleeding, bruising -unusually weak or tired -vomiting -yellowing of the eyes or skin Side effects that usually do not require medical attention (report to your doctor or health care professional if they continue or are bothersome): -hair loss -nausea This list may not describe all possible side effects. Call your doctor for medical advice about side effects. You may report side effects to FDA at 1-800-FDA-1088. Where should I keep my medicine? Keep out of the reach of children. Store at room temperature between 15 and 25 degrees C (59 and 77 degrees F). Protect from light. Throw away any unused medicine after the expiration date. NOTE: This sheet is a summary. It may not cover all possible information. If you have questions about this medicine, talk to your doctor, pharmacist, or health care provider.  2018 Elsevier/Gold Standard (2013-05-24 12:00:31)

## 2017-11-05 ENCOUNTER — Encounter: Payer: Self-pay | Admitting: Rheumatology

## 2017-11-06 NOTE — Telephone Encounter (Signed)
Called patient to discuss drug interaction between Cymbalta and ibuprofen.  Informed patient that the combination of Cymbalta and ibuprofen has been linked to increased risk of bleeding and GI bleeds.  Let the patient know that this is something I have not seen in practice and is associated with chronic high doses of ibuprofen.  She currently takes ibuprofen in the evening to alleviate her wrist pain and help her sleep.  Instructed patient that she can continue to take the ibuprofen in the evening as needed since it is such a low dose.  Patient stated that she will take ibuprofen for severe pain in the evening and Tylenol for moderate pain.  Reviewed proper dose, administration and common side effects for ibuprofen and Tylenol.  Reviewed signs and symptoms of GI bleed such as coughing up coffee grounds and black tarry stools.  Instructed patient to go to the emergency department if those symptoms occur.  All questions encouraged and answered.  Patient appreciated medication information.

## 2017-11-10 ENCOUNTER — Encounter: Payer: Self-pay | Admitting: Internal Medicine

## 2017-11-10 ENCOUNTER — Ambulatory Visit: Payer: Medicare Other | Admitting: Internal Medicine

## 2017-11-10 ENCOUNTER — Ambulatory Visit (INDEPENDENT_AMBULATORY_CARE_PROVIDER_SITE_OTHER): Payer: Medicare Other | Admitting: Internal Medicine

## 2017-11-10 VITALS — BP 128/84 | HR 68 | Ht 67.0 in | Wt 165.0 lb

## 2017-11-10 DIAGNOSIS — J849 Interstitial pulmonary disease, unspecified: Secondary | ICD-10-CM

## 2017-11-10 DIAGNOSIS — M349 Systemic sclerosis, unspecified: Secondary | ICD-10-CM | POA: Diagnosis not present

## 2017-11-10 LAB — PULMONARY FUNCTION TEST
DL/VA % pred: 81 %
DL/VA: 4.18 ml/min/mmHg/L
DLCO unc % pred: 66 %
DLCO unc: 18.84 ml/min/mmHg
FEF 25-75 Post: 4.35 L/sec
FEF 25-75 Pre: 4.12 L/sec
FEF2575-%Change-Post: 5 %
FEF2575-%Pred-Post: 223 %
FEF2575-%Pred-Pre: 212 %
FEV1-%Change-Post: -1 %
FEV1-%PRED-POST: 115 %
FEV1-%PRED-PRE: 117 %
FEV1-POST: 2.85 L
FEV1-PRE: 2.89 L
FEV1FVC-%CHANGE-POST: -1 %
FEV1FVC-%Pred-Pre: 119 %
FEV6-%Change-Post: 0 %
FEV6-%PRED-PRE: 103 %
FEV6-%Pred-Post: 103 %
FEV6-POST: 3.23 L
FEV6-PRE: 3.22 L
FEV6FVC-%Pred-Post: 105 %
FEV6FVC-%Pred-Pre: 105 %
FVC-%Change-Post: 0 %
FVC-%PRED-PRE: 98 %
FVC-%Pred-Post: 98 %
FVC-POST: 3.23 L
FVC-PRE: 3.22 L
POST FEV6/FVC RATIO: 100 %
Post FEV1/FVC ratio: 88 %
Pre FEV1/FVC ratio: 90 %
Pre FEV6/FVC Ratio: 100 %
RV % PRED: 82 %
RV: 1.97 L
TLC % PRED: 91 %
TLC: 5.04 L

## 2017-11-10 NOTE — Progress Notes (Signed)
PFT done today. 

## 2017-11-10 NOTE — Progress Notes (Addendum)
Subjective:    Patient ID: Lindsay Herman, female    DOB: August 27, 1945, 72 y.o.   MRN: [id number]  HPI   Background history followed by Dr. Estanislado Pandy - Scleroderma, limited ([place]) - Hx sclerodactyly, Raynauds, arthralgias, +ANA, +Ro, +La, +RF ; Off PLQ due to Macular Degenration Concern. - Raynaud's disease without gangrene-it is not very symptomatic currently. - Sicca syndrome-she continues to have dry mouth and dry eyes.  The symptoms are tolerable. - Primary osteoarthritis of both hands-she has some changes consistent with osteoarthritis. - Antibiodies - 10/06/17   - neg CCP and RA   Current history -72 year old lady with the above medical problems.  Known to have scleroderma for at least 10 years according to history.  She tells me that she has had insidious onset of shortness of breath for heavy exertion after doing long walks for a few to several years.  It is stable and has not changed.  She thought this was because of aging.  She does not have scleroderma.  She had pulmonary function test in the past but she does not know the result.  Looking at it it shows reduction in diffusion capacity.  Most recently based on the history and review of rheumatology charts it appears that she started having arthritis in her forearm.  This was in August 2019.  Rheumatoid arthritis work-up was negative on serology.  However because the arthritis was felt to be inflammatory methotrexate was considered.  Because of her previous abnormal pulmonary function test high-resolution CT chest was obtained and it showed probable UIP pattern ILD.  I personally visualized the CT chest and agree with those findings.  Repeat pulmonary function test showed stability.  Therefore she has been referred here.  In the phone conversation with Dr. Keturah Barre I was okay with patient starting Imuran against both arthritis and ILD.  At this point that she is feeling stable.  We asked her to do interstitial lung disease questionnaire.  We sent  this to her to her house but she forgot to bring it.  Walking desaturation test was normal without any desaturations.    [name] Integrated Comprehensive ILD Questionnaire  -Symptoms: She has level 3 dyspnea for walking up stairs or walking up a hill but otherwise does not have dyspnea.  She does not cough does not have a  Past medical history: She is occult positive for rheumatoid arthritis for a few weeks.  She she circled positive for scleroderma limited since 2010 but otherwise negative  Review of systems: Positive for fatigue at times for few years.  Arthralgia for several years.  She has had Raynud for several years.  She says after she retired and not having to rush to school and not being exposed to the outdoor air in the winter it is actually much better and not noticeable.  She does have some snoring but it is controlled with Afrin nasal spray.  Personal exposure history: Started smoking 1968 quit in 1974.  Smoked 8 cigarettes a day.  She has lived with a smoker but denies any use of cigars or marijuana cocaine intravenous drug use of vaping products.  Home exposure history: Single-family home suburban 33 years in the same home age of the home of 67 years  Occupational history: 122 questionnaires positive first living in a condition spaces but otherwise negative.  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 Range 04/16/2016 12:44 11/10/2017 14:54  FVC-Pre Latest Units: L 3.14 3.22  FVC-%Pred-Pre Latest Units: % 94 98   Results for Lindsay Herman, Lindsay Herman (MRN [id number]) as of 11/10/2017 16:59  Ref. Range 04/16/2016 12:44 11/10/2017 14:54  DLCO unc Latest Units: ml/min/mmHg 20.08 18.84  DLCO unc % pred Latest Units: % 70 66   Review of Systems  HENT: Positive for postnasal drip and sinus pressure.   Respiratory: Positive for shortness of  breath.   Cardiovascular: Positive for leg swelling.       Family history of heart disease  Musculoskeletal: Positive for joint swelling.  Psychiatric/Behavioral: Positive for dysphoric mood.     has a past medical history of Atrophic vaginitis, Cancer ([place]), Contact lens/glasses fitting, Cystocele, Osteopenia (06/2017), PONV (postoperative nausea and vomiting), Raynaud's disease, Rectocele, Rosacea, Scleroderma ([place]), and Urinary frequency.   reports that she quit smoking about 45 years ago. Her smoking use included cigarettes. She quit after 3.00 years of use. She has never used smokeless tobacco.  Past Surgical History:  Procedure Laterality Date  . APPENDECTOMY    . colon cancer    . COLON RESECTION  1995  . MASS EXCISION Left 08/31/2012   Procedure: EXCISION MUCOID CYST DEBRIDEMENT DISTAL INTERPHALANGEAL LEFT MIDDLE FINGER;  Surgeon: Wynonia Sours, MD;  Location: [facility];  Service: Orthopedics;  Laterality: Left;  . NOSE SURGERY    . OOPHORECTOMY     [facility]  . ROTATOR CUFF REPAIR    . TONSILLECTOMY    . VAGINAL HYSTERECTOMY  1994    Allergies  Allergen Reactions  . Codeine Nausea And Vomiting  . Morphine And Related Nausea And Vomiting    Immunization History  Administered Date(s) Administered  . Influenza Split 12/12/2010    Family History  Problem Relation Age of Onset  . Diabetes Mother   . Heart disease Mother   . Stroke Mother   . Diabetes Brother   . Hypertension Brother   . Lupus Father      Current Outpatient Medications:  .  azaTHIOprine (IMURAN) 50 MG tablet, Take 1 tablet (50 mg total) by mouth daily., Disp: 30 tablet, Rfl: 1 .  COLLAGEN PO, Take by mouth., Disp: , Rfl:  .  DULoxetine (CYMBALTA) 60 MG capsule, TK ONE C PO QD., Disp: , Rfl: 6 .  Multiple Vitamins-Minerals (PRESERVISION AREDS 2) CAPS, Take 1 capsule by mouth 2 (two) times daily., Disp: , Rfl:  .  rosuvastatin (CRESTOR) 20 MG tablet, Take 20 mg by mouth once a week.,  Disp: , Rfl: 0 .  gabapentin (NEURONTIN) 100 MG capsule, Take 100 mg by mouth as needed. , Disp: , Rfl:       Objective:   Physical Exam  Vitals:   11/10/17 1609  BP: 128/84  Pulse: 68  SpO2: 94%  Weight: 165 lb (74.8 kg)  Height: 5\' 7"  (1.702 m)    Estimated body mass index is 25.84 kg/m as calculated from the following:   Height as of this encounter: 5\' 7"  (1.702 m).   Weight as of this encounter: 165 lb (74.8 kg).  General Appearance:    Alert, cooperative, no distress, appears stated age - yes , sitting on - chair  Head:    Normocephalic, without obvious abnormality, atraumatic  Eyes:    PERRL, conjunctiva/corneas clear,  Ears:    Normal TM's and external ear canals, both ears  Nose:   Nares normal,  septum midline, mucosa normal, no drainage    or sinus tenderness. OXYGEN ON no @ ra  Throat:   Lips, mucosa, and tongue normal; teeth and gums normal. Cyanosis on lips - no  Neck:   Supple, symmetrical, trachea midline, no adenopathy;    thyroid:  no enlargement/tenderness/nodules; no carotid   bruit or JVD  Back:     Symmetric, no curvature, ROM normal, no CVA tenderness  Lungs:     Distress - no , Wheeze n, [place]rell Chest - no, Purse lip breathing - no, Crackles - yes mild at bsae   Chest Wall:    No tenderness or deformity. Scars in chest no   Heart:    Regular rate and rhythm, S1 and S2 normal, no murmur, rub   or gallop  Breast Exam:    NOT DONE  Abdomen:     Soft, non-tender, bowel sounds active all four quadrants,    no masses, no organomegaly  Genitalia:   NOT DONE  Rectal:   NOT DONE  Extremities:   Extremities normal, atraumatic, Clubbing - no, Edema - no. SCLERODERMA + . Mild Raynaud +  Pulses:   2+ and symmetric all extremities  Skin:   Stigmata of Connective Tissue Disease - yes , scleroderma  Lymph nodes:   Cervical, supraclavicular, and axillary nodes normal  Psychiatric:  Neurologic:   pleasant CNII-XII intact, normal strength, sensation  throughout             Assessment & Plan:     ICD-10-CM   1. ILD (interstitial lung disease) ([place]) J84.9   2. Scleroderma ([facility]) M34.9     Patient Instructions  ILD (interstitial lung disease) ([place])  Scleroderma ([place])  You have interstitial lung disease secondary to scleroderma. There was evidence of this even in March 2018 pulmonary function testing. The good news is that it is mild and is also stable between March 2018 in October 2019 However as explained this can become unpredictably progressive especially over time Therefore strategies to prevent potential progression is important  Plan -At this time I recommend you continue Imuran that was prescribed by Dr. Keturah Barre for your joint pain -There is evidence of the midline is also lung protective -We will continue to monitor your situation with serial clinical visits lung function and flexible approach to CT scan -If you have intolerance to Imuran or this potential for progression then we can add nintedanib which is been in the market since October 2014 for another type of pulmonary fibrosis call IPF (nintedanib was recently approved for scleroderma interstitial lung disease] -At this point in time he did not need oxygen therapy -Continue to keep yourselff it -High-dose flu shot today -Pneumovax-23 today  Follow-up -In 6 months do spirometry and DLCO test -Return to ILD clinic with Dr Chase Caller on Tuesday afternoon or Thursday Morning in 6 months  - simple walk test (not 6 mwd) to be done at followup - YES         SIGNATURE    Dr. Brand Males, M.D., F.C.C.P,  Pulmonary and Critical Care Medicine Staff Physician, [place] Director - Interstitial Lung Disease  Program  [facility] [place] [facility] [place], [place], [address]  Pager: [phone], If no answer or between  15:00h - 7:00h: call [phone] Telephone: [phone]  5:04 PM 11/10/2017

## 2017-11-10 NOTE — Patient Instructions (Addendum)
ILD (interstitial lung disease) ([place])  Scleroderma ([facility])  You have interstitial lung disease secondary to scleroderma. There was evidence of this even in March 2018 pulmonary function testing. The good news is that it is mild and is also stable between March 2018 in October 2019 However as explained this can become unpredictably progressive especially over time Therefore strategies to prevent potential progression is important  Plan -At this time I recommend you continue Imuran that was prescribed by Dr. Keturah Barre for your joint pain -There is evidence of the midline is also lung protective -We will continue to monitor your situation with serial clinical visits lung function and flexible approach to CT scan -If you have intolerance to Imuran or this potential for progression then we can add nintedanib which is been in the market since October 2014 for another type of pulmonary fibrosis call IPF (nintedanib was recently approved for scleroderma interstitial lung disease] -At this point in time he did not need oxygen therapy -Continue to keep yourselff it -High-dose flu shot today -Pneumovax-23 today  Follow-up -In 6 months do spirometry and DLCO test -Return to ILD clinic with Dr Chase Caller on Tuesday afternoon or Thursday Morning in 6 months  - simple walk test (not 6 mwd) to be done at followup - YES

## 2017-11-13 ENCOUNTER — Other Ambulatory Visit: Payer: Self-pay | Admitting: *Deleted

## 2017-11-13 ENCOUNTER — Telehealth: Payer: Self-pay | Admitting: Internal Medicine

## 2017-11-13 DIAGNOSIS — Z79899 Other long term (current) drug therapy: Secondary | ICD-10-CM

## 2017-11-13 LAB — CBC WITH DIFFERENTIAL/PLATELET
BASOS ABS: 50 {cells}/uL (ref 0–200)
BASOS PCT: 0.7 %
EOS ABS: 137 {cells}/uL (ref 15–500)
Eosinophils Relative: 1.9 %
HEMATOCRIT: 39 % (ref 35.0–45.0)
HEMOGLOBIN: 12.5 g/dL (ref 11.7–15.5)
LYMPHS ABS: 1498 {cells}/uL (ref 850–3900)
MCH: 28.7 pg (ref 27.0–33.0)
MCHC: 32.1 g/dL (ref 32.0–36.0)
MCV: 89.4 fL (ref 80.0–100.0)
MONOS PCT: 13.3 %
MPV: 11.4 fL (ref 7.5–12.5)
NEUTROS ABS: 4558 {cells}/uL (ref 1500–7800)
Neutrophils Relative %: 63.3 %
Platelets: 315 10*3/uL (ref 140–400)
RBC: 4.36 10*6/uL (ref 3.80–5.10)
RDW: 12.8 % (ref 11.0–15.0)
Total Lymphocyte: 20.8 %
WBC: 7.2 10*3/uL (ref 3.8–10.8)
WBCMIX: 958 {cells}/uL — AB (ref 200–950)

## 2017-11-13 LAB — COMPLETE METABOLIC PANEL WITH GFR
AG RATIO: 1.8 (calc) (ref 1.0–2.5)
ALT: 10 U/L (ref 6–29)
AST: 15 U/L (ref 10–35)
Albumin: 4.1 g/dL (ref 3.6–5.1)
Alkaline phosphatase (APISO): 58 U/L (ref 33–130)
BUN: 11 mg/dL (ref 7–25)
CALCIUM: 9.4 mg/dL (ref 8.6–10.4)
CO2: 30 mmol/L (ref 20–32)
CREATININE: 0.82 mg/dL (ref 0.60–0.93)
Chloride: 104 mmol/L (ref 98–110)
GFR, EST AFRICAN AMERICAN: 83 mL/min/{1.73_m2} (ref >=60)
GFR, Est Non African American: 71 mL/min/{1.73_m2} (ref >=60)
Globulin: 2.3 g/dL (calc) (ref 1.9–3.7)
Glucose, Bld: 90 mg/dL (ref 65–99)
Potassium: 4.7 mmol/L (ref 3.5–5.3)
Sodium: 140 mmol/L (ref 135–146)
TOTAL PROTEIN: 6.4 g/dL (ref 6.1–8.1)
Total Bilirubin: 0.3 mg/dL (ref 0.2–1.2)

## 2017-11-13 NOTE — Telephone Encounter (Signed)
Obtained paper work, ILD questionaire. Placed in MR cubby hole for review. Will route to MR and Raquel Sarna as FYI. Nothing further is needed at this time.

## 2017-11-16 ENCOUNTER — Telehealth: Payer: Self-pay | Admitting: *Deleted

## 2017-11-16 NOTE — Telephone Encounter (Signed)
Patient contacted the office asking about the Hepatitis B vaccination. Patient states she went to the pharmacy to receive the vaccination and was not sure which one to get when the pharmacist ask her. Patient advised to contact PCP to get their advice. Patient verbalized understanding.

## 2017-11-16 NOTE — Telephone Encounter (Signed)
Thank you and I have updated the history

## 2017-11-16 NOTE — Progress Notes (Signed)
Labs are WNL.

## 2017-12-03 ENCOUNTER — Telehealth: Payer: Self-pay | Admitting: Pharmacist

## 2017-12-03 NOTE — Telephone Encounter (Signed)
Patient called with questions about the hepatitis B vaccine she went to her primary care provider who instructed her to go to the health department.  She had difficulty with the process at the health department.  Her PCP does not have the vaccine in his office.  She was then instructed to go to the pharmacy where they told her that the vaccine would be $150.  Patient asked if we wrote a prescription would that make a difference.  Informed patient that it would not make a difference but she should call her insurance company to inquire about hepatitis B coverage under both her medical and prescription insurance.  Patient voiced understanding.  She will call her insurance company about hepatitis B coverage.  All questions encouraged and answered.

## 2017-12-09 ENCOUNTER — Other Ambulatory Visit: Payer: Self-pay

## 2017-12-09 ENCOUNTER — Telehealth: Payer: Self-pay | Admitting: Pharmacist

## 2017-12-09 DIAGNOSIS — Z79899 Other long term (current) drug therapy: Secondary | ICD-10-CM

## 2017-12-09 LAB — CBC WITH DIFFERENTIAL/PLATELET
Basophils Absolute: 37 cells/uL (ref 0–200)
Basophils Relative: 0.6 %
EOS ABS: 161 {cells}/uL (ref 15–500)
EOS PCT: 2.6 %
HCT: 40 % (ref 35.0–45.0)
HEMOGLOBIN: 13 g/dL (ref 11.7–15.5)
Lymphs Abs: 998 cells/uL (ref 850–3900)
MCH: 29 pg (ref 27.0–33.0)
MCHC: 32.5 g/dL (ref 32.0–36.0)
MCV: 89.3 fL (ref 80.0–100.0)
MONOS PCT: 11.4 %
MPV: 11.3 fL (ref 7.5–12.5)
NEUTROS ABS: 4297 {cells}/uL (ref 1500–7800)
Neutrophils Relative %: 69.3 %
PLATELETS: 349 10*3/uL (ref 140–400)
RBC: 4.48 10*6/uL (ref 3.80–5.10)
RDW: 13 % (ref 11.0–15.0)
TOTAL LYMPHOCYTE: 16.1 %
WBC mixed population: 707 cells/uL (ref 200–950)
WBC: 6.2 10*3/uL (ref 3.8–10.8)

## 2017-12-09 LAB — COMPLETE METABOLIC PANEL WITH GFR
AG Ratio: 2 (calc) (ref 1.0–2.5)
ALT: 12 U/L (ref 6–29)
AST: 19 U/L (ref 10–35)
Albumin: 4.1 g/dL (ref 3.6–5.1)
Alkaline phosphatase (APISO): 59 U/L (ref 33–130)
BUN / CREAT RATIO: 10 (calc) (ref 6–22)
BUN: 10 mg/dL (ref 7–25)
CO2: 31 mmol/L (ref 20–32)
CREATININE: 0.96 mg/dL — AB (ref 0.60–0.93)
Calcium: 9.5 mg/dL (ref 8.6–10.4)
Chloride: 105 mmol/L (ref 98–110)
GFR, EST NON AFRICAN AMERICAN: 59 mL/min/{1.73_m2} — AB (ref >=60)
GFR, Est African American: 68 mL/min/{1.73_m2} (ref >=60)
GLOBULIN: 2.1 g/dL (ref 1.9–3.7)
GLUCOSE: 98 mg/dL (ref 65–99)
Potassium: 4.6 mmol/L (ref 3.5–5.3)
SODIUM: 141 mmol/L (ref 135–146)
TOTAL PROTEIN: 6.2 g/dL (ref 6.1–8.1)
Total Bilirubin: 0.4 mg/dL (ref 0.2–1.2)

## 2017-12-09 NOTE — Telephone Encounter (Signed)
Patient called about Hepatitis B vaccine.  She tried to receive the vaccine from her local pharmacy and health department but would cost $150.  Insurance company informed her it would be covered under her medical plan at no cost.  She went to her PCP who told her they could not order/administer the vaccine in her office.    She is very frustrated as she has been trying to get the vaccine for over a month.  She is currently only on Imuran and not on any injectable medications.  Told her if she does not want to continue to pursue the vaccine at this time that is okay.  Patient verbalized understanding.  Will revisit if patient is started on injectable medication.

## 2017-12-10 NOTE — Progress Notes (Signed)
Creatinine borderline elevated. GFR borderline low.  Please advise patient to avoid NSAIDs.  We will continue to monitor.  All other labs are WNL.

## 2017-12-23 ENCOUNTER — Other Ambulatory Visit: Payer: Self-pay | Admitting: Physician Assistant

## 2017-12-23 NOTE — Telephone Encounter (Signed)
Last Visit: 10/29/17 Next Visit: 01/18/18 Labs: 12/09/17 Creatinine borderline elevated. GFR borderline low. We will continue to monitor. All other labs are WNL  Okay to refill per Dr. Estanislado Pandy

## 2018-01-04 NOTE — Progress Notes (Signed)
Office Visit Note  Patient: Lindsay Herman             Date of Birth: 07-Jul-1945           MRN: [id number]             PCP: Burnard Bunting, MD Referring: Burnard Bunting, MD Visit Date: 01/18/2018 Occupation: @GUAROCC @  Subjective:  Right wrist pain    History of Present Illness: Lindsay Herman is a 72 y.o. female with history of seropositive rheumatoid arthritis, interstitial lung disease, scleroderma, and osteoarthritis.  She is taking Imuran 50 mg 1 tablet by mouth daily.  She was started on Imuran on 10/29/17. She continues to have intermittent right wrist pain and swelling.  She continues to have intermittent symptoms of Raynaud's.  She says she wears gloves on a regular basis.  She denies any increased skin tightness.  She denies any dysphagia.  She reports that she followed up with Dr. Chase Caller on 11/10/2017.  She had a chest x-ray on 10/19/2017 and PFTs on 11/10/2017.  She denies any shortness of breath or coughing at this time.  She will be following up Dr. Chase Caller in 6 months. She has been having intermittent bilateral trochanteric bursitis.     Activities of Daily Living:  Patient reports morning stiffness for 2 minutes.   Patient Reports nocturnal pain.  Difficulty dressing/grooming: Denies Difficulty climbing stairs: Denies Difficulty getting out of chair: Denies Difficulty using hands for taps, buttons, cutlery, and/or writing: Reports  Review of Systems  Constitutional: Positive for fatigue.  HENT: Negative for mouth sores, trouble swallowing, trouble swallowing, mouth dryness and nose dryness.   Eyes: Negative for pain, visual disturbance and dryness.  Respiratory: Negative for cough, hemoptysis, shortness of breath and difficulty breathing.   Cardiovascular: Negative for chest pain, palpitations, hypertension and swelling in legs/feet.  Gastrointestinal: Positive for constipation. Negative for blood in stool and diarrhea.  Endocrine: Negative for increased  urination.  Genitourinary: Negative for painful urination.  Musculoskeletal: Positive for arthralgias, joint pain, joint swelling and morning stiffness. Negative for myalgias, muscle weakness, muscle tenderness and myalgias.  Skin: Positive for color change. Negative for pallor, rash, hair loss, nodules/bumps, skin tightness, ulcers and sensitivity to sunlight.  Allergic/Immunologic: Negative for susceptible to infections.  Neurological: Negative for dizziness, numbness, headaches and weakness.  Hematological: Negative for swollen glands.  Psychiatric/Behavioral: Positive for depressed mood. Negative for sleep disturbance. The patient is not nervous/anxious.     PMFS History:  Patient Active Problem List   Diagnosis Date Noted  . Scleroderma, limited ([place]) 02/10/2016  . History of environmental allergies 02/10/2016  . Gastroesophageal reflux disease 02/10/2016  . History of colon cancer 02/10/2016  . Sicca syndrome 02/01/2016  . History of repair of right rotator cuff 02/01/2016  . Primary osteoarthritis of both knees 02/01/2016  . Primary osteoarthritis of both hands 02/01/2016  . Raynaud's disease without gangrene 02/01/2016  . Cystocele   . Atrophic vaginitis   . Osteopenia   . Menopausal symptoms     Past Medical History:  Diagnosis Date  . Atrophic vaginitis   . Cancer [facility])    Colon cancer  . Contact lens/glasses fitting    wears contacts or glasses  . Cystocele   . Osteopenia 06/2017   T score -2.0 FRAX 13% / 2.8%  . PONV (postoperative nausea and vomiting)   . Raynaud's disease   . Rectocele   . Rosacea   . Scleroderma ([place])   . Urinary frequency  Family History  Problem Relation Age of Onset  . Diabetes Mother   . Heart disease Mother   . Stroke Mother   . Diabetes Brother   . Hypertension Brother   . Lupus Father    Past Surgical History:  Procedure Laterality Date  . APPENDECTOMY    . colon cancer    . COLON RESECTION  1995  . MASS EXCISION Left  08/31/2012   Procedure: EXCISION MUCOID CYST DEBRIDEMENT DISTAL INTERPHALANGEAL LEFT MIDDLE FINGER;  Surgeon: Wynonia Sours, MD;  Location: [facility];  Service: Orthopedics;  Laterality: Left;  . NOSE SURGERY    . OOPHORECTOMY     LSO  . ROTATOR CUFF REPAIR    . TONSILLECTOMY    . VAGINAL HYSTERECTOMY  1994   Social History   Social History Narrative  . Not on file   Immunization History  Administered Date(s) Administered  . Influenza Split 12/12/2010  . Influenza, High Dose Seasonal PF 10/24/2017  . Pneumococcal Conjugate-13 07/24/2014  . Pneumococcal Polysaccharide-23 01/31/2009    Objective: Vital Signs: BP 115/78 (BP Location: Left Arm, Patient Position: Sitting, Cuff Size: Normal)   Pulse 83   Resp 13   Ht 5\' 7"  (1.702 m)   Wt 164 lb 9.6 oz (74.7 kg)   BMI 25.78 kg/m    Physical Exam  Constitutional: She is oriented to person, place, and time. She appears well-developed and well-nourished.  HENT:  Head: Normocephalic and atraumatic.  No parotid swelling.    Eyes: Conjunctivae and EOM are normal.  Neck: Normal range of motion.  Cardiovascular: Normal rate, regular rhythm, normal heart sounds and intact distal pulses.  Pulmonary/Chest: Effort normal and breath sounds normal.  Abdominal: Soft. Bowel sounds are normal.  Lymphadenopathy:    She has no cervical adenopathy.  Neurological: She is alert and oriented to person, place, and time.  Skin: Skin is warm and dry. Capillary refill takes less than 2 seconds.  Psychiatric: She has a normal mood and affect. Her behavior is normal.  Nursing note and vitals reviewed.    Musculoskeletal Exam: C-spine, thoracic spine, and lumbar spine good ROM.  No midline spinal tenderness. No SI joint tenderness.  Shoulder joints and elbow joints good ROM.  Right wrist limited extension and flexion.  Left wrist good ROM. Complete fist  Synovitis of right wrist joint. PIP and DIP synovial thickening.  Hip joints, knee  joints, ankle joints, MTPs, PIPs, and DIPs good ROM.  No warmth or effusion of knee joints.  No tenderness or swelling of knee joints.   CDAI Exam: CDAI Score: 3.6  Patient Global Assessment: 3 (mm); Provider Global Assessment: 3 (mm) Swollen: 1 ; Tender: 2  Joint Exam      Right  Left  Wrist  Swollen Tender     PIP 4   Tender        Investigation: No additional findings.  Imaging: No results found.  Recent Labs: Lab Results  Component Value Date   WBC 6.2 12/09/2017   HGB 13.0 12/09/2017   PLT 349 12/09/2017   NA 141 12/09/2017   K 4.6 12/09/2017   CL 105 12/09/2017   CO2 31 12/09/2017   GLUCOSE 98 12/09/2017   BUN 10 12/09/2017   CREATININE 0.96 (H) 12/09/2017   BILITOT 0.4 12/09/2017   ALKPHOS 61 02/12/2016   AST 19 12/09/2017   ALT 12 12/09/2017   PROT 6.2 12/09/2017   ALBUMIN 4.1 02/12/2016   CALCIUM 9.5 12/09/2017  GFRAA 68 12/09/2017   QFTBGOLDPLUS NEGATIVE 10/06/2017    Speciality Comments: No specialty comments available.  Procedures:  No procedures performed Allergies: Codeine and Morphine and related   Trochanteric bursitis intermittently, especially when laying on sides at night  Started on Imuran on 10/29/17  Chest CT 10/19/17 ILD, repeat in 1 year Echo 04/16/17, she has not established care with cardiologist yet Dr. Chase Caller, ILD stable since March 2018 PFT in 11/10/17   X-ray of bilateral hands taken on 10/06/2017.  Assessment / Plan:     Visit Diagnoses: Seronegative rheumatoid arthritis ([place]) -  RF-, CCP-, 14-3-3 eta negative, R wrist synovitis, erosive changes, intercarpal and radiocarpal joint space narrowing: She continues to have right wrist synovitis.  She was started on Imuran 50 mg 1 tablet by mouth daily on 10/19/2017.  She declined a right wrist cortisone injection today in the office.  Due to her history of scleroderma she would not be started on a prednisone taper.  She will increase her dose of Imuran to 100 mg by mouth daily.  She was  advised to notify us if she develops any side effects.  She will have lab work in 2 weeks and then in 2 months.  A refill of Imuran was sent to the pharmacy today.  She was advised to notify us that she continues to have joint pain and joint swelling.  She will follow-up in the office in 3 months.  High risk medication use - Imuran 100 mg po daily.  Most recent CBC/CMP within normal limits except elevated creatinine on 12/09/2017. Standing orders are in place.  She will return for lab work in 2 weeks then 2 months.    TPMT intermediate metabolizer ([place])  Interstitial lung disease ([place]): She had a chest CT on 10/19/2017 that was consistent with interstitial lung disease.  She will have a repeat chest CT in 1 year.  PFT performed on 11/10/2017.  She continues to follow-up with Dr. Chase Caller every 6 months.  He was in agreement to continue on Imuran.  ILD has been stable since March 2018.  She had an echocardiogram on 04/16/2017.  She has not established with a cardiologist yet but is planning to. She will be increasing to Imuran 100 mg by mouth daily.  Scleroderma, limited ([place]) - Sclerodactyly unchanged.  She continues have intermittent symptoms of Raynaud's.  No digital ulcerations or signs of gangrene noted.  She has not had any recent dysphagia.  She has occasional constipation but no blood in her stool.  She had a chest CT on 10/19/2017 revealed findings consistent with interstitial lung disease.  She had PFT on 11/10/2017.  She continues to follow-up with Dr. Chase Caller every 6 months.  She will have routine chest CT and PFTs on a yearly basis.  She is encouraged to establish care with a cardiologist.  She had an echocardiogram on 04/16/16.  She was started on Imuran on 10/29/2017.  She is going to be increasing her dose of Imuran to 100 mg by mouth daily.  Sicca syndrome: She denies any sicca symptoms at this time.  She has no parotid swelling on exam.   Raynaud's disease without gangrene: She continues to have  intermittent symptoms of Raynolds.  No digital ulcerations or signs of gangrene were noted.  She wears gloves on a regular basis.  We discussed the importance of keeping her core body temperature warm and to wear gloves and socks on a regular basis.  Primary osteoarthritis of both hands: She  has PIP and DIP synovial thickening consistent with osteoarthritis of bilateral hands.  Joint protection and muscle strengthening were discussed.  Primary osteoarthritis of both knees: No warmth or effusion.  Good range of motion with no discomfort.  History of repair of right rotator cuff: She has good range of motion with no discomfort at this time.  Other medical conditions are listed as follows:  History of colon cancer  History of gastroesophageal reflux (GERD)   Orders: No orders of the defined types were placed in this encounter.  Meds ordered this encounter  Medications  . DISCONTD: azathioprine (IMURAN) 100 MG tablet    Sig: Take 1 tablet (100 mg total) by mouth daily.    Dispense:  30 tablet    Refill:  2  . azaTHIOprine (IMURAN) 50 MG tablet    Sig: Take 2 tablets by mouth daily.    Dispense:  60 tablet    Refill:  2    Fae-to-face time spent with patient was 30 minutes. Greater than 50% of time was spent in counseling and coordination of care.  Follow-Up Instructions: Return in about 3 months (around 04/19/2018) for Rheumatoid arthritis, Scleroderma-limited, Osteoarthritis.   Hazel Sams PA-C  I examined and evaluated the patient with Hazel Sams PA.  Patient has synovitis in her right wrist on my examination today.  She has noticed improvement in her symptoms on Imuran but not completely resolved.  She states her interstitial lung disease has been stable.  We also reviewed records.  For discussing indication side effects contraindications we decided to increase her Imuran dose to 100 mg p.o. daily.  She will have labs in 2 weeks and then every 2 months to monitor for drug toxicity.   The plan of care was discussed as noted above.  Bo Merino, MD  Note - This record has been created using Editor, commissioning.  Chart creation errors have been sought, but may not always  have been located. Such creation errors do not reflect on  the standard of medical care.

## 2018-01-18 ENCOUNTER — Encounter: Payer: Self-pay | Admitting: Physician Assistant

## 2018-01-18 ENCOUNTER — Ambulatory Visit: Payer: Medicare Other | Admitting: Rheumatology

## 2018-01-18 ENCOUNTER — Telehealth: Payer: Self-pay | Admitting: Rheumatology

## 2018-01-18 VITALS — BP 115/78 | HR 83 | Resp 13 | Ht 67.0 in | Wt 164.6 lb

## 2018-01-18 DIAGNOSIS — I7300 Raynaud's syndrome without gangrene: Secondary | ICD-10-CM

## 2018-01-18 DIAGNOSIS — R682 Dry mouth, unspecified: Secondary | ICD-10-CM

## 2018-01-18 DIAGNOSIS — M19042 Primary osteoarthritis, left hand: Secondary | ICD-10-CM

## 2018-01-18 DIAGNOSIS — M0600 Rheumatoid arthritis without rheumatoid factor, unspecified site: Secondary | ICD-10-CM

## 2018-01-18 DIAGNOSIS — M170 Bilateral primary osteoarthritis of knee: Secondary | ICD-10-CM

## 2018-01-18 DIAGNOSIS — M19041 Primary osteoarthritis, right hand: Secondary | ICD-10-CM

## 2018-01-18 DIAGNOSIS — J849 Interstitial pulmonary disease, unspecified: Secondary | ICD-10-CM

## 2018-01-18 DIAGNOSIS — E8889 Other specified metabolic disorders: Secondary | ICD-10-CM | POA: Diagnosis not present

## 2018-01-18 DIAGNOSIS — Z79899 Other long term (current) drug therapy: Secondary | ICD-10-CM | POA: Diagnosis not present

## 2018-01-18 DIAGNOSIS — Z9889 Other specified postprocedural states: Secondary | ICD-10-CM

## 2018-01-18 DIAGNOSIS — Z85038 Personal history of other malignant neoplasm of large intestine: Secondary | ICD-10-CM

## 2018-01-18 DIAGNOSIS — Z8719 Personal history of other diseases of the digestive system: Secondary | ICD-10-CM

## 2018-01-18 DIAGNOSIS — M349 Systemic sclerosis, unspecified: Secondary | ICD-10-CM

## 2018-01-18 MED ORDER — AZATHIOPRINE 50 MG PO TABS: ORAL_TABLET | ORAL | 2 refills | Status: DC

## 2018-01-18 MED ORDER — AZATHIOPRINE 100 MG PO TABS: 100.0000 mg | ORAL_TABLET | Freq: Every day | ORAL | 2 refills | Status: DC

## 2018-01-18 NOTE — Patient Instructions (Addendum)
Increase dose of Imuran 50 mg 2 tablets by mouth daily.  Call us if you experience any side effects with increased dose  Standing Labs We placed an order today for your standing lab work.    Please come back and get your standing labs in 2 weeks then 2 months  We have open lab Monday through Friday from 8:30-11:30 AM and 1:30-4:00 PM  at the office of Dr. Bo Merino.   You may experience shorter wait times on Monday and Friday afternoons. The office is located at [address], [place], [place], [place] [address] No appointment is necessary.   Labs are drawn by [facility].  You may receive a bill from [facility] for your lab work. If you have any questions regarding directions or hours of operation,  please call [phone].   Just as a reminder please drink plenty of water prior to coming for your lab work. Thanks!   Trochanteric Bursitis Rehab Ask your health care provider which exercises are safe for you. Do exercises exactly as told by your health care provider and adjust them as directed. It is normal to feel mild stretching, pulling, tightness, or discomfort as you do these exercises, but you should stop right away if you feel sudden pain or your pain gets worse.Do not begin these exercises until told by your health care provider. Stretching exercises These exercises warm up your muscles and joints and improve the movement and flexibility of your hip. These exercises also help to relieve pain and stiffness. Exercise A: Iliotibial band stretch  1. Lie on your side with your left / right leg in the top position. 2. Bend your left / right knee and grab your ankle. 3. Slowly bring your knee back so your thigh is behind your body. 4. Slowly lower your knee toward the floor until you feel a gentle stretch on the outside of your left / right thigh. If you do not feel a stretch and your knee will not fall farther, place the heel of your other foot on top of your outer knee and pull your  thigh down farther. 5. Hold this position for __________ seconds. 6. Slowly return to the starting position. Repeat __________ times. Complete this exercise __________ times a day. Strengthening exercises These exercises build strength and endurance in your hip and pelvis. Endurance is the ability to use your muscles for a long time, even after they get tired. Exercise B: Bridge ( hip extensors) 1. Lie on your back on a firm surface with your knees bent and your feet flat on the floor. 2. Tighten your buttocks muscles and lift your buttocks off the floor until your trunk is level with your thighs. You should feel the muscles working in your buttocks and the back of your thighs. If this exercise is too easy, try doing it with your arms crossed over your chest. 3. Hold this position for __________ seconds. 4. Slowly return to the starting position. 5. Let your muscles relax completely between repetitions. Repeat __________ times. Complete this exercise __________ times a day. Exercise C: Squats ( knee extensors and  quadriceps) 1. Stand in front of a table, with your feet and knees pointing straight ahead. You may rest your hands on the table for balance but not for support. 2. Slowly bend your knees and lower your hips like you are going to sit in a chair. ? Keep your weight over your heels, not over your toes. ? Keep your lower legs upright so they  are parallel with the table legs. ? Do not let your hips go lower than your knees. ? Do not bend lower than told by your health care provider. ? If your hip pain increases, do not bend as low. 3. Hold this position for __________ seconds. 4. Slowly push with your legs to return to standing. Do not use your hands to pull yourself to standing. Repeat __________ times. Complete this exercise __________ times a day. Exercise D: Hip hike 1. Stand sideways on a bottom step. Stand on your left / right leg with your other foot unsupported next to the  step. You can hold onto the railing or wall if needed for balance. 2. Keeping your knees straight and your torso square, lift your left / right hip up toward the ceiling. 3. Hold this position for __________ seconds. 4. Slowly let your left / right hip lower toward the floor, past the starting position. Your foot should get closer to the floor. Do not lean or bend your knees. Repeat __________ times. Complete this exercise __________ times a day. Exercise E: Single leg stand 1. Stand near a counter or door frame that you can hold onto for balance as needed. It is helpful to stand in front of a mirror for this exercise so you can watch your hip. 2. Squeeze your left / right buttock muscles then lift up your other foot. Do not let your left / right hip push out to the side. 3. Hold this position for __________ seconds. Repeat __________ times. Complete this exercise __________ times a day. This information is not intended to replace advice given to you by your health care provider. Make sure you discuss any questions you have with your health care provider. Document Released: 03/06/2004 Document Revised: 10/04/2015 Document Reviewed: 01/12/2015 Elsevier Interactive Patient Education  [employer].

## 2018-01-18 NOTE — Telephone Encounter (Signed)
Patient called requesting a return call to discuss the new prescription of Imuran.  Patient states Dr. Estanislado Pandy called in a new prescription with change in dosage and the price went from $9.60 for 60 tablets to $48.00 for 30 tablets.

## 2018-01-18 NOTE — Telephone Encounter (Signed)
Patient advised she does not need to pick up the 100 mg tablets. Patient advised we have cancelled that prescription. Patient states she was able to easily break the tablet in half. Per Lovena Le okay for the patient to start with Imuran 75 mg daily.

## 2018-02-05 ENCOUNTER — Other Ambulatory Visit: Payer: Self-pay

## 2018-02-05 DIAGNOSIS — Z79899 Other long term (current) drug therapy: Secondary | ICD-10-CM

## 2018-02-05 LAB — COMPLETE METABOLIC PANEL WITH GFR
AG RATIO: 1.7 (calc) (ref 1.0–2.5)
ALT: 8 U/L (ref 6–29)
AST: 15 U/L (ref 10–35)
Albumin: 4 g/dL (ref 3.6–5.1)
Alkaline phosphatase (APISO): 62 U/L (ref 33–130)
BUN/Creatinine Ratio: 11 (calc) (ref 6–22)
BUN: 10 mg/dL (ref 7–25)
CALCIUM: 9.6 mg/dL (ref 8.6–10.4)
CO2: 31 mmol/L (ref 20–32)
CREATININE: 0.94 mg/dL — AB (ref 0.60–0.93)
Chloride: 103 mmol/L (ref 98–110)
GFR, EST AFRICAN AMERICAN: 70 mL/min/{1.73_m2} (ref >=60)
GFR, EST NON AFRICAN AMERICAN: 60 mL/min/{1.73_m2} (ref >=60)
Globulin: 2.3 g/dL (calc) (ref 1.9–3.7)
Glucose, Bld: 123 mg/dL — ABNORMAL HIGH (ref 65–99)
POTASSIUM: 4.3 mmol/L (ref 3.5–5.3)
Sodium: 139 mmol/L (ref 135–146)
TOTAL PROTEIN: 6.3 g/dL (ref 6.1–8.1)
Total Bilirubin: 0.4 mg/dL (ref 0.2–1.2)

## 2018-02-05 LAB — CBC WITH DIFFERENTIAL/PLATELET
ABSOLUTE MONOCYTES: 546 {cells}/uL (ref 200–950)
BASOS ABS: 42 {cells}/uL (ref 0–200)
Basophils Relative: 0.8 %
EOS ABS: 88 {cells}/uL (ref 15–500)
Eosinophils Relative: 1.7 %
HEMATOCRIT: 41.1 % (ref 35.0–45.0)
HEMOGLOBIN: 13.3 g/dL (ref 11.7–15.5)
Lymphs Abs: 998 cells/uL (ref 850–3900)
MCH: 29.4 pg (ref 27.0–33.0)
MCHC: 32.4 g/dL (ref 32.0–36.0)
MCV: 90.7 fL (ref 80.0–100.0)
MONOS PCT: 10.5 %
MPV: 11 fL (ref 7.5–12.5)
NEUTROS ABS: 3526 {cells}/uL (ref 1500–7800)
Neutrophils Relative %: 67.8 %
Platelets: 349 10*3/uL (ref 140–400)
RBC: 4.53 10*6/uL (ref 3.80–5.10)
RDW: 15.6 % — ABNORMAL HIGH (ref 11.0–15.0)
Total Lymphocyte: 19.2 %
WBC: 5.2 10*3/uL (ref 3.8–10.8)

## 2018-02-08 ENCOUNTER — Telehealth: Payer: Self-pay | Admitting: Pharmacist

## 2018-02-08 NOTE — Telephone Encounter (Signed)
Patient called about heartburn over the past three days and wanted to know if increasing Imuran could be the cause. Patient increased Imuran on 01/18/18.  Informed patient it is doubtful Imuran would be causing her heartburn and could be due to rich indulgent foods over the holidays.   She has been taking pepto-bismol and tums for relief. Encouraged patient to take famotidine 20 mg with dinner for a week or two and may increase to twice daily as needed instead of pepto-bismol.  Patient verbalized understanding.  All questions encouraged and answered.  Instructed patient to call with any further questions or concerns.  Mariella Saa, PharmD, [facility] Clinical Pharmacist  02/08/2018 3:58 PM

## 2018-02-08 NOTE — Progress Notes (Signed)
Glucose is 123.  Creatinine is borderline elevated and GFR is WNL.  CBC WNL

## 2018-02-22 ENCOUNTER — Encounter: Payer: Self-pay | Admitting: Gynecology

## 2018-02-26 ENCOUNTER — Telehealth: Payer: Self-pay | Admitting: Rheumatology

## 2018-02-26 DIAGNOSIS — M0600 Rheumatoid arthritis without rheumatoid factor, unspecified site: Secondary | ICD-10-CM

## 2018-02-26 DIAGNOSIS — J849 Interstitial pulmonary disease, unspecified: Secondary | ICD-10-CM

## 2018-02-26 DIAGNOSIS — M349 Systemic sclerosis, unspecified: Secondary | ICD-10-CM

## 2018-02-26 NOTE — Telephone Encounter (Signed)
Referral placed and patient advised.

## 2018-02-26 NOTE — Telephone Encounter (Signed)
Patient called stating at her last appointment with Dr. Estanislado Pandy it was recommended that she make an appointment with a cardiologist.  Patient is requesting a referral be sent to Dr. Dorris Carnes at [facility] on [facility].  Patient states their office requires a physician referral faxed to [phone].

## 2018-03-05 ENCOUNTER — Encounter: Payer: Self-pay | Admitting: Gynecology

## 2018-03-05 ENCOUNTER — Ambulatory Visit: Payer: Medicare Other | Admitting: Gynecology

## 2018-03-05 VITALS — BP 118/76 | Ht 66.5 in | Wt 164.0 lb

## 2018-03-05 DIAGNOSIS — N8111 Cystocele, midline: Secondary | ICD-10-CM

## 2018-03-05 DIAGNOSIS — N952 Postmenopausal atrophic vaginitis: Secondary | ICD-10-CM

## 2018-03-05 DIAGNOSIS — Z01419 Encounter for gynecological examination (general) (routine) without abnormal findings: Secondary | ICD-10-CM

## 2018-03-05 DIAGNOSIS — M8580 Other specified disorders of bone density and structure, unspecified site: Secondary | ICD-10-CM

## 2018-03-05 NOTE — Patient Instructions (Signed)
Follow-up in 1 year for annual exam, sooner if any issues. 

## 2018-03-05 NOTE — Addendum Note (Signed)
Addended by: Nelva Nay on: 03/05/2018 01:30 PM   Modules accepted: Orders

## 2018-03-05 NOTE — Progress Notes (Signed)
    ROSILAND SEN 08-14-1945 [id number]        73 y.o.  [id number] for annual gynecologic exam.  Without gynecologic complaints  Past medical history,surgical history, problem list, medications, allergies, family history and social history were all reviewed and documented as reviewed in the EPIC chart.  ROS:  Performed with pertinent positives and negatives included in the history, assessment and plan.   Additional significant findings : None   Exam: Caryn Bee assistant Vitals:   03/05/18 1253  BP: 118/76  Weight: 164 lb (74.4 kg)  Height: 5' 6.5" (1.689 m)   Body mass index is 26.07 kg/m.  General appearance:  Normal affect, orientation and appearance. Skin: Grossly normal HEENT: Without gross lesions.  No cervical or supraclavicular adenopathy. Thyroid normal.  Lungs:  Clear without wheezing, rales or rhonchi Cardiac: RR, without RMG Abdominal:  Soft, nontender, without masses, guarding, rebound, organomegaly or hernia Breasts:  Examined lying and sitting without masses, retractions, discharge or axillary adenopathy. Pelvic:  Ext, BUS, Vagina: With atrophic changes.  Pap smear of vaginal cuff done.  Second-degree cystocele.  Mild rectocele.  Cuff well supported  Adnexa: Without masses or tenderness    Anus and perineum: Normal   Rectovaginal: Normal sphincter tone without palpated masses or tenderness.    Assessment/Plan:  73 y.o. G32P2002 female for annual gynecologic exam.   1. Postmenopausal.  Status post TVH LSO in the past.  No significant menopausal symptoms.  Had been on HRT but discontinued. 2. Cystocele stable on serial exams and asymptomatic to the patient. 3. Osteopenia.  DEXA 06/2017 T score -2.0 FRAX 13% / 2.8%.  Plan repeat DEXA next year at 2-year interval. 4. Mammography this month.  Continue with annual mammography next year.  Breast exam normal today. 5. Colonoscopy 2015.  Repeat at their recommended interval. 6. Pap smear 2012.  Pap smear of vaginal cuff  done today as patient has started on immunosuppression for rheumatoid arthritis/scleroderma.  No history of abnormal Pap smears previously. 7. History of detrusor instability.  Had been on Myrbetriq but discontinued and is actually doing well without symptoms. 8. Health maintenance.  No routine lab work done as patient does this elsewhere.  Follow-up 1 year, sooner as needed.   [name] MD, 1:19 PM 03/05/2018

## 2018-03-10 LAB — PAP IG W/ RFLX HPV ASCU

## 2018-04-06 NOTE — Progress Notes (Signed)
Office Visit Note  Patient: Lindsay Herman             Date of Birth: 05-Jan-1946           MRN: [id number]             PCP: Burnard Bunting, MD Referring: Burnard Bunting, MD Visit Date: 04/20/2018 Occupation: @GUAROCC @  Subjective:  Right wrist pain.    History of Present Illness: Lindsay Herman is a 73 y.o. female with history of seropositive rheumatoid arthritis, interstitial lung disease, scleroderma, and osteoarthritis. Patient states she has some right wrist swelling as a result of an injury 1.5 week ago. She reports her right wrist pain and swelling have improved since her last visit. Denies other joint swelling or joint stiffness. She has intermittent knee joint discomfort and pain, mostly in the morning. She currently has muscle tenderness of her left buttock, which she feels she might have pulled a muscle during yoga. She denies any shortness of breath. She reports her Raynaud's is stable and denies any ulcerations.  Appointment coming up with Dr. Chase Caller in April.  Activities of Daily Living:  Patient reports morning stiffness for 5 minutes.   Patient Denies nocturnal pain.  Difficulty dressing/grooming: Denies Difficulty climbing stairs: Denies Difficulty getting out of chair: Denies Difficulty using hands for taps, buttons, cutlery, and/or writing: Reports  Review of Systems  Constitutional: Positive for fatigue.  HENT: Negative for mouth sores, mouth dryness and nose dryness.   Eyes: Negative for itching and dryness.  Respiratory: Negative for shortness of breath, wheezing and difficulty breathing.   Cardiovascular: Negative for chest pain, hypertension and irregular heartbeat.  Gastrointestinal: Negative for abdominal pain, constipation and diarrhea.  Endocrine: Positive for excessive thirst. Negative for increased urination.  Genitourinary: Negative for painful urination and pelvic pain.  Musculoskeletal: Positive for arthralgias, joint pain and morning  stiffness. Negative for joint swelling.  Skin: Negative for rash and redness.  Allergic/Immunologic: Negative for susceptible to infections.  Neurological: Negative for dizziness, loss of consciousness, light-headedness and weakness.  Hematological: Negative for swollen glands.  Psychiatric/Behavioral: Negative for confusion and sleep disturbance. The patient is not nervous/anxious.     PMFS History:  Patient Active Problem List   Diagnosis Date Noted  . Scleroderma, limited ([place]) 02/10/2016  . History of environmental allergies 02/10/2016  . Gastroesophageal reflux disease 02/10/2016  . History of colon cancer 02/10/2016  . Sicca syndrome 02/01/2016  . History of repair of right rotator cuff 02/01/2016  . Primary osteoarthritis of both knees 02/01/2016  . Primary osteoarthritis of both hands 02/01/2016  . Raynaud's disease without gangrene 02/01/2016  . Cystocele   . Atrophic vaginitis   . Osteopenia   . Menopausal symptoms     Past Medical History:  Diagnosis Date  . Atrophic vaginitis   . Cancer [facility])    Colon cancer  . Contact lens/glasses fitting    wears contacts or glasses  . Cystocele   . Osteopenia 06/2017   T score -2.0 FRAX 13% / 2.8%  . PONV (postoperative nausea and vomiting)   . Raynaud's disease   . Rectocele   . Rosacea   . Scleroderma ([place])   . Urinary frequency     Family History  Problem Relation Age of Onset  . Diabetes Mother   . Heart disease Mother   . Stroke Mother   . Diabetes Brother   . Hypertension Brother   . Lupus Father    Past Surgical History:  Procedure  Laterality Date  . APPENDECTOMY    . colon cancer    . COLON RESECTION  1995  . MASS EXCISION Left 08/31/2012   Procedure: EXCISION MUCOID CYST DEBRIDEMENT DISTAL INTERPHALANGEAL LEFT MIDDLE FINGER;  Surgeon: Wynonia Sours, MD;  Location: [facility];  Service: Orthopedics;  Laterality: Left;  . NOSE SURGERY    . OOPHORECTOMY     LSO  . ROTATOR CUFF REPAIR    .  TONSILLECTOMY    . VAGINAL HYSTERECTOMY  1994   Social History   Social History Narrative  . Not on file   Immunization History  Administered Date(s) Administered  . Influenza Split 12/12/2010  . Influenza, High Dose Seasonal PF 10/24/2017  . Pneumococcal Conjugate-13 07/24/2014  . Pneumococcal Polysaccharide-23 01/31/2009     Objective: Vital Signs: BP 114/71 (BP Location: Left Arm, Patient Position: Sitting, Cuff Size: Normal)   Pulse 86   Resp 14   Ht 5' 6.75" (1.695 m)   Wt 165 lb 3.2 oz (74.9 kg)   BMI 26.07 kg/m    Physical Exam Vitals signs and nursing note reviewed.  Constitutional:      Appearance: She is well-developed.  HENT:     Head: Normocephalic and atraumatic.  Eyes:     Conjunctiva/sclera: Conjunctivae normal.  Neck:     Musculoskeletal: Normal range of motion.  Cardiovascular:     Rate and Rhythm: Normal rate and regular rhythm.     Heart sounds: Normal heart sounds.  Pulmonary:     Effort: Pulmonary effort is normal.     Breath sounds: Rales present.     Comments:  in bilateral lung bases Abdominal:     General: Bowel sounds are normal.     Palpations: Abdomen is soft.  Lymphadenopathy:     Cervical: No cervical adenopathy.  Skin:    General: Skin is warm and dry.     Capillary Refill: Capillary refill takes less than 2 seconds.     Comments: sclerodactyly  Neurological:     Mental Status: She is alert and oriented to person, place, and time.  Psychiatric:        Behavior: Behavior normal.      Musculoskeletal Exam: C-spine thoracic and lumbar spine good range of motion.  Shoulder joints elbow joints wrist joints with good range of motion.  She is some MCP thickening PIP and DIP thickening but no synovitis.  Hip joints knee joints ankles MTPs PIPs been good range of motion with no synovitis.  CDAI Exam: CDAI Score: 0.4  Patient Global Assessment: 2 (mm); Provider Global Assessment: 2 (mm) Swollen: 0 ; Tender: 0  Joint Exam   Not  documented     Investigation: No additional findings.  Imaging: No results found.  Recent Labs: Lab Results  Component Value Date   WBC 5.2 02/05/2018   HGB 13.3 02/05/2018   PLT 349 02/05/2018   NA 139 02/05/2018   K 4.3 02/05/2018   CL 103 02/05/2018   CO2 31 02/05/2018   GLUCOSE 123 (H) 02/05/2018   BUN 10 02/05/2018   CREATININE 0.94 (H) 02/05/2018   BILITOT 0.4 02/05/2018   ALKPHOS 61 02/12/2016   AST 15 02/05/2018   ALT 8 02/05/2018   PROT 6.3 02/05/2018   ALBUMIN 4.1 02/12/2016   CALCIUM 9.6 02/05/2018   GFRAA 70 02/05/2018   QFTBGOLDPLUS NEGATIVE 10/06/2017    Speciality Comments: No specialty comments available.  Procedures:  No procedures performed Allergies: Codeine and Morphine and related  Assessment / Plan:     Visit Diagnoses: Seronegative rheumatoid arthritis ([place]) -  RF-, CCP-, 14-3-3 eta negative, R wrist synovitis, erosive changes, intercarpal and radiocarpal joint space narrowing: Patient had no synovitis on examination.  She had recent wrist joint injury which was painful and has resolved now.  She has been tolerating increased dose of Imuran well.  She forgot to get her labs and will get labs today.  Plan x-ray of bilateral hands and feet at the next follow-up visit.  High risk medication use - Imuran 75 mg po daily.. - Plan: CBC with Differential/Platelet, COMPLETE METABOLIC PANEL WITH GFR  Scleroderma, limited ([facility]) - Sclerodactyly unchanged, Raynaud's is currently not very active.  Interstitial lung disease ([place]) - CT ILD stable-10/19/17, PFT 11/10/17, Echo 04/16/17, Dr. Ramaswamy q30months.  Patient has appointment coming up in April.  TPMT intermediate metabolizer ([facility])  Sicca syndrome-over-the-counter products were discussed.  Raynaud's disease without gangrene-keeping core temperature warm was discussed.  Primary osteoarthritis of both hands-joint protection and muscle strengthening was discussed.  Primary osteoarthritis of both  knees-she had no synovitis on examination.  She has intermittent discomfort.  History of repair of right rotator cuff  History of colon cancer  History of gastroesophageal reflux (GERD)   Orders: Orders Placed This Encounter  Procedures  . CBC with Differential/Platelet  . COMPLETE METABOLIC PANEL WITH GFR   No orders of the defined types were placed in this encounter.   Face-to-face time spent with patient was 30 minutes. Greater than 50% of time was spent in counseling and coordination of care.  Follow-Up Instructions: Return in about 5 months (around 09/20/2018) for Rheumatoid arthritis, Scleroderma.   Bo Merino, MD  Note - This record has been created using Editor, commissioning.  Chart creation errors have been sought, but may not always  have been located. Such creation errors do not reflect on  the standard of medical care.

## 2018-04-15 ENCOUNTER — Other Ambulatory Visit: Payer: Self-pay | Admitting: Physician Assistant

## 2018-04-15 NOTE — Telephone Encounter (Signed)
Last Visit: 01/18/18 Next Visit:  04/20/18 Labs: 02/05/18 Glucose is 123. Creatinine is borderline elevated and GFR is WNL. CBC WNL  Okay to refill per Dr. Estanislado Pandy

## 2018-04-20 ENCOUNTER — Ambulatory Visit: Payer: Medicare Other | Admitting: Rheumatology

## 2018-04-20 ENCOUNTER — Encounter: Payer: Self-pay | Admitting: Physician Assistant

## 2018-04-20 VITALS — BP 114/71 | HR 86 | Resp 14 | Ht 66.75 in | Wt 165.2 lb

## 2018-04-20 DIAGNOSIS — Z79899 Other long term (current) drug therapy: Secondary | ICD-10-CM

## 2018-04-20 DIAGNOSIS — M19041 Primary osteoarthritis, right hand: Secondary | ICD-10-CM

## 2018-04-20 DIAGNOSIS — E8889 Other specified metabolic disorders: Secondary | ICD-10-CM

## 2018-04-20 DIAGNOSIS — M170 Bilateral primary osteoarthritis of knee: Secondary | ICD-10-CM

## 2018-04-20 DIAGNOSIS — Z9889 Other specified postprocedural states: Secondary | ICD-10-CM

## 2018-04-20 DIAGNOSIS — I7300 Raynaud's syndrome without gangrene: Secondary | ICD-10-CM

## 2018-04-20 DIAGNOSIS — M349 Systemic sclerosis, unspecified: Secondary | ICD-10-CM

## 2018-04-20 DIAGNOSIS — J849 Interstitial pulmonary disease, unspecified: Secondary | ICD-10-CM | POA: Diagnosis not present

## 2018-04-20 DIAGNOSIS — R682 Dry mouth, unspecified: Secondary | ICD-10-CM

## 2018-04-20 DIAGNOSIS — M19042 Primary osteoarthritis, left hand: Secondary | ICD-10-CM

## 2018-04-20 DIAGNOSIS — M0600 Rheumatoid arthritis without rheumatoid factor, unspecified site: Secondary | ICD-10-CM

## 2018-04-20 DIAGNOSIS — Z85038 Personal history of other malignant neoplasm of large intestine: Secondary | ICD-10-CM

## 2018-04-20 DIAGNOSIS — Z8719 Personal history of other diseases of the digestive system: Secondary | ICD-10-CM

## 2018-04-20 LAB — COMPLETE METABOLIC PANEL WITH GFR
AG Ratio: 1.8 (calc) (ref 1.0–2.5)
ALBUMIN MSPROF: 3.9 g/dL (ref 3.6–5.1)
ALT: 9 U/L (ref 6–29)
AST: 15 U/L (ref 10–35)
Alkaline phosphatase (APISO): 64 U/L (ref 37–153)
BUN: 11 mg/dL (ref 7–25)
CALCIUM: 9.4 mg/dL (ref 8.6–10.4)
CO2: 29 mmol/L (ref 20–32)
Chloride: 103 mmol/L (ref 98–110)
Creat: 0.76 mg/dL (ref 0.60–0.93)
GFR, EST AFRICAN AMERICAN: 90 mL/min/{1.73_m2} (ref >=60)
GFR, EST NON AFRICAN AMERICAN: 78 mL/min/{1.73_m2} (ref >=60)
Globulin: 2.2 g/dL (calc) (ref 1.9–3.7)
Glucose, Bld: 87 mg/dL (ref 65–99)
Potassium: 3.9 mmol/L (ref 3.5–5.3)
Sodium: 139 mmol/L (ref 135–146)
TOTAL PROTEIN: 6.1 g/dL (ref 6.1–8.1)
Total Bilirubin: 0.2 mg/dL (ref 0.2–1.2)

## 2018-04-20 LAB — CBC WITH DIFFERENTIAL/PLATELET
ABSOLUTE MONOCYTES: 706 {cells}/uL (ref 200–950)
BASOS PCT: 0.5 %
Basophils Absolute: 28 cells/uL (ref 0–200)
EOS ABS: 101 {cells}/uL (ref 15–500)
Eosinophils Relative: 1.8 %
HCT: 40.3 % (ref 35.0–45.0)
HEMOGLOBIN: 13.3 g/dL (ref 11.7–15.5)
Lymphs Abs: 896 cells/uL (ref 850–3900)
MCH: 31.1 pg (ref 27.0–33.0)
MCHC: 33 g/dL (ref 32.0–36.0)
MCV: 94.4 fL (ref 80.0–100.0)
MPV: 10.4 fL (ref 7.5–12.5)
Monocytes Relative: 12.6 %
NEUTROS ABS: 3870 {cells}/uL (ref 1500–7800)
Neutrophils Relative %: 69.1 %
PLATELETS: 315 10*3/uL (ref 140–400)
RBC: 4.27 10*6/uL (ref 3.80–5.10)
RDW: 14.2 % (ref 11.0–15.0)
TOTAL LYMPHOCYTE: 16 %
WBC: 5.6 10*3/uL (ref 3.8–10.8)

## 2018-04-20 MED ORDER — AZATHIOPRINE 50 MG PO TABS: 75.0000 mg | ORAL_TABLET | Freq: Every day | ORAL | 0 refills | Status: DC

## 2018-04-20 NOTE — Patient Instructions (Signed)
Standing Labs We placed an order today for your standing lab work.    Please come back and get your standing labs in June and every 3 months   We have open lab Monday through Friday from 8:30-11:30 AM and 1:30-4:00 PM  at the office of Dr. Ragen Laver.   You may experience shorter wait times on Monday and Friday afternoons. The office is located at [address], [address], [place], [place] [address] No appointment is necessary.   Labs are drawn by [facility].  You may receive a bill from [facility] for your lab work.  If you wish to have your labs drawn at another location, please call the office 24 hours in advance to send orders.  If you have any questions regarding directions or hours of operation,  please call [phone].   Just as a reminder please drink plenty of water prior to coming for your lab work. Thanks!   

## 2018-04-20 NOTE — Addendum Note (Signed)
Addended by: Mariella Saa C on: 04/20/2018 03:07 PM   Modules accepted: Orders

## 2018-04-21 ENCOUNTER — Encounter: Payer: Self-pay | Admitting: Internal Medicine

## 2018-04-21 ENCOUNTER — Telehealth: Payer: Self-pay | Admitting: *Deleted

## 2018-04-21 NOTE — Telephone Encounter (Signed)
She should take Tylenol for pain.  If her pain persist she should see PCP for evaluation.

## 2018-04-21 NOTE — Telephone Encounter (Signed)
Patient advised she should take Tylenol for pain.  If her pain persist she should see PCP for evaluation. Patient verbalized understanding.

## 2018-04-21 NOTE — Telephone Encounter (Signed)
Patient states she is having an ache in her back. Patient states it is on the left hand side around the waist area down about 5-6 inches. Patient would like to know what she might be able to take for the pain as she can not take NSAIDS. Please advise.

## 2018-04-27 ENCOUNTER — Other Ambulatory Visit: Payer: Self-pay | Admitting: Rheumatology

## 2018-04-28 ENCOUNTER — Telehealth: Payer: Self-pay | Admitting: Internal Medicine

## 2018-04-28 MED ORDER — AZATHIOPRINE 50 MG PO TABS: 75.0000 mg | ORAL_TABLET | Freq: Every day | ORAL | 2 refills | Status: DC

## 2018-04-28 NOTE — Telephone Encounter (Signed)
Last Visit: 04/20/18 Next Visit: 09/22/18 Labs: 04/20/18 WNL  Okay to refill per Dr. Estanislado Pandy

## 2018-04-28 NOTE — Telephone Encounter (Signed)
Called pt back to cancel upcoming appt per Dr. Harrington Challenger d/t [place]  Pt agreeable and thankful for the cancellation Informed pt she would receive a call in near future for rescheduling  Pt denies any symptoms at this time and has been advised to call back if any new symptoms present. Pt verbalized understanding.

## 2018-04-28 NOTE — Telephone Encounter (Signed)
Spoke with husband   Pt out Referred by Dr Estanislado Pandy With corona virus outbreak concerned about spreading of infeciton   Unless having active symptoms would recomm rescheduling until end of April when infection status improved  Will have nurse call back to review once pt is home

## 2018-04-28 NOTE — Telephone Encounter (Signed)
New Message ° ° ° °Pt is returning call  ° ° ° °Please call back  °

## 2018-05-03 ENCOUNTER — Ambulatory Visit: Payer: Medicare Other | Admitting: Internal Medicine

## 2018-05-28 ENCOUNTER — Telehealth: Payer: Self-pay

## 2018-05-28 NOTE — Telephone Encounter (Signed)
Called pt to reschedule her appt. Pt agreeable to VIDEO visit.      Virtual Visit Pre-Appointment Phone Call  Steps For Call:  1. Confirm consent - "In the setting of the current Covid19 crisis, you are scheduled for a VIDEOvisit with your provider on 4/23 at 10am.  Just as we do with many in-office visits, in order for you to participate in this visit, we must obtain consent.  If you'd like, I can send this to your mychart (if signed up) or email for you to review.  Otherwise, I can obtain your verbal consent now.  All virtual visits are billed to your insurance company just like a normal visit would be.  By agreeing to a virtual visit, we'd like you to understand that the technology does not allow for your provider to perform an examination, and thus may limit your provider's ability to fully assess your condition.  Finally, though the technology is pretty good, we cannot assure that it will always work on either your or our end, and in the setting of a video visit, we may have to convert it to a phone-only visit.  In either situation, we cannot ensure that we have a secure connection.  Are you willing to proceed?" STAFF: Did the patient verbally acknowledge consent to telehealth visit? Document YES/NO here: YES  2. Confirm the BEST phone number to call the day of the visit by including in appointment notes  3. Give patient instructions for WebEx/MyChart download to smartphone as below or [email or web address] if video visit (depending on what platform provider is using)  4. Advise patient to be prepared with their blood pressure, heart rate, weight, any heart rhythm information, their current medicines, and a piece of paper and pen handy for any instructions they may receive the day of their visit  5. Inform patient they will receive a phone call 15 minutes prior to their appointment time (may be from unknown caller ID) so they should be prepared to answer  6. Confirm that appointment type is  correct in Epic appointment notes (VIDEO vs PHONE)     TELEPHONE CALL NOTE  Lindsay Herman has been deemed a candidate for a follow-up tele-health visit to limit community exposure during the Covid-19 pandemic. I spoke with the patient via phone to ensure availability of phone/video source, confirm preferred email & phone number, and discuss instructions and expectations.  I reminded Lindsay Herman to be prepared with any vital sign and/or heart rhythm information that could potentially be obtained via home monitoring, at the time of her visit. I reminded Lindsay Herman to expect a phone call at the time of her visit if her visit.  Wilma Flavin, RN 05/28/2018 9:40 AM  .

## 2018-06-03 ENCOUNTER — Telehealth (INDEPENDENT_AMBULATORY_CARE_PROVIDER_SITE_OTHER): Payer: Medicare Other | Admitting: Internal Medicine

## 2018-06-03 ENCOUNTER — Other Ambulatory Visit: Payer: Self-pay

## 2018-06-03 ENCOUNTER — Telehealth: Payer: Self-pay | Admitting: Internal Medicine

## 2018-06-03 ENCOUNTER — Encounter: Payer: Self-pay | Admitting: Internal Medicine

## 2018-06-03 VITALS — HR 79 | Ht 66.75 in | Wt 163.0 lb

## 2018-06-03 DIAGNOSIS — E782 Mixed hyperlipidemia: Secondary | ICD-10-CM

## 2018-06-03 DIAGNOSIS — M349 Systemic sclerosis, unspecified: Secondary | ICD-10-CM | POA: Diagnosis not present

## 2018-06-03 NOTE — Telephone Encounter (Signed)
Ne message   Patient is returning call about virtual visit that is scheduled at 10:00am with Dr. Harrington Challenger. Please call.

## 2018-06-03 NOTE — Progress Notes (Signed)
Virtual Visit via Video Note   This visit type was conducted due to national recommendations for restrictions regarding the COVID-19 Pandemic (e.g. social distancing) in an effort to limit this patient's exposure and mitigate transmission in our community.  Due to her co-morbid illnesses, this patient is at least at moderate risk for complications without adequate follow up.  This format is felt to be most appropriate for this patient at this time.  All issues noted in this document were discussed and addressed.  A limited physical exam was performed with this format.  Please refer to the patient's chart for her consent to telehealth for [facility].   Evaluation Performed:  Follow-up visit  Date:  06/03/2018   ID:  Lindsay Herman, DOB 02/21/1945, MRN [id number]  Patient Location: Home Provider Location: Home  PCP:  Burnard Bunting, MD  Cardiologist:  Previously D Bensimhon   Rheum  Deveshwar Pulm:  Ramaswamy   Chief Complaint:  F/U of SOB  Also coronary calcifications    History of Present Illness:    Lindsay Herman is a 73 y.o. female with hx of seronegative RA, GERD scleroderma  She was seen by D Bensimhon in 2018 for eval of PAH  Echo did not sugg PAH   PFTs showed DLCO was only minimally decreased    Recomm f/u in cardiology in 2 years   Since then she has continued f/u in rheum [employer])   She has also been seen in pulmonary (Ramaswamy)   High resolution CT showed probable UIP pattern ILD Form of scleroderma.   Felt mild   Rx Imuran   Plan for spirometry f/u and f/u in clinic    The patient says on CT scan last fall there was comments on atherosclerosis of aorta, great vessels and Coronary arteries  The pt denies CP   No SOB   No dizziness  No edema    The patient does not have symptoms concerning for COVID-19 infection (fever, chills, cough, or new shortness of breath).    Past Medical History:  Diagnosis Date   Atrophic vaginitis    Cancer ([place])    Colon cancer    Contact lens/glasses fitting    wears contacts or glasses   Cystocele    Osteopenia 06/2017   T score -2.0 FRAX 13% / 2.8%   PONV (postoperative nausea and vomiting)    Raynaud's disease    Rectocele    Rosacea    Scleroderma ([place])    Urinary frequency    Past Surgical History:  Procedure Laterality Date   APPENDECTOMY     colon cancer     COLON RESECTION  1995   MASS EXCISION Left 08/31/2012   Procedure: EXCISION MUCOID CYST DEBRIDEMENT DISTAL INTERPHALANGEAL LEFT MIDDLE FINGER;  Surgeon: Wynonia Sours, MD;  Location: [facility];  Service: Orthopedics;  Laterality: Left;   NOSE SURGERY     OOPHORECTOMY     LSO   ROTATOR CUFF REPAIR     TONSILLECTOMY     VAGINAL HYSTERECTOMY  1994     Current Meds  Medication Sig   azaTHIOprine (IMURAN) 50 MG tablet Take 1.5 tablets (75 mg total) by mouth daily.   Multiple Vitamins-Minerals (PRESERVISION AREDS 2) CAPS Take 1 capsule by mouth 2 (two) times daily.     Allergies:   Codeine and Morphine and related   Social History   Tobacco Use   Smoking status: Former Smoker    Years: 3.00  Types: Cigarettes    Last attempt to quit: 08/25/1972    Years since quitting: 45.8   Smokeless tobacco: Never Used  Substance Use Topics   Alcohol use: Yes    Comment: occ   Drug use: Never     Family Hx: The patient's family history includes Diabetes in her brother and mother; Heart disease in her mother; Hypertension in her brother; Lupus in her father; Stroke in her mother.  ROS:   Please see the history of present illness.    All other systems reviewed and are negative.   Prior CV studies:   The following studies were reviewed today: Echo 2018 CT scan 2019    Labs/Other Tests and Data Reviewed:    EKG:  Not done as televisit Recent Labs: 04/20/2018: ALT 9; BUN 11; Creat 0.76; Hemoglobin 13.3; Platelets 315; Potassium 3.9; Sodium 139   Recent Lipid Panel No results found for: CHOL,  TRIG, HDL, CHOLHDL, LDLCALC, LDLDIRECT  Wt Readings from Last 3 Encounters:  06/03/18 163 lb (73.9 kg)  04/20/18 165 lb 3.2 oz (74.9 kg)  03/05/18 164 lb (74.4 kg)     Objective:    Vital Signs:  Pulse 79    Ht 5' 6.75" (1.695 m)    Wt 163 lb (73.9 kg)    SpO2 98%    BMI 25.72 kg/m    Not done as tele visit   Pt did not take BP today     ASSESSMENT & PLAN:    1. CAD   Pt with CT evid of plaquing  I have reviewed images   Does not appear severe    I will review notes and labs from [place],   She had been on Crestor 1x per wk in past  Reluctant to go up with rheum issues    She is not taking now   [place] her numbers/ratio are good  Will review   No symptoms to sugg angina   Get back with her once I have reviewed data  2   Rheum.   Pt with RA and scleroderma with mild interstitial lung involvement    Echo in 2018  Was normal   No evid of pulmonary HTN    If PFTs remain the sam then I would continue to follow   Likelihood of development of signif pulm HTN without change in PFTs is extremely low       3   Lipids   As noted above   Will need to review outside records    4  COVID-19 Education: The signs and symptoms of COVID-19 were discussed with the patient and how to seek care for testing (follow up with PCP or arrange E-visit).  The importance of social distancing was discussed today.  Time:   Today, I have spent  25  minutes with the patient with telehealth technology discussing the above problems.     Medication Adjustments/Labs and Tests Ordered: Current medicines are reviewed at length with the patient today.  Concerns regarding medicines are outlined above.   Tests Ordered: No orders of the defined types were placed in this encounter.   Medication Changes: No orders of the defined types were placed in this encounter.   Disposition:  Follow up based on review of records  Signed, Dorris Carnes, MD  06/03/2018 10:02 AM    [place]

## 2018-06-03 NOTE — Patient Instructions (Addendum)
Medication Instructions:  No changes today If you need a refill on your cardiac medications before your next appointment, please call your pharmacy.   Lab work: None--we will get recent lab work from hour [facility] office.  If you have labs (blood work) drawn today and your tests are completely normal, you will receive your results only by: Marland Kitchen MyChart Message (if you have MyChart) OR . A paper copy in the mail If you have any lab test that is abnormal or we need to change your treatment, we will call you to review the results.  Testing/Procedures: none  . Follow-Up: . We will call you after Dr. Harrington Challenger reviews blood work results from Dr. Jacquiline Doe office (Lipids)  Any Other Special Instructions Will Be Listed Below (If Applicable).   Addendum: called Dr. Jacquiline Doe office-medical records. Requested last office visit note and recent labs (lipids) be faxed to Dr. Harrington Challenger at [phone].

## 2018-06-03 NOTE — Telephone Encounter (Signed)
Virtual visit completed.

## 2018-06-08 ENCOUNTER — Ambulatory Visit: Payer: Medicare Other | Admitting: Internal Medicine

## 2018-06-14 ENCOUNTER — Telehealth: Payer: Self-pay | Admitting: Internal Medicine

## 2018-06-14 DIAGNOSIS — E782 Mixed hyperlipidemia: Secondary | ICD-10-CM

## 2018-06-14 NOTE — Telephone Encounter (Signed)
Review of outside labs LDL was 128    Would recomm trial of statin to lower    Crestor  Start at 5 mg daily    F/U lipdis and AST and CK in 8 wks

## 2018-06-16 ENCOUNTER — Telehealth: Payer: Self-pay | Admitting: Internal Medicine

## 2018-06-16 MED ORDER — ROSUVASTATIN CALCIUM 5 MG PO TABS: 5.0000 mg | ORAL_TABLET | Freq: Every day | ORAL | 3 refills | Status: DC

## 2018-06-16 NOTE — Telephone Encounter (Signed)
Left message to call back  

## 2018-06-16 NOTE — Telephone Encounter (Signed)
New Message ° °Patient returning your call please call back. °

## 2018-06-16 NOTE — Telephone Encounter (Signed)
See previous phone encounter.

## 2018-06-16 NOTE — Addendum Note (Signed)
Addended by: Rodman Key on: 06/16/2018 11:24 AM   Modules accepted: Orders

## 2018-06-16 NOTE — Telephone Encounter (Signed)
Spoke with patient. She explained that she had tried Crestor 20 mg once a week as recommended by PCP but stopped it because she didn't want it to interfere with any testing Dr. Harrington Challenger would be doing.  It is unclear whether she was taking it when July labs were drawn.   She reports tolerating it.  Will start Crestor 5 mg daily and come for labs on 08/12/18.  Will come around 7:30 am. Aware to call if notes any adverse reaction to medication.

## 2018-06-30 ENCOUNTER — Encounter: Payer: Self-pay | Admitting: Rheumatology

## 2018-07-20 ENCOUNTER — Other Ambulatory Visit: Payer: Self-pay

## 2018-07-20 DIAGNOSIS — Z79899 Other long term (current) drug therapy: Secondary | ICD-10-CM

## 2018-07-21 LAB — COMPLETE METABOLIC PANEL WITH GFR
AG Ratio: 1.8 (calc) (ref 1.0–2.5)
ALT: 8 U/L (ref 6–29)
AST: 15 U/L (ref 10–35)
Albumin: 4.2 g/dL (ref 3.6–5.1)
Alkaline phosphatase (APISO): 59 U/L (ref 37–153)
BUN: 13 mg/dL (ref 7–25)
CO2: 26 mmol/L (ref 20–32)
Calcium: 9.8 mg/dL (ref 8.6–10.4)
Chloride: 106 mmol/L (ref 98–110)
Creat: 0.84 mg/dL (ref 0.60–0.93)
GFR, Est African American: 80 mL/min/{1.73_m2} (ref >=60)
GFR, Est Non African American: 69 mL/min/{1.73_m2} (ref >=60)
Globulin: 2.3 g/dL (calc) (ref 1.9–3.7)
Glucose, Bld: 83 mg/dL (ref 65–99)
Potassium: 4.7 mmol/L (ref 3.5–5.3)
Sodium: 140 mmol/L (ref 135–146)
Total Bilirubin: 0.3 mg/dL (ref 0.2–1.2)
Total Protein: 6.5 g/dL (ref 6.1–8.1)

## 2018-07-21 LAB — CBC WITH DIFFERENTIAL/PLATELET
Absolute Monocytes: 620 cells/uL (ref 200–950)
Basophils Absolute: 20 cells/uL (ref 0–200)
Basophils Relative: 0.4 %
Eosinophils Absolute: 60 cells/uL (ref 15–500)
Eosinophils Relative: 1.2 %
HCT: 39.3 % (ref 35.0–45.0)
Hemoglobin: 13.2 g/dL (ref 11.7–15.5)
Lymphs Abs: 1005 cells/uL (ref 850–3900)
MCH: 32.5 pg (ref 27.0–33.0)
MCHC: 33.6 g/dL (ref 32.0–36.0)
MCV: 96.8 fL (ref 80.0–100.0)
MPV: 10.9 fL (ref 7.5–12.5)
Monocytes Relative: 12.4 %
Neutro Abs: 3295 cells/uL (ref 1500–7800)
Neutrophils Relative %: 65.9 %
Platelets: 318 10*3/uL (ref 140–400)
RBC: 4.06 10*6/uL (ref 3.80–5.10)
RDW: 13.1 % (ref 11.0–15.0)
Total Lymphocyte: 20.1 %
WBC: 5 10*3/uL (ref 3.8–10.8)

## 2018-07-21 NOTE — Progress Notes (Signed)
CMP WNL

## 2018-07-21 NOTE — Progress Notes (Signed)
CBC WNL

## 2018-08-12 ENCOUNTER — Other Ambulatory Visit: Payer: Medicare Other | Admitting: *Deleted

## 2018-08-12 ENCOUNTER — Other Ambulatory Visit: Payer: Self-pay

## 2018-08-12 ENCOUNTER — Encounter: Payer: Self-pay | Admitting: Internal Medicine

## 2018-08-12 LAB — LIPID PANEL
Chol/HDL Ratio: 3 ratio (ref 0.0–4.4)
Cholesterol, Total: 163 mg/dL (ref 100–199)
HDL: 55 mg/dL (ref >39)
LDL Calculated: 91 mg/dL (ref 0–99)
Triglycerides: 85 mg/dL (ref 0–149)
VLDL Cholesterol Cal: 17 mg/dL (ref 5–40)

## 2018-08-12 LAB — AST: AST: 16 IU/L (ref 0–40)

## 2018-08-12 LAB — CK: Total CK: 53 U/L (ref 32–182)

## 2018-08-16 ENCOUNTER — Telehealth: Payer: Self-pay

## 2018-08-16 DIAGNOSIS — Z79899 Other long term (current) drug therapy: Secondary | ICD-10-CM

## 2018-08-16 DIAGNOSIS — E782 Mixed hyperlipidemia: Secondary | ICD-10-CM

## 2018-08-16 MED ORDER — ROSUVASTATIN CALCIUM 10 MG PO TABS: 10.0000 mg | ORAL_TABLET | Freq: Every day | ORAL | 3 refills | Status: DC

## 2018-08-16 NOTE — Telephone Encounter (Signed)
-----   Message from Dorris Carnes V, MD sent at 08/16/2018  9:26 AM EDT ----- Pt on 5 Crestor   LDL 91    CT scan shows plaquing on the coronary arteries.  Goal for LDL would be less than 70   I would increase Crestor to 10 mg  CHeck lipids in 10 wks

## 2018-08-16 NOTE — Addendum Note (Signed)
Addended by: Stephani Police on: 08/16/2018 04:24 PM   Modules accepted: Orders

## 2018-08-16 NOTE — Telephone Encounter (Addendum)
Pt advised lab results and will have repeat fasting labs on 10/19/18... will send in new RX for profile.

## 2018-09-08 NOTE — Progress Notes (Signed)
Office Visit Note  Patient: Lindsay Herman             Date of Birth: February 25, 1945           MRN: [id number]             PCP: Burnard Bunting, MD Referring: Burnard Bunting, MD Visit Date: 09/22/2018 Occupation: @GUAROCC @  Subjective:  Other (left knee pain )   History of Present Illness: Lindsay Herman is a 73 y.o. female with history of rheumatoid arthritis, scleroderma, sicca symptoms and osteoarthritis.  She states she has been having pain and discomfort in her left knee joint for about 3 weeks.  She has been using a brace and also taking Aleve on PRN basis.  None of the other joints are painful.  She has been taking Imuran 75 mg a day.  She states that her appointment with Dr. Chase Caller was canceled in April.  She still have to reschedule it.  She denies any shortness of breath.  She denies any active Raynaud's at this time.  She continues to have some sicca symptoms with dry eyes.  Activities of Daily Living:  Patient reports morning stiffness for 5 minutes.   Patient Denies nocturnal pain.  Difficulty dressing/grooming: Denies Difficulty climbing stairs: Denies Difficulty getting out of chair: Denies Difficulty using hands for taps, buttons, cutlery, and/or writing: Denies  Review of Systems  Constitutional: Positive for fatigue. Negative for night sweats, weight gain and weight loss.  HENT: Negative for mouth sores, trouble swallowing, trouble swallowing, mouth dryness and nose dryness.   Eyes: Positive for dryness. Negative for pain, redness, itching and visual disturbance.  Respiratory: Negative for cough, shortness of breath, wheezing and difficulty breathing.   Cardiovascular: Negative for chest pain, palpitations, hypertension, irregular heartbeat and swelling in legs/feet.  Gastrointestinal: Negative for abdominal pain, blood in stool, constipation and diarrhea.  Endocrine: Negative for increased urination.  Genitourinary: Negative for painful urination and vaginal  dryness.  Musculoskeletal: Positive for arthralgias, joint pain, joint swelling and morning stiffness. Negative for myalgias, muscle weakness, muscle tenderness and myalgias.  Skin: Positive for skin tightness. Negative for color change, rash, hair loss, redness, ulcers and sensitivity to sunlight.  Allergic/Immunologic: Negative for susceptible to infections.  Neurological: Negative for dizziness, headaches, memory loss, night sweats and weakness.  Hematological: Negative for bruising/bleeding tendency and swollen glands.  Psychiatric/Behavioral: Negative for depressed mood, confusion and sleep disturbance. The patient is not nervous/anxious.     PMFS History:  Patient Active Problem List   Diagnosis Date Noted  . Scleroderma, limited ([place]) 02/10/2016  . History of environmental allergies 02/10/2016  . Gastroesophageal reflux disease 02/10/2016  . History of colon cancer 02/10/2016  . Sicca syndrome 02/01/2016  . History of repair of right rotator cuff 02/01/2016  . Primary osteoarthritis of both knees 02/01/2016  . Primary osteoarthritis of both hands 02/01/2016  . Raynaud's disease without gangrene 02/01/2016  . Cystocele   . Atrophic vaginitis   . Osteopenia   . Menopausal symptoms     Past Medical History:  Diagnosis Date  . Atrophic vaginitis   . Cancer [facility])    Colon cancer  . Contact lens/glasses fitting    wears contacts or glasses  . Cystocele   . Osteopenia 06/2017   T score -2.0 FRAX 13% / 2.8%  . PONV (postoperative nausea and vomiting)   . Raynaud's disease   . Rectocele   . Rosacea   . Scleroderma ([place])   . Urinary frequency  Family History  Problem Relation Age of Onset  . Diabetes Mother   . Heart disease Mother   . Stroke Mother   . Diabetes Brother   . Hypertension Brother   . Lupus Father    Past Surgical History:  Procedure Laterality Date  . APPENDECTOMY    . colon cancer    . COLON RESECTION  1995  . MASS EXCISION Left 08/31/2012    Procedure: EXCISION MUCOID CYST DEBRIDEMENT DISTAL INTERPHALANGEAL LEFT MIDDLE FINGER;  Surgeon: Wynonia Sours, MD;  Location: [facility];  Service: Orthopedics;  Laterality: Left;  . NOSE SURGERY    . OOPHORECTOMY     LSO  . ROTATOR CUFF REPAIR    . TONSILLECTOMY    . VAGINAL HYSTERECTOMY  1994   Social History   Social History Narrative  . Not on file   Immunization History  Administered Date(s) Administered  . Influenza Split 12/12/2010  . Influenza, High Dose Seasonal PF 10/24/2017  . Pneumococcal Conjugate-13 07/24/2014  . Pneumococcal Polysaccharide-23 01/31/2009     Objective: Vital Signs: BP 126/78 (BP Location: Left Arm, Patient Position: Sitting, Cuff Size: Normal)   Pulse 77   Resp 13   Ht 5\' 7"  (1.702 m)   Wt 169 lb (76.7 kg)   BMI 26.47 kg/m    Physical Exam Vitals signs and nursing note reviewed.  Constitutional:      Appearance: She is well-developed.  HENT:     Head: Normocephalic and atraumatic.  Eyes:     Conjunctiva/sclera: Conjunctivae normal.  Neck:     Musculoskeletal: Normal range of motion.  Cardiovascular:     Rate and Rhythm: Normal rate and regular rhythm.     Heart sounds: Normal heart sounds.  Pulmonary:     Effort: Pulmonary effort is normal.     Breath sounds: Rales present.  Abdominal:     General: Bowel sounds are normal.     Palpations: Abdomen is soft.  Lymphadenopathy:     Cervical: No cervical adenopathy.  Skin:    General: Skin is warm and dry.     Capillary Refill: Capillary refill takes less than 2 seconds.     Comments: mild sclerodactyly  Neurological:     Mental Status: She is alert and oriented to person, place, and time.  Psychiatric:        Behavior: Behavior normal.      Musculoskeletal Exam: C-spine thoracic and lumbar spine were in good range of motion.  Shoulder joints elbow joints were in good range of motion.  She is synovial thickening of the right wrist joint with limited range of motion.   Hip joints with good range of motion.  She has discomfort range of motion of her left knee joint without any warmth swelling or effusion.  She has DIP and PIP thickening in her hands and feet consistent with osteoarthritis.  CDAI Exam: CDAI Score: 0.2  Patient Global: 1 mm; Provider Global: 1 mm Swollen: 0 ; Tender: 0  Joint Exam   No joint exam has been documented for this visit   There is currently no information documented on the homunculus. Go to the Rheumatology activity and complete the homunculus joint exam.  Investigation: No additional findings.  Imaging: No results found.  Recent Labs: Lab Results  Component Value Date   WBC 5.0 07/20/2018   HGB 13.2 07/20/2018   PLT 318 07/20/2018   NA 140 07/20/2018   K 4.7 07/20/2018   CL 106 07/20/2018  CO2 26 07/20/2018   GLUCOSE 83 07/20/2018   BUN 13 07/20/2018   CREATININE 0.84 07/20/2018   BILITOT 0.3 07/20/2018   ALKPHOS 61 02/12/2016   AST 16 08/12/2018   ALT 8 07/20/2018   PROT 6.5 07/20/2018   ALBUMIN 4.1 02/12/2016   CALCIUM 9.8 07/20/2018   GFRAA 80 07/20/2018   QFTBGOLDPLUS NEGATIVE 10/06/2017    Speciality Comments: Osteoporosis managed by Dr. Phineas Real.  Procedures:  No procedures performed Allergies: Codeine and Morphine and related   Assessment / Plan:     Visit Diagnoses: Seronegative rheumatoid arthritis ([place]) - RF-, CCP-, 14-3-3 eta negative, R wrist synovitis, erosive changes, intercarpal and radiocarpal joint space narrowing -patient has no active synovitis on examination today.  She is synovial thickening in her right wrist.  Her symptoms are better controlled with Imuran.  High risk medication use - Imuran 75 mg po daily.  Her labs were normal in June.  We will check labs again in September and then every 3 months to monitor for drug toxicity.  Scleroderma, limited ([place]) -mild sclerodactyly is noted.  Interstitial lung disease ([place]) - CT ILD stable-10/19/17, PFT 11/10/17, Echo 04/16/17, Dr.  Ramaswamy q39months.  Reports that her appointment was canceled in April.  Have advised her to schedule a follow-up appointment with Dr. Chase Caller.  TPMT intermediate metabolizer ([facility]) -labs are stable.  We will monitor closely.  Raynaud's disease without gangrene -Raynauds is currently not active.  Sicca syndrome-she continues to have dry eyes.  Over-the-counter products were discussed.  Primary osteoarthritis of both hands -joint protection was discussed.  Primary osteoarthritis of both knees -she has been experiencing increased pain in her left knee.  She has been using a brace and taking NSAIDs on PRN basis.  I offered cortisone injection but she declined.  History of repair of right rotator cuff -it was in good range of motion.  History of gastroesophageal reflux (GERD) -currently not very symptomatic.  History of colon cancer -according to patient she had cancer more than 10 years ago.  Orders: No orders of the defined types were placed in this encounter.  No orders of the defined types were placed in this encounter.   Face-to-face time spent with patient was 30 minutes. Greater than 50% of time was spent in counseling and coordination of care.  Follow-Up Instructions: Return in about 5 months (around 02/22/2019) for ILD, Scleroderma, Rheumatoid arthritis.   Bo Merino, MD  Note - This record has been created using Editor, commissioning.  Chart creation errors have been sought, but may not always  have been located. Such creation errors do not reflect on  the standard of medical care.

## 2018-09-13 ENCOUNTER — Ambulatory Visit: Payer: Medicare Other | Admitting: Internal Medicine

## 2018-09-22 ENCOUNTER — Other Ambulatory Visit: Payer: Self-pay

## 2018-09-22 ENCOUNTER — Ambulatory Visit (INDEPENDENT_AMBULATORY_CARE_PROVIDER_SITE_OTHER): Payer: Medicare Other | Admitting: Rheumatology

## 2018-09-22 ENCOUNTER — Encounter: Payer: Self-pay | Admitting: Rheumatology

## 2018-09-22 VITALS — BP 126/78 | HR 77 | Resp 13 | Ht 67.0 in | Wt 169.0 lb

## 2018-09-22 DIAGNOSIS — M170 Bilateral primary osteoarthritis of knee: Secondary | ICD-10-CM

## 2018-09-22 DIAGNOSIS — J849 Interstitial pulmonary disease, unspecified: Secondary | ICD-10-CM

## 2018-09-22 DIAGNOSIS — M19042 Primary osteoarthritis, left hand: Secondary | ICD-10-CM

## 2018-09-22 DIAGNOSIS — M349 Systemic sclerosis, unspecified: Secondary | ICD-10-CM

## 2018-09-22 DIAGNOSIS — E8889 Other specified metabolic disorders: Secondary | ICD-10-CM

## 2018-09-22 DIAGNOSIS — Z8719 Personal history of other diseases of the digestive system: Secondary | ICD-10-CM

## 2018-09-22 DIAGNOSIS — M19041 Primary osteoarthritis, right hand: Secondary | ICD-10-CM

## 2018-09-22 DIAGNOSIS — Z85038 Personal history of other malignant neoplasm of large intestine: Secondary | ICD-10-CM

## 2018-09-22 DIAGNOSIS — Z9889 Other specified postprocedural states: Secondary | ICD-10-CM

## 2018-09-22 DIAGNOSIS — M0600 Rheumatoid arthritis without rheumatoid factor, unspecified site: Secondary | ICD-10-CM

## 2018-09-22 DIAGNOSIS — Z79899 Other long term (current) drug therapy: Secondary | ICD-10-CM

## 2018-09-22 DIAGNOSIS — I7300 Raynaud's syndrome without gangrene: Secondary | ICD-10-CM

## 2018-09-22 DIAGNOSIS — R682 Dry mouth, unspecified: Secondary | ICD-10-CM

## 2018-09-22 NOTE — Patient Instructions (Signed)
Standing Labs We placed an order today for your standing lab work.    Please come back and get your standing labs in September and every 3 months  We have open lab daily Monday through Thursday from 8:30-12:30 PM and 1:30-4:30 PM and Friday from 8:30-12:30 PM and 1:30 -4:00 PM at the office of Dr. Naoma Boxell.   You may experience shorter wait times on Monday and Friday afternoons. The office is located at [address], [address], [place], [place] [address] No appointment is necessary.   Labs are drawn by [facility].  You may receive a bill from [facility] for your lab work.  If you wish to have your labs drawn at another location, please call the office 24 hours in advance to send orders.  If you have any questions regarding directions or hours of operation,  please call [phone].   Just as a reminder please drink plenty of water prior to coming for your lab work. Thanks!  

## 2018-10-13 ENCOUNTER — Other Ambulatory Visit: Payer: Self-pay | Admitting: Rheumatology

## 2018-10-13 NOTE — Telephone Encounter (Signed)
Last Visit: 09/22/18 Next Visit: 02/22/18 Labs: 07/20/18 WNL  Okay to refill per Dr. Estanislado Pandy

## 2018-10-14 ENCOUNTER — Other Ambulatory Visit: Payer: Self-pay

## 2018-10-14 DIAGNOSIS — Z79899 Other long term (current) drug therapy: Secondary | ICD-10-CM

## 2018-10-14 LAB — COMPLETE METABOLIC PANEL WITH GFR
AG Ratio: 1.8 (calc) (ref 1.0–2.5)
ALT: 9 U/L (ref 6–29)
AST: 14 U/L (ref 10–35)
Albumin: 4 g/dL (ref 3.6–5.1)
Alkaline phosphatase (APISO): 57 U/L (ref 37–153)
BUN: 16 mg/dL (ref 7–25)
CO2: 28 mmol/L (ref 20–32)
Calcium: 9.9 mg/dL (ref 8.6–10.4)
Chloride: 105 mmol/L (ref 98–110)
Creat: 0.91 mg/dL (ref 0.60–0.93)
GFR, Est African American: 73 mL/min/{1.73_m2} (ref >=60)
GFR, Est Non African American: 63 mL/min/{1.73_m2} (ref >=60)
Globulin: 2.2 g/dL (calc) (ref 1.9–3.7)
Glucose, Bld: 106 mg/dL — ABNORMAL HIGH (ref 65–99)
Potassium: 4.7 mmol/L (ref 3.5–5.3)
Sodium: 141 mmol/L (ref 135–146)
Total Bilirubin: 0.4 mg/dL (ref 0.2–1.2)
Total Protein: 6.2 g/dL (ref 6.1–8.1)

## 2018-10-14 LAB — CBC WITH DIFFERENTIAL/PLATELET
Absolute Monocytes: 803 cells/uL (ref 200–950)
Basophils Absolute: 30 cells/uL (ref 0–200)
Basophils Relative: 0.4 %
Eosinophils Absolute: 68 cells/uL (ref 15–500)
Eosinophils Relative: 0.9 %
HCT: 41 % (ref 35.0–45.0)
Hemoglobin: 13.5 g/dL (ref 11.7–15.5)
Lymphs Abs: 923 cells/uL (ref 850–3900)
MCH: 31.8 pg (ref 27.0–33.0)
MCHC: 32.9 g/dL (ref 32.0–36.0)
MCV: 96.7 fL (ref 80.0–100.0)
MPV: 10.7 fL (ref 7.5–12.5)
Monocytes Relative: 10.7 %
Neutro Abs: 5678 cells/uL (ref 1500–7800)
Neutrophils Relative %: 75.7 %
Platelets: 319 10*3/uL (ref 140–400)
RBC: 4.24 10*6/uL (ref 3.80–5.10)
RDW: 13.3 % (ref 11.0–15.0)
Total Lymphocyte: 12.3 %
WBC: 7.5 10*3/uL (ref 3.8–10.8)

## 2018-10-15 ENCOUNTER — Other Ambulatory Visit: Payer: Self-pay | Admitting: Internal Medicine

## 2018-10-15 NOTE — Progress Notes (Signed)
Glucose is 106. Rest of CMP WNL.  CBC WNL

## 2018-10-19 ENCOUNTER — Other Ambulatory Visit: Payer: Medicare Other

## 2018-10-19 ENCOUNTER — Other Ambulatory Visit: Payer: Self-pay

## 2018-10-19 DIAGNOSIS — E782 Mixed hyperlipidemia: Secondary | ICD-10-CM

## 2018-10-19 DIAGNOSIS — Z79899 Other long term (current) drug therapy: Secondary | ICD-10-CM

## 2018-10-19 LAB — HEPATIC FUNCTION PANEL
ALT: 7 IU/L (ref 0–32)
AST: 13 IU/L (ref 0–40)
Albumin: 4.3 g/dL (ref 3.7–4.7)
Alkaline Phosphatase: 64 IU/L (ref 39–117)
Bilirubin Total: 0.4 mg/dL (ref 0.0–1.2)
Bilirubin, Direct: 0.09 mg/dL (ref 0.00–0.40)
Total Protein: 6.6 g/dL (ref 6.0–8.5)

## 2018-10-19 LAB — LIPID PANEL
Chol/HDL Ratio: 2.8 ratio (ref 0.0–4.4)
Cholesterol, Total: 174 mg/dL (ref 100–199)
HDL: 62 mg/dL (ref >39)
LDL Chol Calc (NIH): 99 mg/dL (ref 0–99)
Triglycerides: 69 mg/dL (ref 0–149)
VLDL Cholesterol Cal: 13 mg/dL (ref 5–40)

## 2018-10-25 ENCOUNTER — Other Ambulatory Visit (HOSPITAL_COMMUNITY)
Admission: RE | Admit: 2018-10-25 | Discharge: 2018-10-25 | Disposition: A | Discharge status: Home / Self Care | Payer: Medicare Other | Source: Ambulatory Visit | Attending: Internal Medicine | Admitting: Internal Medicine

## 2018-10-25 ENCOUNTER — Other Ambulatory Visit: Payer: Self-pay | Admitting: *Deleted

## 2018-10-25 DIAGNOSIS — Z01812 Encounter for preprocedural laboratory examination: Secondary | ICD-10-CM | POA: Insufficient documentation

## 2018-10-25 DIAGNOSIS — Z20828 Contact with and (suspected) exposure to other viral communicable diseases: Secondary | ICD-10-CM | POA: Diagnosis not present

## 2018-10-25 LAB — SARS CORONAVIRUS 2 (TAT 6-24 HRS): SARS Coronavirus 2: NEGATIVE

## 2018-10-25 MED ORDER — ROSUVASTATIN CALCIUM 20 MG PO TABS: 20.0000 mg | ORAL_TABLET | Freq: Every day | ORAL | 3 refills | Status: DC

## 2018-10-27 ENCOUNTER — Ambulatory Visit (INDEPENDENT_AMBULATORY_CARE_PROVIDER_SITE_OTHER): Payer: Medicare Other | Admitting: Internal Medicine

## 2018-10-27 ENCOUNTER — Other Ambulatory Visit: Payer: Self-pay

## 2018-10-27 ENCOUNTER — Encounter: Payer: Self-pay | Admitting: Internal Medicine

## 2018-10-27 VITALS — BP 122/76 | HR 85 | Temp 98.2°F | Ht 67.0 in | Wt 165.0 lb

## 2018-10-27 DIAGNOSIS — M349 Systemic sclerosis, unspecified: Secondary | ICD-10-CM

## 2018-10-27 DIAGNOSIS — J849 Interstitial pulmonary disease, unspecified: Secondary | ICD-10-CM

## 2018-10-27 LAB — PULMONARY FUNCTION TEST
DL/VA % pred: 120 %
DL/VA: 4.84 ml/min/mmHg/L
DLCO unc % pred: 92 %
DLCO unc: 19.71 ml/min/mmHg
FEF 25-75 Pre: 4.13 L/sec
FEF2575-%Pred-Pre: 213 %
FEV1-%Pred-Pre: 117 %
FEV1-Pre: 2.89 L
FEV1FVC-%Pred-Pre: 116 %
FEV6-%Pred-Pre: 105 %
FEV6-Pre: 3.28 L
FEV6FVC-%Pred-Pre: 104 %
FVC-%Pred-Pre: 100 %
FVC-Pre: 3.28 L
Pre FEV1/FVC ratio: 88 %
Pre FEV6/FVC Ratio: 100 %

## 2018-10-27 NOTE — Addendum Note (Signed)
Addended by: Nena Polio on: 10/27/2018 10:23 AM   Modules accepted: Orders

## 2018-10-27 NOTE — Patient Instructions (Addendum)
ICD-10-CM   1. ILD (interstitial lung disease) ([place])  J84.9   2. Scleroderma ([place])  M34.9      You have interstitial lung disease secondary to scleroderma. The good news is that it is mild and is also stable between March 2018 ->  October 2019 -> Sept 2020 However as explained this can become unpredictably progressive especially over time   Plan -At this time I recommend you continue Imuran that was prescribed by Dr. Keturah Barre for your joint pain - I do not recommend adding anti-fibrotics at this time given stability of disease and GI side effects with this drug in context of prior colectomy - If you have intolerance to Imuran or this potential for progression then we can add nintedanib which is been in the market since October 2014 for another type of pulmonary fibrosis call IPF (nintedanib was recently approved for scleroderma interstitial lung disease]  -We will continue to monitor your situation with serial clinical visits lung function and flexible approach to CT scan --At this point in time you  did not need oxygen therapy -Continue to keep yourselff it -High-dose flu shot when possible this seasaon    Follow-up -In 9-12  months do spirometry and DLCO test - In 9-12 months do HRCT  -Return to ILD clinic with Dr Chase Caller after above in 9-12 months but after above tests

## 2018-10-27 NOTE — Progress Notes (Signed)
OV 11/10/2017  Background history followed by Dr. Estanislado Pandy - Scleroderma, limited ([place]) - Hx sclerodactyly, Raynauds, arthralgias, +ANA, +Ro, +La, +RF ; Off PLQ due to Macular Degenration Concern. - Raynaud's disease without gangrene-it is not very symptomatic currently. - Sicca syndrome-she continues to have dry mouth and dry eyes.  The symptoms are tolerable. - Primary osteoarthritis of both hands-she has some changes consistent with osteoarthritis. - Antibiodies - 10/06/17   - neg CCP and RA   Current history -73 year old lady with the above medical problems.  Known to have scleroderma for at least 10 years according to history.  She tells me that she has had insidious onset of shortness of breath for heavy exertion after doing long walks for a few to several years.  It is stable and has not changed.  She thought this was because of aging.  She does not have scleroderma.  She had pulmonary function test in the past but she does not know the result.  Looking at it it shows reduction in diffusion capacity.  Most recently based on the history and review of rheumatology charts it appears that she started having arthritis in her forearm.  This was in August 2019.  Rheumatoid arthritis work-up was negative on serology.  However because the arthritis was felt to be inflammatory methotrexate was considered.  Because of her previous abnormal pulmonary function test high-resolution CT chest was obtained and it showed probable UIP pattern ILD.  I personally visualized the CT chest and agree with those findings.  Repeat pulmonary function test showed stability.  Therefore she has been referred here.  In the phone conversation with Dr. Keturah Barre I was okay with patient starting Imuran against both arthritis and ILD.  At this point that she is feeling stable.  We asked her to do interstitial lung disease questionnaire.  We sent this to her to her house but she forgot to bring it.  Walking desaturation test was  normal without any desaturations.    [name] Comprehensive ILD Questionnaire  -Symptoms: She has level 3 dyspnea for walking up stairs or walking up a hill but otherwise does not have dyspnea.  She does not cough does not have a  Past medical history: She is occult positive for rheumatoid arthritis for a few weeks.  She she circled positive for scleroderma limited since 2010 but otherwise negative  Review of systems: Positive for fatigue at times for few years.  Arthralgia for several years.  She has had Raynud for several years.  She says after she retired and not having to rush to school and not being exposed to the outdoor air in the winter it is actually much better and not noticeable.  She does have some snoring but it is controlled with Afrin nasal spray.  Personal exposure history: Started smoking 1968 quit in 1974.  Smoked 8 cigarettes a day.  She has lived with a smoker but denies any use of cigars or marijuana cocaine intravenous drug use of vaping products.  Home exposure history: Single-family home suburban 33 years in the same home age of the home of 43 years  Occupational history: 122 questionnaires positive first living in a condition spaces but otherwise negative.  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 , MRN: [id number] , ADDRESS:  [place] [id number]   10/27/2018 -   Chief Complaint  Patient presents with  . ILD (interstitial lung disease)    Discuss results of PFT     ICD-10-CM   1. ILD (interstitial lung disease) ([place])  J84.9   2. Scleroderma ([place])  M34.9      HPI Lindsay Herman 73 y.o. -presents for follow-up of interstitial lung disease secondary to scleroderma.  Since her last visit approximately a year ago she continues to be stable.  Symptom  scores are mild.  SHe is on Imuran through Dr Bo Merino.  She reports no new complaints.  She is social distancing quite well.  She had questions about monitoring of her disease and future therapy.  She had pulmonary function test today that shows continued stability.  Her current symptom scores are mild.    SYMPTOM SCALE - ILD 10/27/2018   O2 use ra  Shortness of Breath symptoms 0 -> 5 scale with 5 being worst (score 6 If unable to do)  At rest 0  Simple tasks - showers, clothes change, eating, shaving 0  Household (dishes, doing bed, laundry) 1  Shopping 1  Walking level at own pace 1  Walking keeping up with others of same age 34  Walking up Stairs 3  Walking up Hill 3  Total (40 - 48) Dyspnea Score 10  How bad is your cough? 0  How bad is your fatigue 3         Results for Lindsay Herman, Lindsay Herman (MRN [id number]) as of 10/27/2018 09:56  Ref. Range 04/16/2016 12:44 11/10/2017 16:02 10/27/2018 08:53  FVC-Pre Latest Units: L 3.14 3.22 3.28  FVC-%Pred-Pre Latest Units: % 94 98 100   Results for Lindsay Herman, Lindsay Herman (MRN [id number]) as of 10/27/2018 09:56  Ref. Range 04/16/2016 12:44 10   /02/2017 16:02 10/27/2018 08:53  DLCO unc Latest Units: ml/min/mmHg 20.08 18.84 19.71  DLCO unc % pred Latest Units: % 70 66 92   ROS - per HPI     has a past medical history of Atrophic vaginitis, Cancer ([place]), Contact lens/glasses fitting, Cystocele, Osteopenia (06/2017), PONV (postoperative nausea and vomiting), Raynaud's disease, Rectocele, Rosacea, Scleroderma ([place]), and Urinary frequency.   reports that she quit smoking about 46 years ago. Her smoking use included cigarettes. She quit after 3.00 years of use. She has never used smokeless tobacco.  Past Surgical History:  Procedure Laterality Date  . APPENDECTOMY    . colon cancer    . COLON RESECTION  1995  . MASS EXCISION Left 08/31/2012   Procedure: EXCISION MUCOID CYST DEBRIDEMENT DISTAL INTERPHALANGEAL LEFT MIDDLE FINGER;  Surgeon: Wynonia Sours, MD;  Location: [facility];  Service: Orthopedics;  Laterality: Left;  . NOSE SURGERY    . OOPHORECTOMY     LSO  . ROTATOR CUFF REPAIR    . TONSILLECTOMY    . VAGINAL HYSTERECTOMY  1994    Allergies  Allergen Reactions  . Codeine Nausea And Vomiting  . Morphine And Related Nausea And Vomiting    Immunization History  Administered Date(s) Administered  . Influenza Split 12/12/2010  . Influenza, High Dose Seasonal PF 10/24/2017  . Pneumococcal Conjugate-13 07/24/2014  . Pneumococcal Polysaccharide-23 01/31/2009  . Zoster Recombinat (Shingrix) 11/27/2017, 02/12/2018    Family History  Problem Relation Age of Onset  . Diabetes Mother   . Heart disease Mother   . Stroke Mother   . Diabetes Brother   . Hypertension Brother   .  Lupus Father      Current Outpatient Medications:  .  azaTHIOprine (IMURAN) 50 MG tablet, TAKE 2 TABLETS BY MOUTH DAILY, Disp: 60 tablet, Rfl: 0 .  Multiple Vitamins-Minerals (PRESERVISION AREDS 2) CAPS, Take 1 capsule by mouth 2 (two) times daily., Disp: , Rfl:  .  rosuvastatin (CRESTOR) 20 MG tablet, Take 1 tablet (20 mg total) by mouth daily., Disp: 90 tablet, Rfl: 3 .  levothyroxine (SYNTHROID) 50 MCG tablet, Take 1 tablet by mouth daily., Disp: , Rfl:       Objective:   Vitals:   10/27/18 0944  BP: 122/76  Pulse: 85  Temp: 98.2 F (36.8 C)  SpO2: 97%  Weight: 165 lb (74.8 kg)  Height: 5\' 7"  (1.702 m)    Estimated body mass index is 25.84 kg/m as calculated from the following:   Height as of this encounter: 5\' 7"  (1.702 m).   Weight as of this encounter: 165 lb (74.8 kg).  @WEIGHTCHANGE @  [employer]   10/27/18 0944  Weight: 165 lb (74.8 kg)     Physical Exam  General Appearance:    Alert, cooperative, no distress, appears stated age - yes , Deconditioned looking - no , OBESE  - no, Sitting on Wheelchair -  no  Head:    Normocephalic, without obvious abnormality, atraumatic  Eyes:    PERRL,  conjunctiva/corneas clear,  Ears:    Normal TM's and external ear canals, both ears  Nose:   Nares normal, septum midline, mucosa normal, no drainage    or sinus tenderness. OXYGEN ON  - no . Patient is @ ra   Throat:   Lips, mucosa, and tongue normal; teeth and gums normal. Cyanosis on lips - no  Neck:   Supple, symmetrical, trachea midline, no adenopathy;    thyroid:  no enlargement/tenderness/nodules; no carotid   bruit or JVD  Back:     Symmetric, no curvature, ROM normal, no CVA tenderness  Lungs:     Distress - no , Wheeze no, Barrell Chest - no, Purse lip breathing - no, Crackles - mild at lung baes   Chest Wall:    No tenderness or deformity.    Heart:    Regular rate and rhythm, S1 and S2 normal, no rub   or gallop, Murmur - no  Breast Exam:    NOT DONE  Abdomen:     Soft, non-tender, bowel sounds active all four quadrants,    no masses, no organomegaly. Visceral obesity - no  Genitalia:   NOT DONE  Rectal:   NOT DONE  Extremities:   Extremities - normal, Has Cane - no, Clubbing - no, Edema - no  Pulses:   2+ and symmetric all extremities  Skin:   Stigmata of Connective Tissue Disease - mild sclerodactyly +  Lymph nodes:   Cervical, supraclavicular, and axillary nodes normal  Psychiatric:  Neurologic:   Pleasant - yes, Anxious - no, Flat affect - no  CAm-ICU - neg, Alert and Oriented x 3 - yes, Moves all 4s - yes, Speech - normal, Cognition - intact           Assessment:       ICD-10-CM   1. ILD (interstitial lung disease) ([place])  J84.9   2. Scleroderma ([place])  M34.9        Plan:     Patient Instructions     ICD-10-CM   1. ILD (interstitial lung disease) ([place])  J84.9   2. Scleroderma ([place])  M34.9  You have interstitial lung disease secondary to scleroderma. The good news is that it is mild and is also stable between March 2018 ->  October 2019 -> Sept 2020 However as explained this can become unpredictably progressive especially over time   Plan -At  this time I recommend you continue Imuran that was prescribed by Dr. Keturah Barre for your joint pain - I do not recommend adding anti-fibrotics at this time given stability of disease and GI side effects with this drug in context of prior colectomy - If you have intolerance to Imuran or this potential for progression then we can add nintedanib which is been in the market since October 2014 for another type of pulmonary fibrosis call IPF (nintedanib was recently approved for scleroderma interstitial lung disease]  -We will continue to monitor your situation with serial clinical visits lung function and flexible approach to CT scan --At this point in time you  did not need oxygen therapy -Continue to keep yourselff it -High-dose flu shot when possible this seasaon    Follow-up -In 9-12  months do spirometry and DLCO test - In 9-12 months do HRCT  -Return to ILD clinic with Dr Chase Caller after above in 9-12 months but after above tests     SIGNATURE    Dr. Brand Males, M.D., F.C.C.P,  Pulmonary and Critical Care Medicine Staff Physician, [place] Director - Interstitial Lung Disease  Program  Pulmonary [place] at [place], [place], [address]  Pager: [phone], If no answer or between  15:00h - 7:00h: call [phone] Telephone: [phone]  10:14 AM 10/27/2018

## 2018-10-27 NOTE — Progress Notes (Signed)
Spiro/DLCO performed today. 

## 2018-11-05 ENCOUNTER — Other Ambulatory Visit: Payer: Self-pay | Admitting: Orthopedic Surgery

## 2018-11-05 DIAGNOSIS — G8929 Other chronic pain: Secondary | ICD-10-CM

## 2018-11-05 DIAGNOSIS — M25562 Pain in left knee: Secondary | ICD-10-CM

## 2018-11-12 ENCOUNTER — Ambulatory Visit: Payer: Medicare Other | Admitting: Internal Medicine

## 2018-11-12 ENCOUNTER — Other Ambulatory Visit: Payer: Self-pay | Admitting: *Deleted

## 2018-11-12 MED ORDER — AZATHIOPRINE 50 MG PO TABS: 100.0000 mg | ORAL_TABLET | Freq: Every day | ORAL | 0 refills | Status: DC

## 2018-11-12 NOTE — Telephone Encounter (Signed)
Last Visit: 09/22/18 Next Visit: 02/22/18 Labs: 10/14/18 glucose is 106. Rest of CMP WNL. CBC WNL.   Okay to refill per Dr. Estanislado Pandy

## 2018-11-17 ENCOUNTER — Encounter: Payer: Self-pay | Admitting: Gynecology

## 2018-11-18 ENCOUNTER — Telehealth: Payer: Self-pay | Admitting: Pharmacist

## 2018-11-18 ENCOUNTER — Other Ambulatory Visit: Payer: Self-pay | Admitting: Pharmacist

## 2018-11-18 MED ORDER — AZATHIOPRINE 50 MG PO TABS: 75.0000 mg | ORAL_TABLET | Freq: Every day | ORAL | 0 refills | Status: DC

## 2018-11-18 NOTE — Telephone Encounter (Signed)
Received voicemail from patient asking about dosing of her Imuran.  Return patient call.  Patient states at last office visit she was instructed to decrease her dose of Imuran to 1-1/2 tablets (75 mg) daily.  Her most recent prescription stated to take 2 tablets daily.  Confirmed that patient should take 1-1/2 tablets daily and apologized for the incorrect directions on her prescription.  Updated her medication chart to reflect correct dose.  Patient verbalized understanding.  All questions encouraged and answered.  Instructed patient to call with any other questions or concerns.  Mariella Saa, PharmD, [place], [place] Clinical Specialty Pharmacist [phone]  11/18/2018 2:00 PM

## 2018-11-18 NOTE — Progress Notes (Signed)
Dose changed in August.  Update script to reflect current dose.

## 2018-11-24 ENCOUNTER — Ambulatory Visit
Admission: RE | Admit: 2018-11-24 | Discharge: 2018-11-24 | Disposition: A | Discharge status: Home / Self Care | Payer: Medicare Other | Source: Ambulatory Visit | Attending: Orthopedic Surgery | Admitting: Orthopedic Surgery

## 2018-11-24 ENCOUNTER — Other Ambulatory Visit: Payer: Self-pay

## 2018-11-24 DIAGNOSIS — G8929 Other chronic pain: Secondary | ICD-10-CM

## 2018-12-26 NOTE — Progress Notes (Signed)
Cardiology Office Note   Date:  12/27/2018   ID:  Lindsay Herman, DOB 02-Jun-1945, MRN [id number]  PCP:  Burnard Bunting, MD  Cardiologist:   Dorris Carnes, MD   F/U of CAD    History of Present Illness: Lindsay Herman is a 73 y.o. 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   Felt mild   Rx Imuran   Plan for spirometry f/u and f/u in clinic    The pt has atherosclerosis of aorta and coronary arteries    I saw the pt back in APril 2020  Since seen she has done okay from a cardiac standpoint she did say couple weeks ago she had an episode of chest pressure.  This occurred at rest.  It lasted several minutes.  No associated activity.  Activity is limited by her knees.  She is going to see Dr. Berenice Primas later this morning.  Question floating body and partially torn meniscus.  With activity her breathing is okay.  No chest pain with activity.  Current Meds  Medication Sig  . azaTHIOprine (IMURAN) 50 MG tablet Take 1.5 tablets (75 mg total) by mouth daily.  Marland Kitchen levothyroxine (SYNTHROID) 50 MCG tablet Take 1 tablet by mouth daily.  . Multiple Vitamins-Minerals (PRESERVISION AREDS 2) CAPS Take 1 capsule by mouth 2 (two) times daily.  . rosuvastatin (CRESTOR) 20 MG tablet Take 1 tablet (20 mg total) by mouth daily.  . TURMERIC PO Take by mouth as directed.     Allergies:   Codeine and Morphine and related   Past Medical History:  Diagnosis Date  . Atrophic vaginitis   . Cancer [facility])    Colon cancer  . Contact lens/glasses fitting    wears contacts or glasses  . Cystocele   . Osteopenia 06/2017   T score -2.0 FRAX 13% / 2.8%  .  PONV (postoperative nausea and vomiting)   . Raynaud's disease   . Rectocele   . Rosacea   . Scleroderma ([place])   . Urinary frequency     Past Surgical History:  Procedure Laterality Date  . APPENDECTOMY    . colon cancer    . COLON RESECTION  1995  . MASS EXCISION Left 08/31/2012   Procedure: EXCISION MUCOID CYST DEBRIDEMENT DISTAL INTERPHALANGEAL LEFT MIDDLE FINGER;  Surgeon: Wynonia Sours, MD;  Location: [place];  Service: Orthopedics;  Laterality: Left;  . NOSE SURGERY    . OOPHORECTOMY     LSO  . ROTATOR CUFF REPAIR    . TONSILLECTOMY    . VAGINAL HYSTERECTOMY  1994     Social History:  The patient  reports that she quit smoking about 46 years ago. Her smoking use included cigarettes. She quit after 3.00 years of use. She has never used smokeless tobacco. She reports current alcohol use. She reports that she does not use drugs.   Family History:  The patient's family history includes Diabetes in her brother and mother; Heart disease in her mother; Hypertension in her brother; Lupus in her father; Stroke in her  mother.    ROS:  Please see the history of present illness. All other systems are reviewed and  Negative to the above problem except as noted.    PHYSICAL EXAM: VS:  BP 140/86   Pulse 87   Ht 5' 6.5" (1.689 m)   Wt 168 lb 6.4 oz (76.4 kg)   BMI 26.77 kg/m   GEN: Well nourished, well developed, in no acute distress  HEENT: normal  Neck: JVP is nromal  NO carotid bruits Cardiac: RRR; no murmurs, rubs, or gallops,no edema  Respiratory:  clear to auscultation bilaterally, normal work of breathing GI: soft, nontender, nondistended, + BS  No hepatomegaly  MS: no deformity Moving all extremities   Skin: warm and dry, no rash Neuro:  Strength and sensation are intact Psych: euthymic mood, full affect   EKG:  EKG is ordered today.  NSR 87    Lipid Panel    Component Value Date/Time   CHOL 174 10/19/2018 0750   TRIG 69 10/19/2018 0750   HDL  62 10/19/2018 0750   CHOLHDL 2.8 10/19/2018 0750   LDLCALC 99 10/19/2018 0750      Wt Readings from Last 3 Encounters:  12/27/18 168 lb 6.4 oz (76.4 kg)  10/27/18 165 lb (74.8 kg)  09/22/18 169 lb (76.7 kg)      ASSESSMENT AND PLAN:  1.  CAD.  Found on cardiac on chest CT.  I am not convinced of active angina.  Will optimize lipids.    2 dyslipidemia.  Will check lipids on higher dose of Crestor.  3.  Interstitial lung disease.  Followed by Chase Caller.  4.  History of rheumatoid arthritis/scleroderma.  Followed by Dr. Dimas [place].  On medical therapy.  5  Orthopedic.  From a cardiac standpoint I think she is low risk and okay to proceed with any intervention.  We will set to see the patient back in August sooner with problems.     Current medicines are reviewed at length with the patient today.  The patient does not have concerns regarding medicines.  Signed, Dorris Carnes, MD  12/27/2018 8:22 AM    [facility] [address], [place], [place]  [address] Phone: [phone]; Fax: [phone]

## 2018-12-27 ENCOUNTER — Encounter: Payer: Self-pay | Admitting: Internal Medicine

## 2018-12-27 ENCOUNTER — Ambulatory Visit: Payer: Medicare Other | Admitting: Internal Medicine

## 2018-12-27 ENCOUNTER — Other Ambulatory Visit: Payer: Self-pay

## 2018-12-27 VITALS — BP 140/86 | HR 87 | Ht 66.5 in | Wt 168.4 lb

## 2018-12-27 DIAGNOSIS — E782 Mixed hyperlipidemia: Secondary | ICD-10-CM | POA: Diagnosis not present

## 2018-12-27 LAB — LIPID PANEL
Chol/HDL Ratio: 2.7 ratio (ref 0.0–4.4)
Cholesterol, Total: 164 mg/dL (ref 100–199)
HDL: 61 mg/dL (ref >39)
LDL Chol Calc (NIH): 89 mg/dL (ref 0–99)
Triglycerides: 75 mg/dL (ref 0–149)
VLDL Cholesterol Cal: 14 mg/dL (ref 5–40)

## 2018-12-27 LAB — AST: AST: 21 IU/L (ref 0–40)

## 2018-12-27 NOTE — Patient Instructions (Signed)
Medication Instructions:  No changes *If you need a refill on your cardiac medications before your next appointment, please call your pharmacy*  Lab Work: Today: lipids/ast If you have labs (blood work) drawn today and your tests are completely normal, you will receive your results only by: Marland Kitchen MyChart Message (if you have MyChart) OR . A paper copy in the mail If you have any lab test that is abnormal or we need to change your treatment, we will call you to review the results.  Testing/Procedures: none  Follow-Up: At [facility], you and your health needs are our priority.  As part of our continuing mission to provide you with exceptional heart care, we have created designated Provider Care Teams.  These Care Teams include your primary Cardiologist (physician) and Advanced Practice Providers (APPs -  Physician Assistants and Nurse Practitioners) who all work together to provide you with the care you need, when you need it.  Your next appointment:   9 months The format for your next appointment:   In Person  Provider:   Dorris Carnes, MD  Other Instructions

## 2019-01-04 ENCOUNTER — Telehealth: Payer: Self-pay | Admitting: *Deleted

## 2019-01-04 ENCOUNTER — Other Ambulatory Visit: Payer: Self-pay | Admitting: Orthopedic Surgery

## 2019-01-04 NOTE — Telephone Encounter (Signed)
   Primary Cardiologist: Dorris Carnes, MD  Chart reviewed as part of pre-operative protocol coverage. Given past medical history and time since last visit, based on ACC/AHA guidelines, Lindsay Herman would be at acceptable risk for the planned procedure without further cardiovascular testing. She was felt to be low risk and okay to proceed with any orthopedic intervention per Dr. Harrington Challenger at recent office visit.   I will route this recommendation to the requesting party via Epic fax function and remove from pre-op pool.  Please call with questions.  Daune Perch, NP 01/04/2019, 2:27 PM

## 2019-01-04 NOTE — Telephone Encounter (Signed)
   [facility] Pre-operative Risk Assessment    Request for surgical clearance:  1. What type of surgery is being performed? LEFT KNEE ARTHROSCOPY   2. When is this surgery scheduled? TBD   3. What type of clearance is required (medical clearance vs. Pharmacy clearance to hold med vs. Both)? MEDICAL  4. Are there any medications that need to be held prior to surgery and how long? NONE LISTED    5. Practice name and name of physician performing surgery? [facility]; DR. Jenny Reichmann [place]   6. What is your office phone number [phone]    7.   What is your office fax number [phone]  8.   Anesthesia type (None, local, MAC, general) ? CHOICE   Julaine Hua 01/04/2019, 1:42 PM  _________________________________________________________________   (provider comments below)

## 2019-01-11 ENCOUNTER — Other Ambulatory Visit (HOSPITAL_COMMUNITY)
Admission: RE | Admit: 2019-01-11 | Discharge: 2019-01-11 | Disposition: A | Discharge status: Home / Self Care | Payer: Medicare Other | Source: Ambulatory Visit | Attending: Orthopedic Surgery | Admitting: Orthopedic Surgery

## 2019-01-11 DIAGNOSIS — Z01812 Encounter for preprocedural laboratory examination: Secondary | ICD-10-CM | POA: Diagnosis present

## 2019-01-11 DIAGNOSIS — Z20828 Contact with and (suspected) exposure to other viral communicable diseases: Secondary | ICD-10-CM | POA: Insufficient documentation

## 2019-01-12 ENCOUNTER — Other Ambulatory Visit: Payer: Self-pay

## 2019-01-12 ENCOUNTER — Encounter (HOSPITAL_BASED_OUTPATIENT_CLINIC_OR_DEPARTMENT_OTHER): Payer: Self-pay | Admitting: *Deleted

## 2019-01-12 NOTE — Progress Notes (Signed)
Spoke w/ via phone for pre-op interview---Anaiyah Lab needs dos---- I stat 8              Lab results------lov dr Harrington Challenger 12-27-18 chart/epic, cardiac clearance note janine hammond np 01-04-2019 chart/epic, ekg 01-06-19 chart/epic, pulmonary function test 10-27-18 chart/epic, echo 04-16-16 chart/epic COVID test ------01-11-2019 Arrive at -------800 am 01-14-2019 NPO after ------midnight food, clear liquids until 700 am then npo Medications to take morning of surgery -----rosuvastatin Diabetic medication -----n/a Patient Special Instructions ----- Pre-Op special Istructions ----- Patient verbalized understanding of instructions that were given at this phone interview. Patient denies shortness of breath, chest pain, fever, cough a this phone interview.

## 2019-01-13 LAB — NOVEL CORONAVIRUS, NAA (HOSP ORDER, SEND-OUT TO REF LAB; TAT 18-24 HRS): SARS-CoV-2, NAA: NOT DETECTED

## 2019-01-14 ENCOUNTER — Ambulatory Visit (HOSPITAL_BASED_OUTPATIENT_CLINIC_OR_DEPARTMENT_OTHER)
Admission: RE | Admit: 2019-01-14 | Discharge: 2019-01-14 | Disposition: A | Discharge status: Home / Self Care | Payer: Medicare Other | Attending: Orthopedic Surgery | Admitting: Orthopedic Surgery

## 2019-01-14 ENCOUNTER — Encounter (HOSPITAL_BASED_OUTPATIENT_CLINIC_OR_DEPARTMENT_OTHER)
Admission: RE | Disposition: A | Discharge status: Home / Self Care | Payer: Self-pay | Source: Home / Self Care | Attending: Orthopedic Surgery

## 2019-01-14 ENCOUNTER — Ambulatory Visit (HOSPITAL_BASED_OUTPATIENT_CLINIC_OR_DEPARTMENT_OTHER): Payer: Medicare Other | Admitting: Anesthesiology

## 2019-01-14 ENCOUNTER — Other Ambulatory Visit: Payer: Self-pay

## 2019-01-14 ENCOUNTER — Encounter (HOSPITAL_BASED_OUTPATIENT_CLINIC_OR_DEPARTMENT_OTHER): Payer: Self-pay | Admitting: Anesthesiology

## 2019-01-14 DIAGNOSIS — Z791 Long term (current) use of non-steroidal anti-inflammatories (NSAID): Secondary | ICD-10-CM | POA: Insufficient documentation

## 2019-01-14 DIAGNOSIS — M94262 Chondromalacia, left knee: Secondary | ICD-10-CM | POA: Diagnosis present

## 2019-01-14 DIAGNOSIS — S83242A Other tear of medial meniscus, current injury, left knee, initial encounter: Secondary | ICD-10-CM | POA: Diagnosis present

## 2019-01-14 DIAGNOSIS — Z87891 Personal history of nicotine dependence: Secondary | ICD-10-CM | POA: Diagnosis not present

## 2019-01-14 DIAGNOSIS — Z885 Allergy status to narcotic agent status: Secondary | ICD-10-CM | POA: Insufficient documentation

## 2019-01-14 DIAGNOSIS — Z85038 Personal history of other malignant neoplasm of large intestine: Secondary | ICD-10-CM | POA: Diagnosis not present

## 2019-01-14 DIAGNOSIS — Z79899 Other long term (current) drug therapy: Secondary | ICD-10-CM | POA: Diagnosis not present

## 2019-01-14 DIAGNOSIS — M2342 Loose body in knee, left knee: Secondary | ICD-10-CM | POA: Diagnosis not present

## 2019-01-14 DIAGNOSIS — X58XXXA Exposure to other specified factors, initial encounter: Secondary | ICD-10-CM | POA: Diagnosis not present

## 2019-01-14 DIAGNOSIS — M1990 Unspecified osteoarthritis, unspecified site: Secondary | ICD-10-CM | POA: Insufficient documentation

## 2019-01-14 DIAGNOSIS — E039 Hypothyroidism, unspecified: Secondary | ICD-10-CM | POA: Diagnosis not present

## 2019-01-14 DIAGNOSIS — Z79890 Hormone replacement therapy (postmenopausal): Secondary | ICD-10-CM | POA: Diagnosis not present

## 2019-01-14 DIAGNOSIS — M2242 Chondromalacia patellae, left knee: Secondary | ICD-10-CM | POA: Diagnosis not present

## 2019-01-14 DIAGNOSIS — M349 Systemic sclerosis, unspecified: Secondary | ICD-10-CM | POA: Insufficient documentation

## 2019-01-14 HISTORY — DX: Fracture of nasal bones, initial encounter for closed fracture: S02.2XXA

## 2019-01-14 HISTORY — PX: KNEE ARTHROSCOPY: SHX127

## 2019-01-14 HISTORY — DX: Unspecified atherosclerosis: I70.90

## 2019-01-14 LAB — POCT I-STAT, CHEM 8
BUN: 19 mg/dL (ref 8–23)
Calcium, Ion: 1.31 mmol/L (ref 1.15–1.40)
Chloride: 104 mmol/L (ref 98–111)
Creatinine, Ser: 0.8 mg/dL (ref 0.44–1.00)
Glucose, Bld: 99 mg/dL (ref 70–99)
HCT: 40 % (ref 36.0–46.0)
Hemoglobin: 13.6 g/dL (ref 12.0–15.0)
Potassium: 4.1 mmol/L (ref 3.5–5.1)
Sodium: 140 mmol/L (ref 135–145)
TCO2: 27 mmol/L (ref 22–32)

## 2019-01-14 SURGERY — ARTHROSCOPY, KNEE
Anesthesia: Regional | Site: Knee | Laterality: Left

## 2019-01-14 MED ORDER — FENTANYL CITRATE (PF) 100 MCG/2ML IJ SOLN
INTRAMUSCULAR | Status: DC | PRN
  Administered 2019-01-14 (×4): 25 ug via INTRAVENOUS

## 2019-01-14 MED ORDER — EPINEPHRINE PF 1 MG/ML IJ SOLN
INTRAMUSCULAR | Status: DC | PRN
  Administered 2019-01-14: 1 mg

## 2019-01-14 MED ORDER — FENTANYL CITRATE (PF) 100 MCG/2ML IJ SOLN
25.0000 ug | INTRAMUSCULAR | Status: DC | PRN
  Administered 2019-01-14 (×2): 25 ug via INTRAVENOUS
  Filled 2019-01-14: qty 1

## 2019-01-14 MED ORDER — FENTANYL CITRATE (PF) 100 MCG/2ML IJ SOLN
50.0000 ug | Freq: Once | INTRAMUSCULAR | Status: AC
  Administered 2019-01-14: 50 ug via INTRAVENOUS
  Filled 2019-01-14: qty 1

## 2019-01-14 MED ORDER — PROMETHAZINE HCL 12.5 MG PO TABS: 12.5000 mg | ORAL_TABLET | Freq: Four times a day (QID) | ORAL | 0 refills | Status: DC | PRN

## 2019-01-14 MED ORDER — LACTATED RINGERS IV SOLN
INTRAVENOUS | Status: DC
  Administered 2019-01-14 (×2): via INTRAVENOUS
  Filled 2019-01-14: qty 1000

## 2019-01-14 MED ORDER — DEXAMETHASONE SODIUM PHOSPHATE 10 MG/ML IJ SOLN
INTRAMUSCULAR | Status: AC
  Filled 2019-01-14: qty 1

## 2019-01-14 MED ORDER — FENTANYL CITRATE (PF) 100 MCG/2ML IJ SOLN
INTRAMUSCULAR | Status: AC
  Filled 2019-01-14: qty 2

## 2019-01-14 MED ORDER — DEXAMETHASONE SODIUM PHOSPHATE 10 MG/ML IJ SOLN
INTRAMUSCULAR | Status: DC | PRN
  Administered 2019-01-14 (×2): 10 mg via INTRAVENOUS

## 2019-01-14 MED ORDER — KETOROLAC TROMETHAMINE 15 MG/ML IJ SOLN
15.0000 mg | Freq: Once | INTRAMUSCULAR | Status: DC | PRN
  Filled 2019-01-14: qty 1

## 2019-01-14 MED ORDER — ONDANSETRON HCL 4 MG/2ML IJ SOLN
4.0000 mg | Freq: Once | INTRAMUSCULAR | Status: DC | PRN
  Filled 2019-01-14: qty 2

## 2019-01-14 MED ORDER — CEFAZOLIN SODIUM-DEXTROSE 2-4 GM/100ML-% IV SOLN
2.0000 g | INTRAVENOUS | Status: DC
  Filled 2019-01-14: qty 100

## 2019-01-14 MED ORDER — SODIUM CHLORIDE 0.9 % IR SOLN
Status: DC | PRN
  Administered 2019-01-14: 3000 mL

## 2019-01-14 MED ORDER — SCOPOLAMINE 1 MG/3DAYS TD PT72
MEDICATED_PATCH | TRANSDERMAL | Status: AC
  Filled 2019-01-14: qty 1

## 2019-01-14 MED ORDER — ONDANSETRON HCL 4 MG/2ML IJ SOLN
INTRAMUSCULAR | Status: DC | PRN
  Administered 2019-01-14: 4 mg via INTRAVENOUS

## 2019-01-14 MED ORDER — LIDOCAINE 2% (20 MG/ML) 5 ML SYRINGE
INTRAMUSCULAR | Status: DC | PRN
  Administered 2019-01-14: 60 mg via INTRAVENOUS

## 2019-01-14 MED ORDER — PROPOFOL 10 MG/ML IV BOLUS
INTRAVENOUS | Status: AC
  Filled 2019-01-14: qty 20

## 2019-01-14 MED ORDER — CHLORHEXIDINE GLUCONATE 4 % EX LIQD
60.0000 mL | Freq: Once | CUTANEOUS | Status: DC
  Filled 2019-01-14: qty 118

## 2019-01-14 MED ORDER — ACETAMINOPHEN 500 MG PO TABS
ORAL_TABLET | ORAL | Status: AC
  Filled 2019-01-14: qty 2

## 2019-01-14 MED ORDER — HYDROCODONE-ACETAMINOPHEN 5-325 MG PO TABS: 1.0000 | ORAL_TABLET | Freq: Four times a day (QID) | ORAL | 0 refills | Status: DC | PRN

## 2019-01-14 MED ORDER — SCOPOLAMINE 1 MG/3DAYS TD PT72
1.0000 | MEDICATED_PATCH | TRANSDERMAL | Status: DC
  Administered 2019-01-14: 1.5 mg via TRANSDERMAL
  Filled 2019-01-14: qty 1

## 2019-01-14 MED ORDER — BUPIVACAINE HCL (PF) 0.25 % IJ SOLN
INTRAMUSCULAR | Status: DC | PRN
  Administered 2019-01-14: 20 mL

## 2019-01-14 MED ORDER — ACETAMINOPHEN 500 MG PO TABS
1000.0000 mg | ORAL_TABLET | Freq: Once | ORAL | Status: AC
  Administered 2019-01-14: 1000 mg via ORAL
  Filled 2019-01-14: qty 2

## 2019-01-14 MED ORDER — LIDOCAINE 2% (20 MG/ML) 5 ML SYRINGE
INTRAMUSCULAR | Status: AC
  Filled 2019-01-14: qty 5

## 2019-01-14 MED ORDER — ONDANSETRON HCL 4 MG/2ML IJ SOLN
INTRAMUSCULAR | Status: AC
  Filled 2019-01-14: qty 2

## 2019-01-14 MED ORDER — PROPOFOL 10 MG/ML IV BOLUS
INTRAVENOUS | Status: AC
  Filled 2019-01-14: qty 40

## 2019-01-14 MED ORDER — PROPOFOL 10 MG/ML IV BOLUS
INTRAVENOUS | Status: DC | PRN
  Administered 2019-01-14: 150 mg via INTRAVENOUS

## 2019-01-14 MED ORDER — ROPIVACAINE HCL 5 MG/ML IJ SOLN
INTRAMUSCULAR | Status: DC | PRN
  Administered 2019-01-14: 30 mL via PERINEURAL

## 2019-01-14 MED ORDER — CEFAZOLIN SODIUM-DEXTROSE 2-4 GM/100ML-% IV SOLN
INTRAVENOUS | Status: AC
  Filled 2019-01-14: qty 100

## 2019-01-14 SURGICAL SUPPLY — 32 items
BNDG COHESIVE 6X5 TAN NS LF (GAUZE/BANDAGES/DRESSINGS) ×3 IMPLANT
BNDG ELASTIC 6X5.8 VLCR STR LF (GAUZE/BANDAGES/DRESSINGS) ×3 IMPLANT
BOOTIES KNEE HIGH SLOAN (MISCELLANEOUS) ×6 IMPLANT
CLOTH BEACON ORANGE TIMEOUT ST (SAFETY) ×3 IMPLANT
COVER WAND RF STERILE (DRAPES) ×3 IMPLANT
DRAPE ARTHROSCOPY W/POUCH 114 (DRAPES) ×3 IMPLANT
DRSG EMULSION OIL 3X3 NADH (GAUZE/BANDAGES/DRESSINGS) ×3 IMPLANT
DURAPREP 26ML APPLICATOR (WOUND CARE) ×3 IMPLANT
DW OUTFLOW CASSETTE/TUBE SET (MISCELLANEOUS) ×3 IMPLANT
GAUZE SPONGE 4X4 12PLY STRL (GAUZE/BANDAGES/DRESSINGS) ×3 IMPLANT
GLOVE ECLIPSE 7.5 STRL STRAW (GLOVE) ×6 IMPLANT
GLOVE INDICATOR 8.0 STRL GRN (GLOVE) ×6 IMPLANT
GOWN STRL REUS W/TWL LRG LVL3 (GOWN DISPOSABLE) ×3 IMPLANT
GOWN STRL REUS W/TWL XL LVL3 (GOWN DISPOSABLE) ×3 IMPLANT
KIT TURNOVER CYSTO (KITS) ×3 IMPLANT
KNEE WRAP E Z 3 GEL PACK (MISCELLANEOUS) ×3 IMPLANT
MANIFOLD NEPTUNE II (INSTRUMENTS) ×3 IMPLANT
NDL SAFETY ECLIPSE 18X1.5 (NEEDLE) ×1 IMPLANT
NEEDLE FILTER BLUNT 18X 1/2SAF (NEEDLE) ×2
NEEDLE FILTER BLUNT 18X1 1/2 (NEEDLE) ×1 IMPLANT
NEEDLE HYPO 18GX1.5 SHARP (NEEDLE) ×2
PACK ARTHROSCOPY DSU (CUSTOM PROCEDURE TRAY) ×3 IMPLANT
PACK BASIN DAY SURGERY FS (CUSTOM PROCEDURE TRAY) ×3 IMPLANT
PAD ABD 8X10 STRL (GAUZE/BANDAGES/DRESSINGS) ×3 IMPLANT
PAD CAST 4YDX4 CTTN HI CHSV (CAST SUPPLIES) ×1 IMPLANT
PADDING CAST COTTON 4X4 STRL (CAST SUPPLIES) ×2
SUT ETHILON 4 0 PS 2 18 (SUTURE) ×3 IMPLANT
SYR 10ML LL (SYRINGE) ×3 IMPLANT
TOWEL OR 17X26 10 PK STRL BLUE (TOWEL DISPOSABLE) ×6 IMPLANT
TUBING ARTHROSCOPY IRRIG 16FT (MISCELLANEOUS) ×3 IMPLANT
WATER STERILE IRR 500ML POUR (IV SOLUTION) ×3 IMPLANT
WRAP KNEE MAXI GEL POST OP (GAUZE/BANDAGES/DRESSINGS) ×3 IMPLANT

## 2019-01-14 NOTE — Anesthesia Preprocedure Evaluation (Addendum)
Anesthesia Evaluation  Patient identified by MRN, date of birth, ID band Patient awake    Reviewed: Allergy & Precautions, H&P , NPO status , Patient's Chart, lab work & pertinent test results  History of Anesthesia Complications (+) PONV and history of anesthetic complications  Airway Mallampati: II  TM Distance: >3 FB Neck ROM: Full    Dental  (+) Teeth Intact, Dental Advisory Given   Pulmonary former smoker,  Former smoker, quit 1974   Pulmonary exam normal breath sounds clear to auscultation       Cardiovascular negative cardio ROS Normal cardiovascular exam Rhythm:Regular Rate:Normal     Neuro/Psych negative neurological ROS  negative psych ROS   GI/Hepatic Neg liver ROS, GERD  ,Hx rectocele  Hx CRC s/p resection and chemo   Endo/Other  Hypothyroidism scleroderma  Renal/GU negative Renal ROS   Hx cystocele, urinary frequency    Musculoskeletal  (+) Arthritis , Osteoarthritis,  Scleroderma, raynauds   Abdominal   Peds  Hematology negative hematology ROS (+)   Anesthesia Other Findings Sicca syndrome  Reproductive/Obstetrics negative OB ROS                             Anesthesia Physical  Anesthesia Plan  ASA: II  Anesthesia Plan: General and Regional   Post-op Pain Management: GA combined w/ Regional for post-op pain   Induction: Intravenous  PONV Risk Score and Plan: 4 or greater and Ondansetron, Dexamethasone, Scopolamine patch - Pre-op, Treatment may vary due to age or medical condition and Metaclopromide  Airway Management Planned: LMA  Additional Equipment: None  Intra-op Plan:   Post-operative Plan: Extubation in OR  Informed Consent: I have reviewed the patients History and Physical, chart, labs and discussed the procedure including the risks, benefits and alternatives for the proposed anesthesia with the patient or authorized representative who has  indicated his/her understanding and acceptance.     Dental advisory given  Plan Discussed with: CRNA  Anesthesia Plan Comments: (Pt/surgeon request for nerve block)       Anesthesia Quick Evaluation

## 2019-01-14 NOTE — Anesthesia Postprocedure Evaluation (Signed)
Anesthesia Post Note  Patient: Lindsay Herman  Procedure(s) Performed: ARTHROSCOPY KNEE, PARTIAL MENISCECTOMY, CHONDROPLASTY AND LOOSE BODY REMOVAL (Left Knee)     Patient location during evaluation: PACU Anesthesia Type: Regional and General Level of consciousness: awake and alert Pain management: pain level controlled Vital Signs Assessment: post-procedure vital signs reviewed and stable Respiratory status: spontaneous breathing, nonlabored ventilation, respiratory function stable and patient connected to nasal cannula oxygen Cardiovascular status: blood pressure returned to baseline and stable Postop Assessment: no apparent nausea or vomiting Anesthetic complications: no    Last Vitals:  Vitals:   01/14/19 0915 01/14/19 0920  BP: (!) 159/84 (!) 165/87  Pulse:    Resp: 20 20  Temp:    SpO2: 100% 100%    Last Pain:  Vitals:   01/14/19 0913  TempSrc:   PainSc: 0-No pain                 Pervis Hocking

## 2019-01-14 NOTE — Op Note (Signed)
NAME: Lindsay Herman, Lindsay Herman MEDICAL RECORD [id number] ACCOUNT [id number] DATE OF BIRTH:January 22, 1946 FACILITY: [facility] LOCATION: [facility] PHYSICIAN:Daemon Dowty L. Quasean Frye, MD  OPERATIVE REPORT  DATE OF PROCEDURE:  01/14/2019  PREOPERATIVE DIAGNOSIS:  Medial meniscal tear and multiple chondral loose bodies.  POSTOPERATIVE DIAGNOSES: 1.  Medial meniscal tear and multiple chondral loose bodies. 2.  Significant chondral injury to the medial femoral condyle.  PRINCIPAL PROCEDURE: 1.  Left knee arthroscopy with partial medial meniscectomy. 2.  Chondroplasty medial femoral condyle. 3.  Medial plica excision. 4.  Removal of multiple cartilaginous loose bodies.  SURGEON:  Dorna Leitz, MD  ASSISTANT:  Gaspar Skeeters PA-C, who was present for the entire case and assisted by manipulation of the leg and closing to minimize OR time.  BRIEF HISTORY:  The patient is a 73 year old female with a long history of significant complaints of left knee pain that has been treated conservatively for a prolonged period of time.  After failure of all conservative care, she is taken to the  operating room for left knee arthroscopy.  Preoperative MRI showing medial meniscal tear, multiple loose bodies.  She was having night pain, light activity pain and catching grabbing pain.  DESCRIPTION OF PROCEDURE:  The patient was taken to the operating room after adequate anesthesia was obtained with general anesthetic.  The patient brought to the operating table.  Left leg was prepped and draped in usual sterile fashion.  Following  this, routine arthroscopic examination of the knee revealed that there was significant chondromalacia of the patellofemoral joint, which was debrided back to a smooth stable rim of articular cartilage.  We started encountering loose bodies right when we  went into the knee and multiple were encountered.  We went into the medial compartment.  After debriding a dense plica, we were able to see the medial  meniscus.  It had a significant root tear which was debrided back to a smooth and stable rim of  articular cartilage.  We probably took about 15% of the posterior horn of the meniscus and we debrided this back to a smooth stable rim of meniscus.  Once that was done, we then went and grasped the loose bodies with graspers.  A sucker shaver was used  multiple times taking in loose bodies while we were in there.  We got a probe down over the medial tibial plateau back just posterior to the MCL where she had a loose body and we were able to sort of ferret out a loose body from there and suck that out  as well.  We went to the ACL which was normal.  We went to the lateral side which was normal.  We did a thorough checkup in the medial and lateral gutters for any additional loose bodies and did find one more loose body up in the medial gutter.  We then  went back in the medial and lateral gutters and did a thorough examination and evaluation seeing no additional loose bodies.  At this time, the knee was copiously and thoroughly irrigated and suctioned dry.  The arthroscopic portals closed with a  bandage.  Sterile compressive dressing was applied after 20 mL of 0.25% Marcaine was instilled in the knee for postoperative anesthesia.  The patient was taken to recovery and was noted to be in satisfactory condition.  Estimated blood loss for procedure  was minimal.  TN/NUANCE  D:01/14/2019 T:01/14/2019 JOB:[id number]

## 2019-01-14 NOTE — Addendum Note (Signed)
Addendum  created 01/14/19 1448 by Justice Rocher, CRNA   Charge Capture section accepted

## 2019-01-14 NOTE — Progress Notes (Signed)
Assisted Dr. Finucane with left, ultrasound guided, adductor canal block. Side rails up, monitors on throughout procedure. See vital signs in flow sheet. Tolerated Procedure well. °

## 2019-01-14 NOTE — H&P (Signed)
A pre op hand p   Chief Complaint: l knee pain  HPI: Lindsay Herman is a 73 y.o. female who presents for evaluation of l knee pain. It has been present for greater than 3 months and has been worsening. She has failed conservative measures. Pain is rated as moderate.  Past Medical History:  Diagnosis Date  . Atherosclerosis    aorta and coronary artery  . Atrophic vaginitis   . Cancer [facility]) 1995   Colon cancer tx surgery and chemo  . Contact lens/glasses fitting    wears contacts or glasses  . Cystocele   . Nasal fracture   . Osteopenia 06/2017   T score -2.0 FRAX 13% / 2.8%  . PONV (postoperative nausea and vomiting)   . Raynaud's disease   . Rectocele   . Rosacea   . Scleroderma ([place])   . Urinary frequency    Past Surgical History:  Procedure Laterality Date  . APPENDECTOMY    . colon cancer  1995  . COLON RESECTION  1995  . MASS EXCISION Left 08/31/2012   Procedure: EXCISION MUCOID CYST DEBRIDEMENT DISTAL INTERPHALANGEAL LEFT MIDDLE FINGER;  Surgeon: Wynonia Sours, MD;  Location: [facility];  Service: Orthopedics;  Laterality: Left;  . OOPHORECTOMY     LSO  . ROTATOR CUFF REPAIR Right   . TONSILLECTOMY    . VAGINAL HYSTERECTOMY  1994   Social History   Socioeconomic History  . Marital status: Married    Spouse name: Not on file  . Number of children: Not on file  . Years of education: Not on file  . Highest education level: Not on file  Occupational History  . Not on file  Social Needs  . Financial resource strain: Not on file  . Food insecurity    Worry: Not on file    Inability: Not on file  . Transportation needs    Medical: Not on file    Non-medical: Not on file  Tobacco Use  . Smoking status: Former Smoker    Years: 3.00    Types: Cigarettes    Quit date: 08/25/1972    Years since quitting: 46.4  . Smokeless tobacco: Never Used  Substance and Sexual Activity  . Alcohol use: Yes    Comment: wine 1 glas per day  . Drug use: Never   . Sexual activity: Never    Birth control/protection: Surgical    Comment: 1st intercourse 73 yo-Fewer than 5 partners  Lifestyle  . Physical activity    Days per week: Not on file    Minutes per session: Not on file  . Stress: Not on file  Relationships  . Social Herbalist on phone: Not on file    Gets together: Not on file    Attends religious service: Not on file    Active member of club or organization: Not on file    Attends meetings of clubs or organizations: Not on file    Relationship status: Not on file  Other Topics Concern  . Not on file  Social History Narrative  . Not on file   Family History  Problem Relation Age of Onset  . Diabetes Mother   . Heart disease Mother   . Stroke Mother   . Diabetes Brother   . Hypertension Brother   . Lupus Father    Allergies  Allergen Reactions  . Codeine Nausea And Vomiting  . Morphine And Related Nausea And Vomiting  Prior to Admission medications   Medication Sig Start Date End Date Taking? Authorizing Provider  Coenzyme Q10 (COQ10) 200 MG CAPS Take by mouth daily.   Yes [provider]  meloxicam (MOBIC) 15 MG tablet Take 15 mg by mouth at bedtime.   Yes [provider]  NON FORMULARY Mind boost 1 tab in am(vitamin d 3 800 iu, vitamin 8 6 10  mcg, vitamin b12 and folate)   Yes [provider]  azaTHIOprine (IMURAN) 50 MG tablet Take 1.5 tablets (75 mg total) by mouth daily. Patient taking differently: Take 75 mg by mouth at bedtime.  11/18/18   Bo Merino, MD  levothyroxine (SYNTHROID) 50 MCG tablet Take 1 tablet by mouth every evening.  09/29/18   [provider]  Multiple Vitamins-Minerals (PRESERVISION AREDS 2) CAPS Take 1 capsule by mouth 2 (two) times daily.    [provider]  rosuvastatin (CRESTOR) 20 MG tablet Take 1 tablet (20 mg total) by mouth daily. 10/25/18   Fay Records, MD  TURMERIC PO Take by mouth as directed.    [provider]      Positive ROS: none  All other systems have been reviewed and were otherwise negative with the exception of those mentioned in the HPI and as above.  Physical Exam: There were no vitals filed for this visit.  General: Alert, no acute distress Cardiovascular: No pedal edema Respiratory: No cyanosis, no use of accessory musculature GI: No organomegaly, abdomen is soft and non-tender Skin: No lesions in the area of chief complaint Neurologic: Sensation intact distally Psychiatric: Patient is competent for consent with normal mood and affect Lymphatic: No axillary or cervical lymphadenopathy  MUSCULOSKELETAL: l knee: painful rom limited rom no instability.  Assessment/Plan: RIGHT KNEE MEDIAL MENISCUS TEAR AND LOOSE BODY Plan for Procedure(s): ARTHROSCOPY KNEE  The risks benefits and alternatives were discussed with the patient including but not limited to the risks of nonoperative treatment, versus surgical intervention including infection, bleeding, nerve injury, malunion, nonunion, hardware prominence, hardware failure, need for hardware removal, blood clots, cardiopulmonary complications, morbidity, mortality, among others, and they were willing to proceed.  Predicted outcome is good, although there will be at least a six to nine month expected recovery.  [name], MD 01/14/2019 6:54 AM

## 2019-01-14 NOTE — Brief Op Note (Signed)
01/14/2019  10:15 AM  PATIENT:  Lindsay Herman  73 y.o. female  PRE-OPERATIVE DIAGNOSIS:  RIGHT KNEE MEDIAL MENISCUS TEAR AND LOOSE BODY  POST-OPERATIVE DIAGNOSIS:  RIGHT KNEE MEDIAL MENISCUS TEAR AND LOOSE BODY  PROCEDURE:  Procedure(s): ARTHROSCOPY KNEE, PARTIAL MENISCECTOMY, CHONDROPLASTY AND LOOSE BODY REMOVAL (Left)  SURGEON:  Surgeon(s) and Role:    Dorna Leitz, MD - Primary  PHYSICIAN ASSISTANT:   ASSISTANTS: [place]   ANESTHESIA:   general  EBL:  10 mL   BLOOD ADMINISTERED:none  DRAINS: none   LOCAL MEDICATIONS USED:  MARCAINE     SPECIMEN:  No Specimen  DISPOSITION OF SPECIMEN:  N/A  COUNTS:  YES  TOURNIQUET:  * No tourniquets in log *  DICTATION: .Other Dictation: Dictation Number [id number]  PLAN OF CARE: Discharge to home after PACU  PATIENT DISPOSITION:  PACU - hemodynamically stable.   Delay start of Pharmacological VTE agent (>24hrs) due to surgical blood loss or risk of bleeding: no

## 2019-01-14 NOTE — Interval H&P Note (Signed)
History and Physical Interval Note:  01/14/2019 9:24 AM  Lindsay Herman  has presented today for surgery, with the diagnosis of RIGHT KNEE MEDIAL MENISCUS TEAR AND LOOSE BODY.  The various methods of treatment have been discussed with the patient and family. After consideration of risks, benefits and other options for treatment, the patient has consented to  Procedure(s): ARTHROSCOPY KNEE (Left) as a surgical intervention.  The patient's history has been reviewed, patient examined, no change in status, stable for surgery.  I have reviewed the patient's chart and labs.  Questions were answered to the patient's satisfaction.     [name]

## 2019-01-14 NOTE — Anesthesia Procedure Notes (Signed)
Procedure Name: LMA Insertion Date/Time: 01/14/2019 9:33 AM Performed by: Justice Rocher, CRNA Pre-anesthesia Checklist: Patient identified, Emergency Drugs available, Suction available and Patient being monitored Patient Re-evaluated:Patient Re-evaluated prior to induction Oxygen Delivery Method: Circle system utilized Preoxygenation: Pre-oxygenation with 100% oxygen Induction Type: IV induction Ventilation: Mask ventilation without difficulty LMA: LMA inserted LMA Size: 4.0 Number of attempts: 1 Airway Equipment and Method: Bite block Placement Confirmation: positive ETCO2 and breath sounds checked- equal and bilateral Tube secured with: Tape Dental Injury: Teeth and Oropharynx as per pre-operative assessment

## 2019-01-14 NOTE — Anesthesia Procedure Notes (Signed)
Anesthesia Regional Block: Adductor canal block   Pre-Anesthetic Checklist: ,, timeout performed, Correct Patient, Correct Site, Correct Laterality, Correct Procedure, Correct Position, site marked, Risks and benefits discussed,  Surgical consent,  Pre-op evaluation,  At surgeon's request and post-op pain management  Laterality: Left  Prep: Maximum Sterile Barrier Precautions used, chloraprep       Needles:  Injection technique: Single-shot  Needle Type: Echogenic Stimulator Needle     Needle Length: 9cm  Needle Gauge: 22     Additional Needles:   Procedures:,,,, ultrasound used (permanent image in chart),,,,  Narrative:  Start time: 01/14/2019 9:15 AM End time: 01/14/2019 9:20 AM Injection made incrementally with aspirations every 5 mL.  Performed by: Personally  Anesthesiologist: Pervis Hocking, DO  Additional Notes: Monitors applied. No increased pain on injection. No increased resistance to injection. Injection made in 5cc increments. Good needle visualization. Patient tolerated procedure well.

## 2019-01-14 NOTE — Transfer of Care (Signed)
Immediate Anesthesia Transfer of Care Note  Patient: Lindsay Herman  Procedure(s) Performed: Procedure(s) (LRB): ARTHROSCOPY KNEE, PARTIAL MENISCECTOMY, CHONDROPLASTY AND LOOSE BODY REMOVAL (Left)  Patient Location: PACU  Anesthesia Type: General  Level of Consciousness: awake, sedated, patient cooperative and responds to stimulation  Airway & Oxygen Therapy: Patient Spontanous Breathing and Patient connected to NC02 and soft FM   Post-op Assessment: Report given to PACU RN, Post -op Vital signs reviewed and stable and Patient moving all extremities  Post vital signs: Reviewed and stable  Complications: No apparent anesthesia complications

## 2019-01-14 NOTE — Discharge Instructions (Signed)
POST-OP KNEE ARTHROSCOPY INSTRUCTIONS  Dr. Alain Marion PA-C  Pain You will be expected to have a moderate amount of pain in the affected knee for approximately two weeks. However, the first two days will be the most severe pain. A prescription has been provided to take as needed for the pain. The pain can be reduced by applying ice packs to the knee for the first 1-2 weeks post surgery. Also, keeping the leg elevated on pillows will help alleviate the pain. If you develop any acute pain or swelling in your calf muscle, please call the doctor.  Activity It is preferred that you stay at bed rest for approximately 24 hours. However, you may go to the bathroom with help. Weight bearing as tolerated. You may begin the knee exercises the day of surgery. Discontinue crutches as the knee pain resolves.  Dressing Keep the dressing dry. If the ace bandage should wrinkle or roll up, this can be rewrapped to prevent ridges in the bandage. You may remove all dressings in 48 hours,  apply bandaids to each wound. You may shower on the 4th day after surgery but no tub bath.  Symptoms to report to your doctor Extreme pain Extreme swelling Temperature above 101 degrees Change in the feeling, color, or movement of your toes Redness, heat, or swelling at your incision  Exercise If is preferred that as soon as possible you try to do a straight leg raise without bending the knee and concentrate on bringing the heel of your foot off the bed up to approximately 45 degrees and hold for the count of 10 seconds. Repeat this at least 10 times three or four times per day. Additional exercises are provided below.  You are encouraged to bend the knee as tolerated.  Follow-Up Call to schedule a follow-up appointment in 5-7 days. Our office # is [phone].  POST-OP EXERCISES  Short Arc Quads  1. Lie on back with legs straight. Place towel roll under thigh, just above knee. 2. Tighten thigh muscles to  straighten knee and lift heel off bed. 3. Hold for slow count of five, then lower. 4. Do three sets of ten    Straight Leg Raises  1. Lie on back with operative leg straight and other leg bent. 2. Keeping operative leg completely straight, slowly lift operative leg so foot is 5 inches off bed. 3. Hold for slow count of five, then lower. 4. Do three sets of ten.    DO BOTH EXERCISES 2 TIMES A DAY  Ankle Pumps  Work/move the operative ankle and foot up and down 10 times every hour while awake.    Regional Anesthesia Blocks  1. Numbness or the inability to move the "blocked" extremity may last from 3-48 hours after placement. The length of time depends on the medication injected and your individual response to the medication. If the numbness is not going away after 48 hours, call your surgeon.  2. The extremity that is blocked will need to be protected until the numbness is gone and the  Strength has returned. Because you cannot feel it, you will need to take extra care to avoid injury. Because it may be weak, you may have difficulty moving it or using it. You may not know what position it is in without looking at it while the block is in effect.  3. For blocks in the legs and feet, returning to weight bearing and walking needs to be done carefully. You will need to wait  until the numbness is entirely gone and the strength has returned. You should be able to move your leg and foot normally before you try and bear weight or walk. You will need someone to be with you when you first try to ensure you do not fall and possibly risk injury.  4. Bruising and tenderness at the needle site are common side effects and will resolve in a few days.  5. Persistent numbness or new problems with movement should be communicated to the surgeon or the [facility] [phone] [address] [phone]).  Post Anesthesia Home Care Instructions  Activity: Get plenty of rest  for the remainder of the day. A responsible adult should stay with you for 24 hours following the procedure.  For the next 24 hours, DO NOT: -Drive a car -Paediatric nurse -Drink alcoholic beverages -Take any medication unless instructed by your physician -Make any legal decisions or sign important papers.  Meals: Start with liquid foods such as gelatin or soup. Progress to regular foods as tolerated. Avoid greasy, spicy, heavy foods. If nausea and/or vomiting occur, drink only clear liquids until the nausea and/or vomiting subsides. Call your physician if vomiting continues.  Special Instructions/Symptoms: Your throat may feel dry or sore from the anesthesia or the breathing tube placed in your throat during surgery. If this causes discomfort, gargle with warm salt water. The discomfort should disappear within 24 hours.  If you had a scopolamine patch placed behind your ear for the management of post- operative nausea and/or vomiting:  1. The medication in the patch is effective for 72 hours, after which it should be removed.  Wrap patch in a tissue and discard in the trash. Wash hands thoroughly with soap and water. 2. You may remove the patch earlier than 72 hours if you experience unpleasant side effects which may include dry mouth, dizziness or visual disturbances. 3. Avoid touching the patch. Wash your hands with soap and water after contact with the patch.

## 2019-01-17 ENCOUNTER — Encounter (HOSPITAL_BASED_OUTPATIENT_CLINIC_OR_DEPARTMENT_OTHER): Payer: Self-pay | Admitting: Orthopedic Surgery

## 2019-02-07 ENCOUNTER — Telehealth: Payer: Self-pay

## 2019-02-07 DIAGNOSIS — Z79899 Other long term (current) drug therapy: Secondary | ICD-10-CM

## 2019-02-07 NOTE — Telephone Encounter (Signed)
Patient requested lab orders to be sent to her PCP. Lab orders have been faxed. Patient will call to schedule lab draw appointment.

## 2019-02-09 ENCOUNTER — Telehealth: Payer: Self-pay | Admitting: Rheumatology

## 2019-02-09 NOTE — Telephone Encounter (Signed)
Patient called stating she was given a different fax # to send her labwork orders.  Patient is requesting they be re-faxed to  [phone]  Attn: Dr. Reynaldo Minium

## 2019-02-09 NOTE — Telephone Encounter (Signed)
Lab orders have been faxed to number provided by patient.

## 2019-02-15 NOTE — Progress Notes (Signed)
Virtual Visit via Video Note  I connected with Lindsay Herman on 02/23/19 at  9:45 AM EST by a video enabled telemedicine application and verified that I am speaking with the correct person using two identifiers.  Location: Patient: Home  Provider: Clinic  This service was conducted via virtual visit.  Both audio and visual tools were used.  The patient was located at home. I was located in my office.  Consent was obtained prior to the virtual visit and is aware of possible charges through their insurance for this visit.  The patient is an established patient.  Dr. Estanislado Pandy, MD conducted the virtual visit and Hazel Sams, PA-C acted as scribe during the service.  Office staff helped with scheduling follow up visits after the service was conducted.     I discussed the limitations of evaluation and management by telemedicine and the availability of in person appointments. The patient expressed understanding and agreed to proceed.  CC: Right hand pain  History of Present Illness: Lindsay Herman is a 74 y.o. female with history of rheumatoid arthritis, scleroderma, sicca symptoms and osteoarthritis. She is taking Imuran 75 mg po daily. She is experiencing right hand pain and intermittent inflammation.  She has noticed decreased grip strength and difficulty opening jars.  She has ongoing sicca symptoms but they have been tolerable. She has not had any recent symptoms of Raynaud's.  She denies any new or worsening pulmonary symptoms.    Review of Systems  Constitutional: Positive for malaise/fatigue. Negative for fever.  HENT:       +Dry mouth  Eyes: Negative for photophobia, pain, discharge and redness.       +Dry eyes  Respiratory: Negative for cough, shortness of breath and wheezing.   Cardiovascular: Negative for chest pain, palpitations and leg swelling.  Gastrointestinal: Positive for constipation. Negative for blood in stool and diarrhea.  Genitourinary: Negative for dysuria and  frequency.  Musculoskeletal: Positive for joint pain. Negative for back pain, myalgias and neck pain.  Skin: Negative for rash.  Neurological: Negative for dizziness, weakness and headaches.  Endo/Heme/Allergies: Does not bruise/bleed easily.  Psychiatric/Behavioral: Negative for depression and memory loss. The patient is not nervous/anxious and does not have insomnia.      Observations/Objective:   Physical Exam  Constitutional: She is oriented to person, place, and time.  Neurological: She is alert and oriented to person, place, and time.  Psychiatric: Mood, memory, affect and judgment normal.     Patient reports morning stiffness for  10 minutes.   Patient reports nocturnal pain.  Difficulty dressing/grooming: Denies Difficulty climbing stairs: Reports Difficulty getting out of chair: Reports Difficulty using hands for taps, buttons, cutlery, and/or writing: Denies  Assessment and Plan: Visit Diagnoses: Seronegative rheumatoid arthritis ([place]) - RF-, CCP-, 14-3-3 eta negative, R wrist synovitis, erosive changes, intercarpal and radiocarpal joint space narrowing: She has been experiencing increased discomfort in the right hand and right wrist.  She has noticed decreased grip strength and difficulty opening jars/gas cap. Joint protection and muscle strengthening were discussed.  She is taking Imuran 75 mg po daily. She will continue taking imuran as prescribed.  She was advised to notify us if she develops increased joint pain or joint swelling.  She will follow up in 3-4 months.   High risk medication use - Imuran 75 mg po daily. Off PLQ due to Macular Degenration Concern. CBC and CMP were drawn on 10/14/18.  She is due to update lab work.  Standing orders were  placed today.  Scleroderma, limited ([place]): Hx sclerodactyly, Raynauds, arthralgias, +ANA, +Ro, +La, +RF   Interstitial lung disease ([place]) - CT ILD stable-10/19/17, PFT 11/10/17, Echo 04/16/17, Dr. Ramaswamy q67months. Future order  for chest CT in place.  She is not experiencing any new or worsening pulmonary symptoms.  TPMT intermediate metabolizer [facility]): She is on a reduced dose of Imuran-75 mg po daily.  Raynaud's disease without gangrene: She rarely experiences symptoms of Raynaud's.  Her symptoms are very mild.  No digital ulcerations or signs or gangrene.   Sicca syndrome: She has ongoing sicca symptoms.  Her symptoms have been manageable. She uses refresh drops during the night for symptomatic relief.  Primary osteoarthritis of both hands: She has been experiencing increased right hand pain and stiffness.   She has noticed decreased grip strength and difficulty opening and closing the gas cap.  Joint protection and muscle strengthening were discussed.   Primary osteoarthritis of both knees: She had a right knee arthroscopy on 01/14/19 by Dr. Berenice Primas and continues to go to PT.  She is not experiencing any left knee joint discomfort at this time.  S/P arthroscopy of right knee: She had a partial meniscectomy, chondroplasty, and loose body removal performed on 01/14/19 by Dr. Berenice Primas.  She continues to go to physical therapy.  History of repair of right rotator cuff: Doing well.  She has no discomfort at this time.  Other medical conditions are listed as follows:   History of gastroesophageal reflux (GERD): She has not had any symptoms of reflux recently.   History of colon cancer -according to patient she had cancer more than 10 years ago.  Follow Up Instructions: She will follow up in 3-4 months    I discussed the assessment and treatment plan with the patient. The patient was provided an opportunity to ask questions and all were answered. The patient agreed with the plan and demonstrated an understanding of the instructions.   The patient was advised to call back or seek an in-person evaluation if the symptoms worsen or if the condition fails to improve as anticipated.  I provided 25 minutes of  non-face-to-face time during this encounter. Bo Merino, MD   Scribed by-  Hazel Sams, PA-C

## 2019-02-16 ENCOUNTER — Other Ambulatory Visit: Payer: Self-pay | Admitting: *Deleted

## 2019-02-21 ENCOUNTER — Telehealth: Payer: Self-pay | Admitting: Rheumatology

## 2019-02-21 NOTE — Telephone Encounter (Signed)
Patient left a voicemail stating the [employer] [facility] left her multiple messages asking her to donate blood because her blood type is needed.  Patient is requesting a return call to get Dr. Arlean Hopping opinion on this.

## 2019-02-21 NOTE — Telephone Encounter (Signed)
Hgb was 13.6 on 01/14/19.  There should be no medical reason that she cannot donate.  Please recommend wearing a mask and practicing social distancing. Ok for the patient to donate blood if she would like to.

## 2019-02-22 NOTE — Telephone Encounter (Signed)
Advised patient of information below. She verbalized understanding.

## 2019-02-23 ENCOUNTER — Telehealth (INDEPENDENT_AMBULATORY_CARE_PROVIDER_SITE_OTHER): Payer: Medicare PPO | Admitting: Rheumatology

## 2019-02-23 ENCOUNTER — Other Ambulatory Visit: Payer: Self-pay

## 2019-02-23 ENCOUNTER — Encounter: Payer: Self-pay | Admitting: Rheumatology

## 2019-02-23 DIAGNOSIS — Z9889 Other specified postprocedural states: Secondary | ICD-10-CM

## 2019-02-23 DIAGNOSIS — R682 Dry mouth, unspecified: Secondary | ICD-10-CM

## 2019-02-23 DIAGNOSIS — J849 Interstitial pulmonary disease, unspecified: Secondary | ICD-10-CM | POA: Diagnosis not present

## 2019-02-23 DIAGNOSIS — Z79899 Other long term (current) drug therapy: Secondary | ICD-10-CM | POA: Diagnosis not present

## 2019-02-23 DIAGNOSIS — I7300 Raynaud's syndrome without gangrene: Secondary | ICD-10-CM

## 2019-02-23 DIAGNOSIS — M19041 Primary osteoarthritis, right hand: Secondary | ICD-10-CM

## 2019-02-23 DIAGNOSIS — M0600 Rheumatoid arthritis without rheumatoid factor, unspecified site: Secondary | ICD-10-CM

## 2019-02-23 DIAGNOSIS — M349 Systemic sclerosis, unspecified: Secondary | ICD-10-CM | POA: Diagnosis not present

## 2019-02-23 DIAGNOSIS — Z8719 Personal history of other diseases of the digestive system: Secondary | ICD-10-CM

## 2019-02-23 DIAGNOSIS — E8889 Other specified metabolic disorders: Secondary | ICD-10-CM

## 2019-02-23 DIAGNOSIS — Z85038 Personal history of other malignant neoplasm of large intestine: Secondary | ICD-10-CM

## 2019-02-23 DIAGNOSIS — M170 Bilateral primary osteoarthritis of knee: Secondary | ICD-10-CM

## 2019-02-23 DIAGNOSIS — M19042 Primary osteoarthritis, left hand: Secondary | ICD-10-CM

## 2019-02-25 ENCOUNTER — Other Ambulatory Visit: Payer: Self-pay | Admitting: *Deleted

## 2019-02-25 MED ORDER — AZATHIOPRINE 50 MG PO TABS: 75.0000 mg | ORAL_TABLET | Freq: Every day | ORAL | 0 refills | Status: DC

## 2019-02-25 NOTE — Telephone Encounter (Signed)
Refill request received via fax  Last Visit: 02/23/19 Next Visit: due April 2021 Labs:10/14/18 glucose is 106. Rest of CMP WNL. CBC WNL.   Patient advised she is due to update labs. Patient states she had her labs updated with PCP and will have them fax results.     Okay to refill  30 day supply per Dr. Estanislado Pandy

## 2019-03-08 ENCOUNTER — Ambulatory Visit: Payer: Medicare PPO

## 2019-03-18 ENCOUNTER — Ambulatory Visit: Payer: Medicare PPO | Attending: Internal Medicine

## 2019-03-18 DIAGNOSIS — Z23 Encounter for immunization: Secondary | ICD-10-CM

## 2019-03-18 NOTE — Progress Notes (Signed)
   Covid-19 Vaccination Clinic  Name:  Lindsay Herman    MRN: [id number] DOB: 1945/07/28  03/18/2019  Lindsay Herman was observed post Covid-19 immunization for 15 minutes without incidence. She was provided with Vaccine Information Sheet and instruction to access the V-Safe system.   Lindsay Herman was instructed to call 911 with any severe reactions post vaccine: Marland Kitchen Difficulty breathing  . Swelling of your face and throat  . A fast heartbeat  . A bad rash all over your body  . Dizziness and weakness    Immunizations Administered    Name Date Dose VIS Date Route   Pfizer COVID-19 Vaccine 03/18/2019 10:52 AM 0.3 mL 01/21/2019 Intramuscular   Manufacturer: [place]   Lot: [id number]   [place]: [id number]

## 2019-03-28 ENCOUNTER — Encounter: Payer: Self-pay | Admitting: Rheumatology

## 2019-03-29 ENCOUNTER — Ambulatory Visit: Payer: Medicare PPO

## 2019-04-06 ENCOUNTER — Telehealth: Payer: Self-pay | Admitting: Rheumatology

## 2019-04-06 NOTE — Telephone Encounter (Signed)
Patient called stating her bone density scan showed low density in her left hip and her OB/GYN recommended Fosamax.  Patient is requesting a return call for Dr. Arlean Hopping recommendations on the medication.

## 2019-04-06 NOTE — Telephone Encounter (Signed)
If she is tolerating the Fosamax then she should continue the medication.

## 2019-04-06 NOTE — Telephone Encounter (Signed)
Patient states she has seen her OBGYN has prescribed Fosamax due to the results of her bone density scan. Patient would like to know your recommendations as well. Patient will be bringing a copy of results to the office for review.

## 2019-04-08 NOTE — Telephone Encounter (Signed)
Yes, I would recommend calcium, vitamin D and resistive exercises.  She should have a repeat bone density in 2 years.

## 2019-04-08 NOTE — Telephone Encounter (Signed)
Patient advised per Dr. Estanislado Pandy she would recommend calcium, vitamin D and resistive exercises. She should have a repeat bone density in 2 years.

## 2019-04-08 NOTE — Telephone Encounter (Signed)
Received DEXA results from [facility].  Date of Scan: 03/28/19 Lowest T-score and site measured: -2.0 Left Femoral Neck  Significant changes in BMD and site measured (5% and above): n/a  Current Regimen:Calcium supplements and weight bearing exercises   Spoke with patient and she had an ankle fracture in 2011 but no other fractures.  Would you still only recommend Calcium and Vitamin D and repeating the DEXA in two years or go with the GY's recommendation of Fosamax?

## 2019-04-12 ENCOUNTER — Ambulatory Visit: Payer: Medicare PPO | Attending: Internal Medicine

## 2019-04-12 DIAGNOSIS — Z23 Encounter for immunization: Secondary | ICD-10-CM | POA: Insufficient documentation

## 2019-04-12 NOTE — Progress Notes (Signed)
   Covid-19 Vaccination Clinic  Name:  Lindsay Herman    MRN: [id number] DOB: [birth year]  04/12/2019  Lindsay Herman was observed post Covid-19 immunization for 15 minutes without incident. She was provided with Vaccine Information Sheet and instruction to access the V-Safe system.   Lindsay Herman was instructed to call 911 with any severe reactions post vaccine: Marland Kitchen Difficulty breathing  . Swelling of face and throat  . A fast heartbeat  . A bad rash all over body  . Dizziness and weakness   Immunizations Administered    Name Date Dose VIS Date Route   Pfizer COVID-19 Vaccine 04/12/2019 11:44 AM 0.3 mL 01/21/2019 Intramuscular   Manufacturer: [place]   Lot: [id number]   [place]: [id number]

## 2019-05-12 DIAGNOSIS — M545 Low back pain: Secondary | ICD-10-CM | POA: Diagnosis not present

## 2019-05-12 DIAGNOSIS — M25562 Pain in left knee: Secondary | ICD-10-CM | POA: Diagnosis not present

## 2019-05-20 NOTE — Progress Notes (Signed)
Office Visit Note  Patient: Lindsay Herman             Date of Birth: [birth year]           MRN: [id number]             PCP: Burnard Bunting, MD Referring: Burnard Bunting, MD Visit Date: 05/31/2019 Occupation: @GUAROCC @  Subjective:  Medication monitoring.   History of Present Illness: Lindsay Herman is a 74 y.o. female with history of limited scleroderma, interstitial lung disease and seronegative inflammatory arthritis.  She has been doing well on Imuran.  She denies any joint swelling.  She has some joint stiffness.  She states she has not had any increased shortness of breath.  She had an appointment with Dr. Chase Caller in September and has 1 coming up in July.  She has not noticed any progression of his skin tightness.  She has some sicca symptoms which are manageable.  Activities of Daily Living:  Patient reports morning stiffness for 0 none.   Patient Reports nocturnal pain.  Difficulty dressing/grooming: Denies Difficulty climbing stairs: Denies Difficulty getting out of chair: Denies Difficulty using hands for taps, buttons, cutlery, and/or writing: Denies  Review of Systems  Constitutional: Positive for fatigue. Negative for night sweats, weight gain and weight loss.  HENT: Negative for mouth sores, trouble swallowing, trouble swallowing, mouth dryness and nose dryness.   Eyes: Positive for dryness. Negative for pain, redness and visual disturbance.  Respiratory: Positive for shortness of breath. Negative for cough and difficulty breathing.   Cardiovascular: Negative for chest pain, palpitations, hypertension, irregular heartbeat and swelling in legs/feet.  Gastrointestinal: Negative for blood in stool, constipation and diarrhea.  Endocrine: Negative for increased urination.  Genitourinary: Negative for difficulty urinating and vaginal dryness.  Musculoskeletal: Negative for arthralgias, joint pain, joint swelling, myalgias, muscle weakness, morning stiffness, muscle  tenderness and myalgias.  Skin: Positive for color change. Negative for rash, hair loss, skin tightness, ulcers and sensitivity to sunlight.  Allergic/Immunologic: Negative for susceptible to infections.  Neurological: Negative for dizziness, numbness, memory loss, night sweats and weakness.  Hematological: Negative for bruising/bleeding tendency and swollen glands.  Psychiatric/Behavioral: Negative for depressed mood and sleep disturbance. The patient is not nervous/anxious.     PMFS History:  Patient Active Problem List   Diagnosis Date Noted  . Acute medial meniscal tear, left, initial encounter 01/14/2019  . Chondromalacia, left knee 01/14/2019  . Loose body of left knee 01/14/2019  . Scleroderma, limited ([place]) 02/10/2016  . History of environmental allergies 02/10/2016  . Gastroesophageal reflux disease 02/10/2016  . History of colon cancer 02/10/2016  . Sicca syndrome 02/01/2016  . History of repair of right rotator cuff 02/01/2016  . Primary osteoarthritis of both knees 02/01/2016  . Primary osteoarthritis of both hands 02/01/2016  . Raynaud's disease without gangrene 02/01/2016  . Cystocele   . Atrophic vaginitis   . Osteopenia   . Menopausal symptoms     Past Medical History:  Diagnosis Date  . Atherosclerosis    aorta and coronary artery  . Atrophic vaginitis   . Cancer [facility]) 1995   Colon cancer tx surgery and chemo  . Contact lens/glasses fitting    wears contacts or glasses  . Cystocele   . Nasal fracture   . Osteopenia 06/2017   T score -2.0 FRAX 13% / 2.8%  . PONV (postoperative nausea and vomiting)   . Raynaud's disease   . Rectocele   . Rosacea   . Scleroderma (  [place])   . Urinary frequency     Family History  Problem Relation Age of Onset  . Diabetes Mother   . Heart disease Mother   . Stroke Mother   . Diabetes Brother   . Hypertension Brother   . Lupus Father    Past Surgical History:  Procedure Laterality Date  . APPENDECTOMY    . colon  cancer  1995  . COLON RESECTION  1995  . KNEE ARTHROSCOPY Left 01/14/2019   Procedure: ARTHROSCOPY KNEE, PARTIAL MENISCECTOMY, CHONDROPLASTY AND LOOSE BODY REMOVAL;  Surgeon: Dorna Leitz, MD;  Location: [facility];  Service: Orthopedics;  Laterality: Left;  Marland Kitchen MASS EXCISION Left 08/31/2012   Procedure: EXCISION MUCOID CYST DEBRIDEMENT DISTAL INTERPHALANGEAL LEFT MIDDLE FINGER;  Surgeon: Wynonia Sours, MD;  Location: [facility];  Service: Orthopedics;  Laterality: Left;  . OOPHORECTOMY     LSO  . ROTATOR CUFF REPAIR Right   . TONSILLECTOMY    . VAGINAL HYSTERECTOMY  1994   Social History   Social History Narrative  . Not on file   Immunization History  Administered Date(s) Administered  . Influenza Split 12/12/2010  . Influenza, High Dose Seasonal PF 10/24/2017  . PFIZER SARS-COV-2 Vaccination 03/18/2019, 04/12/2019  . Pneumococcal Conjugate-13 07/24/2014  . Pneumococcal Polysaccharide-23 01/31/2009  . Zoster Recombinat (Shingrix) 11/27/2017, 02/12/2018     Objective: Vital Signs: BP 129/75 (BP Location: Left Arm, Patient Position: Sitting, Cuff Size: Normal)   Pulse 77   Resp 16   Ht 5' 6.5" (1.689 m)   Wt 164 lb (74.4 kg)   BMI 26.07 kg/m    Physical Exam Vitals and nursing note reviewed.  Constitutional:      Appearance: She is well-developed.  HENT:     Head: Normocephalic and atraumatic.  Eyes:     Conjunctiva/sclera: Conjunctivae normal.  Cardiovascular:     Rate and Rhythm: Normal rate and regular rhythm.     Heart sounds: Normal heart sounds.  Pulmonary:     Effort: Pulmonary effort is normal.     Breath sounds: Normal breath sounds.  Abdominal:     General: Bowel sounds are normal.     Palpations: Abdomen is soft.  Musculoskeletal:     Cervical back: Normal range of motion.  Lymphadenopathy:     Cervical: No cervical adenopathy.  Skin:    General: Skin is warm and dry.     Capillary Refill: Capillary refill takes less than  2 seconds.  Neurological:     Mental Status: She is alert and oriented to person, place, and time.  Psychiatric:        Behavior: Behavior normal.      Musculoskeletal Exam: C-spine and thoracic spine were in good range of motion.  Shoulder joints, elbow joints with good range of motion.  She has DIP and PIP thickening with no synovitis.  Hip joints, knee joints, ankles with good range of motion with no synovitis.  CDAI Exam: CDAI Score: - Patient Global: -; Provider Global: - Swollen: -; Tender: - Joint Exam 05/31/2019   No joint exam has been documented for this visit   There is currently no information documented on the homunculus. Go to the Rheumatology activity and complete the homunculus joint exam.  Investigation: No additional findings.  Imaging: No results found.  Recent Labs: Lab Results  Component Value Date   WBC 7.5 10/14/2018   HGB 13.6 01/14/2019   PLT 319 10/14/2018   NA 140 01/14/2019  K 4.1 01/14/2019   CL 104 01/14/2019   CO2 28 10/14/2018   GLUCOSE 99 01/14/2019   BUN 19 01/14/2019   CREATININE 0.80 01/14/2019   BILITOT 0.4 10/19/2018   ALKPHOS 64 10/19/2018   AST 21 12/27/2018   ALT 7 10/19/2018   PROT 6.6 10/19/2018   ALBUMIN 4.3 10/19/2018   CALCIUM 9.9 10/14/2018   GFRAA 73 10/14/2018   QFTBGOLDPLUS NEGATIVE 10/06/2017    Speciality Comments: Osteoporosis managed by Dr. Phineas Real.  Procedures:  No procedures performed Allergies: Codeine and Morphine and related   Assessment / Plan:     Visit Diagnoses: Seronegative rheumatoid arthritis ([place]) - RF-, CCP-, 14-3-3 eta negative, R wrist synovitis, erosive changes, intercarpal and radiocarpal joint space narrowing.  She had no synovitis on my examination today.  Her arthritis is well controlled with Imuran.  High risk medication use -  Imuran 75 mg po daily. Off PLQ due to Macular Degenration Concern.  - Plan: CBC with Differential/Platelet, COMPLETE METABOLIC PANEL WITH GFR, CBC with  Differential/Platelet, COMPLETE METABOLIC PANEL WITH GFR today and then every 3 months.  Scleroderma, limited (H[facility]) - Hx sclerodactyly, Raynauds, arthralgias, +ANA, +Ro, +La, +RF   Interstitial lung disease ([place]) -  CT ILD stable-10/19/17, PFT 11/10/17, Echo 04/16/17, Dr. Ramaswamy q5months. I have advised her to schedule an appointment with Dr. Chase Caller.  TPMT intermediate metabolizer (HCC)-need for monitoring labs on a regular basis was emphasized.  Raynaud's disease without gangrene-currently not very symptomatic.  Sicca syndrome-she has been using over-the-counter products.  Primary osteoarthritis of both hands-joint protection was discussed.  Primary osteoarthritis of both knees-patient states she acquired some recent injury to her knee joint but is getting better now.  History of repair of right rotator cuff  History of gastroesophageal reflux (GERD)  History of colon cancer  S/P arthroscopy of right knee  Orders: Orders Placed This Encounter  Procedures  . CBC with Differential/Platelet  . COMPLETE METABOLIC PANEL WITH GFR  . CBC with Differential/Platelet  . COMPLETE METABOLIC PANEL WITH GFR   No orders of the defined types were placed in this encounter.   .  Follow-Up Instructions: Return in about 5 months (around 10/31/2019) for Scleroderma, ILD.   Bo Merino, MD  Note - This record has been created using Editor, commissioning.  Chart creation errors have been sought, but may not always  have been located. Such creation errors do not reflect on  the standard of medical care.

## 2019-05-23 DIAGNOSIS — H35363 Drusen (degenerative) of macula, bilateral: Secondary | ICD-10-CM | POA: Diagnosis not present

## 2019-05-29 ENCOUNTER — Other Ambulatory Visit: Payer: Self-pay | Admitting: Rheumatology

## 2019-05-30 DIAGNOSIS — N8111 Cystocele, midline: Secondary | ICD-10-CM | POA: Diagnosis not present

## 2019-05-30 DIAGNOSIS — R350 Frequency of micturition: Secondary | ICD-10-CM | POA: Diagnosis not present

## 2019-05-30 DIAGNOSIS — R3915 Urgency of urination: Secondary | ICD-10-CM | POA: Diagnosis not present

## 2019-05-30 NOTE — Telephone Encounter (Signed)
ok 

## 2019-05-30 NOTE — Telephone Encounter (Signed)
Last Visit: 02/23/19 Next Visit: 05/31/19 Labs:9/3/20glucose is 106. Rest of CMP WNL. CBC WNL.  Patient due for an appointment tomorrow and will update labs at that appointment.   Okay to refill 30 day supply Imuran?

## 2019-05-31 ENCOUNTER — Ambulatory Visit: Payer: Medicare PPO | Admitting: Rheumatology

## 2019-05-31 ENCOUNTER — Encounter: Payer: Self-pay | Admitting: Rheumatology

## 2019-05-31 ENCOUNTER — Other Ambulatory Visit: Payer: Self-pay

## 2019-05-31 VITALS — BP 129/75 | HR 77 | Resp 16 | Ht 66.5 in | Wt 164.0 lb

## 2019-05-31 DIAGNOSIS — M19041 Primary osteoarthritis, right hand: Secondary | ICD-10-CM | POA: Diagnosis not present

## 2019-05-31 DIAGNOSIS — M0600 Rheumatoid arthritis without rheumatoid factor, unspecified site: Secondary | ICD-10-CM

## 2019-05-31 DIAGNOSIS — I7300 Raynaud's syndrome without gangrene: Secondary | ICD-10-CM

## 2019-05-31 DIAGNOSIS — Z9889 Other specified postprocedural states: Secondary | ICD-10-CM

## 2019-05-31 DIAGNOSIS — Z85038 Personal history of other malignant neoplasm of large intestine: Secondary | ICD-10-CM

## 2019-05-31 DIAGNOSIS — M170 Bilateral primary osteoarthritis of knee: Secondary | ICD-10-CM

## 2019-05-31 DIAGNOSIS — E8889 Other specified metabolic disorders: Secondary | ICD-10-CM

## 2019-05-31 DIAGNOSIS — Z8719 Personal history of other diseases of the digestive system: Secondary | ICD-10-CM

## 2019-05-31 DIAGNOSIS — Z79899 Other long term (current) drug therapy: Secondary | ICD-10-CM | POA: Diagnosis not present

## 2019-05-31 DIAGNOSIS — M349 Systemic sclerosis, unspecified: Secondary | ICD-10-CM | POA: Diagnosis not present

## 2019-05-31 DIAGNOSIS — M19042 Primary osteoarthritis, left hand: Secondary | ICD-10-CM

## 2019-05-31 DIAGNOSIS — R682 Dry mouth, unspecified: Secondary | ICD-10-CM

## 2019-05-31 DIAGNOSIS — J849 Interstitial pulmonary disease, unspecified: Secondary | ICD-10-CM | POA: Diagnosis not present

## 2019-05-31 LAB — CBC WITH DIFFERENTIAL/PLATELET
Absolute Monocytes: 684 cells/uL (ref 200–950)
Basophils Absolute: 29 cells/uL (ref 0–200)
Basophils Relative: 0.5 %
Eosinophils Absolute: 110 cells/uL (ref 15–500)
Eosinophils Relative: 1.9 %
HCT: 42 % (ref 35.0–45.0)
Hemoglobin: 13.7 g/dL (ref 11.7–15.5)
Lymphs Abs: 754 cells/uL — ABNORMAL LOW (ref 850–3900)
MCH: 31.8 pg (ref 27.0–33.0)
MCHC: 32.6 g/dL (ref 32.0–36.0)
MCV: 97.4 fL (ref 80.0–100.0)
MPV: 10.7 fL (ref 7.5–12.5)
Monocytes Relative: 11.8 %
Neutro Abs: 4222 cells/uL (ref 1500–7800)
Neutrophils Relative %: 72.8 %
Platelets: 219 10*3/uL (ref 140–400)
RBC: 4.31 10*6/uL (ref 3.80–5.10)
RDW: 12.9 % (ref 11.0–15.0)
Total Lymphocyte: 13 %
WBC: 5.8 10*3/uL (ref 3.8–10.8)

## 2019-05-31 LAB — COMPLETE METABOLIC PANEL WITH GFR
AG Ratio: 1.9 (calc) (ref 1.0–2.5)
ALT: 10 U/L (ref 6–29)
AST: 16 U/L (ref 10–35)
Albumin: 4.1 g/dL (ref 3.6–5.1)
Alkaline phosphatase (APISO): 62 U/L (ref 37–153)
BUN: 10 mg/dL (ref 7–25)
CO2: 28 mmol/L (ref 20–32)
Calcium: 9.4 mg/dL (ref 8.6–10.4)
Chloride: 105 mmol/L (ref 98–110)
Creat: 0.79 mg/dL (ref 0.60–0.93)
GFR, Est African American: 85 mL/min/{1.73_m2} (ref >=60)
GFR, Est Non African American: 74 mL/min/{1.73_m2} (ref >=60)
Globulin: 2.2 g/dL (calc) (ref 1.9–3.7)
Glucose, Bld: 91 mg/dL (ref 65–99)
Potassium: 4.3 mmol/L (ref 3.5–5.3)
Sodium: 141 mmol/L (ref 135–146)
Total Bilirubin: 0.4 mg/dL (ref 0.2–1.2)
Total Protein: 6.3 g/dL (ref 6.1–8.1)

## 2019-05-31 NOTE — Patient Instructions (Addendum)
Back Exercises The following exercises strengthen the muscles that help to support the trunk and back. They also help to keep the lower back flexible. Doing these exercises can help to prevent back pain or lessen existing pain.  If you have back pain or discomfort, try doing these exercises 2-3 times each day or as told by your health care provider.  As your pain improves, do them once each day, but increase the number of times that you repeat the steps for each exercise (do more repetitions).  To prevent the recurrence of back pain, continue to do these exercises once each day or as told by your health care provider. Do exercises exactly as told by your health care provider and adjust them as directed. It is normal to feel mild stretching, pulling, tightness, or discomfort as you do these exercises, but you should stop right away if you feel sudden pain or your pain gets worse. Exercises Single knee to chest Repeat these steps 3-5 times for each leg: 1. Lie on your back on a firm bed or the floor with your legs extended. 2. Bring one knee to your chest. Your other leg should stay extended and in contact with the floor. 3. Hold your knee in place by grabbing your knee or thigh with both hands and hold. 4. Pull on your knee until you feel a gentle stretch in your lower back or buttocks. 5. Hold the stretch for 10-30 seconds. 6. Slowly release and straighten your leg. Pelvic tilt Repeat these steps 5-10 times: 1. Lie on your back on a firm bed or the floor with your legs extended. 2. Bend your knees so they are pointing toward the ceiling and your feet are flat on the floor. 3. Tighten your lower abdominal muscles to press your lower back against the floor. This motion will tilt your pelvis so your tailbone points up toward the ceiling instead of pointing to your feet or the floor. 4. With gentle tension and even breathing, hold this position for 5-10 seconds. Cat-cow Repeat these steps  until your lower back becomes more flexible: 1. Get into a hands-and-knees position on a firm surface. Keep your hands under your shoulders, and keep your knees under your hips. You may place padding under your knees for comfort. 2. Let your head hang down toward your chest. Contract your abdominal muscles and point your tailbone toward the floor so your lower back becomes rounded like the back of a cat. 3. Hold this position for 5 seconds. 4. Slowly lift your head, let your abdominal muscles relax and point your tailbone up toward the ceiling so your back forms a sagging arch like the back of a cow. 5. Hold this position for 5 seconds.  Press-ups Repeat these steps 5-10 times: 1. Lie on your abdomen (face-down) on the floor. 2. Place your palms near your head, about shoulder-width apart. 3. Keeping your back as relaxed as possible and keeping your hips on the floor, slowly straighten your arms to raise the top half of your body and lift your shoulders. Do not use your back muscles to raise your upper torso. You may adjust the placement of your hands to make yourself more comfortable. 4. Hold this position for 5 seconds while you keep your back relaxed. 5. Slowly return to lying flat on the floor.  Bridges Repeat these steps 10 times: 1. Lie on your back on a firm surface. 2. Bend your knees so they are pointing toward the ceiling and  your feet are flat on the floor. Your arms should be flat at your sides, next to your body. 3. Tighten your buttocks muscles and lift your buttocks off the floor until your waist is at almost the same height as your knees. You should feel the muscles working in your buttocks and the back of your thighs. If you do not feel these muscles, slide your feet 1-2 inches farther away from your buttocks. 4. Hold this position for 3-5 seconds. 5. Slowly lower your hips to the starting position, and allow your buttocks muscles to relax completely. If this exercise is too  easy, try doing it with your arms crossed over your chest. Abdominal crunches Repeat these steps 5-10 times: 1. Lie on your back on a firm bed or the floor with your legs extended. 2. Bend your knees so they are pointing toward the ceiling and your feet are flat on the floor. 3. Cross your arms over your chest. 4. Tip your chin slightly toward your chest without bending your neck. 5. Tighten your abdominal muscles and slowly raise your trunk (torso) high enough to lift your shoulder blades a tiny bit off the floor. Avoid raising your torso higher than that because it can put too much stress on your low back and does not help to strengthen your abdominal muscles. 6. Slowly return to your starting position. Back lifts Repeat these steps 5-10 times: 1. Lie on your abdomen (face-down) with your arms at your sides, and rest your forehead on the floor. 2. Tighten the muscles in your legs and your buttocks. 3. Slowly lift your chest off the floor while you keep your hips pressed to the floor. Keep the back of your head in line with the curve in your back. Your eyes should be looking at the floor. 4. Hold this position for 3-5 seconds. 5. Slowly return to your starting position. Contact a health care provider if:  Your back pain or discomfort gets much worse when you do an exercise.  Your worsening back pain or discomfort does not lessen within 2 hours after you exercise. If you have any of these problems, stop doing these exercises right away. Do not do them again unless your health care provider says that you can. Get help right away if:  You develop sudden, severe back pain. If this happens, stop doing the exercises right away. Do not do them again unless your health care provider says that you can. This information is not intended to replace advice given to you by your health care provider. Make sure you discuss any questions you have with your health care provider. Document Revised: 06/03/2018  Document Reviewed: 10/29/2017 Elsevier Patient Education  [place] We placed an order today for your standing lab work.    Please come back and get your standing labs in July and every 3 months We have open lab daily Monday through Thursday from 8:30-12:30 PM and 1:30-4:30 PM and Friday from 8:30-12:30 PM and 1:30-4:00 PM at the office of Dr. Bo Merino.   You may experience shorter wait times on Monday and Friday afternoons. The office is located at [address], [place], [place], [place] [address] No appointment is necessary.   Labs are drawn by [facility].  You may receive a bill from [facility] for your lab work.  If you wish to have your labs drawn at another location, please call the office 24 hours in advance to send orders.  If you have  any questions regarding directions or hours of operation,  please call [phone].   Just as a reminder please drink plenty of water prior to coming for your lab work. Thanks!

## 2019-06-01 NOTE — Progress Notes (Signed)
Lymphocyte count is low due to Imuran use.  We will continue to monitor.  CMP is normal.

## 2019-06-17 DIAGNOSIS — L570 Actinic keratosis: Secondary | ICD-10-CM | POA: Diagnosis not present

## 2019-06-17 DIAGNOSIS — D2272 Melanocytic nevi of left lower limb, including hip: Secondary | ICD-10-CM | POA: Diagnosis not present

## 2019-06-17 DIAGNOSIS — L821 Other seborrheic keratosis: Secondary | ICD-10-CM | POA: Diagnosis not present

## 2019-06-17 DIAGNOSIS — D2262 Melanocytic nevi of left upper limb, including shoulder: Secondary | ICD-10-CM | POA: Diagnosis not present

## 2019-06-17 DIAGNOSIS — Z85828 Personal history of other malignant neoplasm of skin: Secondary | ICD-10-CM | POA: Diagnosis not present

## 2019-06-17 DIAGNOSIS — L218 Other seborrheic dermatitis: Secondary | ICD-10-CM | POA: Diagnosis not present

## 2019-06-17 DIAGNOSIS — D1801 Hemangioma of skin and subcutaneous tissue: Secondary | ICD-10-CM | POA: Diagnosis not present

## 2019-06-21 DIAGNOSIS — M545 Low back pain: Secondary | ICD-10-CM | POA: Diagnosis not present

## 2019-06-21 DIAGNOSIS — M533 Sacrococcygeal disorders, not elsewhere classified: Secondary | ICD-10-CM | POA: Diagnosis not present

## 2019-06-28 ENCOUNTER — Other Ambulatory Visit: Payer: Self-pay | Admitting: Rheumatology

## 2019-06-28 NOTE — Telephone Encounter (Signed)
Last Visit: 05/31/2019 Next Visit: 11/03/2019 Labs: 05/31/2019 Lymphocyte count is low due to Imuran use. We will continue to monitor. CMP is normal.  Per office note on 05/31/2019, Imuran 75 mg po daily.   Okay to refill per Dr. Estanislado Pandy.

## 2019-07-04 ENCOUNTER — Other Ambulatory Visit: Payer: Self-pay | Admitting: Internal Medicine

## 2019-08-10 ENCOUNTER — Other Ambulatory Visit: Payer: Self-pay

## 2019-08-10 DIAGNOSIS — Z79899 Other long term (current) drug therapy: Secondary | ICD-10-CM

## 2019-08-11 LAB — COMPLETE METABOLIC PANEL WITH GFR
AG Ratio: 1.8 (calc) (ref 1.0–2.5)
ALT: 9 U/L (ref 6–29)
AST: 14 U/L (ref 10–35)
Albumin: 4.2 g/dL (ref 3.6–5.1)
Alkaline phosphatase (APISO): 64 U/L (ref 37–153)
BUN: 13 mg/dL (ref 7–25)
CO2: 28 mmol/L (ref 20–32)
Calcium: 9.9 mg/dL (ref 8.6–10.4)
Chloride: 105 mmol/L (ref 98–110)
Creat: 0.73 mg/dL (ref 0.60–0.93)
GFR, Est African American: 94 mL/min/{1.73_m2} (ref >=60)
GFR, Est Non African American: 81 mL/min/{1.73_m2} (ref >=60)
Globulin: 2.4 g/dL (calc) (ref 1.9–3.7)
Glucose, Bld: 85 mg/dL (ref 65–99)
Potassium: 4.6 mmol/L (ref 3.5–5.3)
Sodium: 141 mmol/L (ref 135–146)
Total Bilirubin: 0.4 mg/dL (ref 0.2–1.2)
Total Protein: 6.6 g/dL (ref 6.1–8.1)

## 2019-08-11 LAB — CBC WITH DIFFERENTIAL/PLATELET
Absolute Monocytes: 687 cells/uL (ref 200–950)
Basophils Absolute: 32 cells/uL (ref 0–200)
Basophils Relative: 0.5 %
Eosinophils Absolute: 63 cells/uL (ref 15–500)
Eosinophils Relative: 1 %
HCT: 41.1 % (ref 35.0–45.0)
Hemoglobin: 13.3 g/dL (ref 11.7–15.5)
Lymphs Abs: 617 cells/uL — ABNORMAL LOW (ref 850–3900)
MCH: 31.5 pg (ref 27.0–33.0)
MCHC: 32.4 g/dL (ref 32.0–36.0)
MCV: 97.4 fL (ref 80.0–100.0)
MPV: 10.8 fL (ref 7.5–12.5)
Monocytes Relative: 10.9 %
Neutro Abs: 4901 cells/uL (ref 1500–7800)
Neutrophils Relative %: 77.8 %
Platelets: 279 10*3/uL (ref 140–400)
RBC: 4.22 10*6/uL (ref 3.80–5.10)
RDW: 13.8 % (ref 11.0–15.0)
Total Lymphocyte: 9.8 %
WBC: 6.3 10*3/uL (ref 3.8–10.8)

## 2019-08-11 NOTE — Progress Notes (Signed)
CBC and CMP normal

## 2019-09-01 ENCOUNTER — Ambulatory Visit
Admission: RE | Admit: 2019-09-01 | Discharge: 2019-09-01 | Disposition: A | Discharge status: Home / Self Care | Payer: Medicare PPO | Source: Ambulatory Visit | Attending: Internal Medicine | Admitting: Internal Medicine

## 2019-09-01 DIAGNOSIS — J984 Other disorders of lung: Secondary | ICD-10-CM | POA: Diagnosis not present

## 2019-09-01 DIAGNOSIS — M349 Systemic sclerosis, unspecified: Secondary | ICD-10-CM

## 2019-09-01 DIAGNOSIS — J84112 Idiopathic pulmonary fibrosis: Secondary | ICD-10-CM | POA: Diagnosis not present

## 2019-09-01 DIAGNOSIS — J849 Interstitial pulmonary disease, unspecified: Secondary | ICD-10-CM

## 2019-09-01 DIAGNOSIS — J479 Bronchiectasis, uncomplicated: Secondary | ICD-10-CM | POA: Diagnosis not present

## 2019-09-02 NOTE — Progress Notes (Signed)
CT with ILD but without change since 2019. Will discuss in [place] followu

## 2019-09-08 ENCOUNTER — Other Ambulatory Visit: Payer: Self-pay

## 2019-09-08 ENCOUNTER — Encounter: Payer: Self-pay | Admitting: Internal Medicine

## 2019-09-08 ENCOUNTER — Ambulatory Visit: Payer: Medicare PPO | Admitting: Internal Medicine

## 2019-09-08 VITALS — BP 118/68 | HR 87 | Ht 66.5 in | Wt 164.8 lb

## 2019-09-08 DIAGNOSIS — J849 Interstitial pulmonary disease, unspecified: Secondary | ICD-10-CM | POA: Diagnosis not present

## 2019-09-08 DIAGNOSIS — M349 Systemic sclerosis, unspecified: Secondary | ICD-10-CM

## 2019-09-08 NOTE — Patient Instructions (Addendum)
ICD-10-CM   1. ILD (interstitial lung disease) ([place])  J84.9   2. Scleroderma ([place])  M34.9      You have interstitial lung disease secondary to scleroderma. The good news is that it is mild and is also stable between March 2018 ->  October 2019 -> Sept 2020 ->July 2021 However as explained this can become unpredictably progressive especially over time   Plan -At this time I recommend you continue Imuran that was prescribed by Dr. Keturah Barre for your joint pain - I do not recommend adding anti-fibrotics at this time given stability of disease and GI side effects with this drug in context of prior colectomy - If you have intolerance to Imuran or this potential for progression then we can add nintedanib   -We will continue to monitor your situation with serial clinical visits lung function and flexible approach to CT scan --At this point in time you  did not need oxygen therapy -Continue to keep yourself fit -follow all respiratory vaccine protoclls  - Can skip PFT test 09/08/2019   Follow-up -In 9-12  months do spirometry and DLCO test -Return to ILD clinic with Dr Chase Caller after above in 9-12 months but after above tests

## 2019-09-08 NOTE — Progress Notes (Signed)
OV 11/10/2017  Background history followed by Dr. Estanislado Pandy - Scleroderma, limited ([place]) - Hx sclerodactyly, Raynauds, arthralgias, +ANA, +Ro, +La, +RF ; Off PLQ due to Macular Degenration Concern. - Raynaud's disease without gangrene-it is not very symptomatic currently. - Sicca syndrome-Lindsay Herman continues to have dry mouth and dry eyes.  The symptoms are tolerable. - Primary osteoarthritis of both hands-Lindsay Herman has some changes consistent with osteoarthritis. - Antibiodies - 10/06/17   - neg CCP and RA   Current history -74 year old lady with the above medical problems.  Known to have scleroderma for at least 10 years according to history.  Lindsay Herman tells me that Lindsay Herman has had insidious onset of shortness of breath for heavy exertion after doing long walks for a few to several years.  It is stable and has not changed.  Lindsay Herman thought this was because of aging.  Lindsay Herman does not have scleroderma.  Lindsay Herman had pulmonary function test in the past but Lindsay Herman does not know the result.  Looking at it it shows reduction in diffusion capacity.  Most recently based on the history and review of rheumatology charts it appears that Lindsay Herman started having arthritis in her forearm.  This was in August 2019.  Rheumatoid arthritis work-up was negative on serology.  However because the arthritis was felt to be inflammatory methotrexate was considered.  Because of her previous abnormal pulmonary function test high-resolution CT chest was obtained and it showed probable UIP pattern ILD.  I personally visualized the CT chest and agree with those findings.  Repeat pulmonary function test showed stability.  Therefore Lindsay Herman has been referred here.  In the phone conversation with Dr. Keturah Barre I was okay with patient starting Imuran against both arthritis and ILD.  At this point that Lindsay Herman is feeling stable.  We asked her to do interstitial lung disease Herman.  We sent this to her to her house but Lindsay Herman forgot to bring it.  Walking desaturation test was  normal without any desaturations.    Lindsay Herman  -Symptoms: Lindsay Herman has level 3 dyspnea for walking up stairs or walking up a hill but otherwise does not have dyspnea.  Lindsay Herman does not cough does not have a  Past medical history: Lindsay Herman is occult positive for rheumatoid arthritis for a few weeks.  Lindsay Herman Lindsay Herman circled positive for scleroderma limited since 2010 but otherwise negative  Review of systems: Positive for fatigue at times for few years.  Arthralgia for several years.  Lindsay Herman has had Raynud for several years.  Lindsay Herman says after Lindsay Herman retired and not having to rush to school and not being exposed to the outdoor air in the winter it is actually much better and not noticeable.  Lindsay Herman does have some snoring but it is controlled with Afrin nasal spray.  Personal exposure history: Started smoking 1968 quit in 1974.  Smoked 8 cigarettes a day.  Lindsay Herman has lived with a smoker but denies any use of cigars or marijuana cocaine intravenous drug use of vaping products.  Home exposure history: Single-family home suburban 33 years in the same home age of the home of 86 years  Occupational history: 122 questionnaires positive first living in a condition spaces but otherwise negative.  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 , MRN: [id number] ,  ADDRESS: [place] [address]   10/27/2018 -   Chief Complaint  Patient presents with  . ILD (interstitial lung disease)    Discuss results of PFT     ICD-10-CM   1. ILD (interstitial lung disease) ([place])  J84.9   2. Scleroderma ([place])  M34.9      HPI Lindsay Herman 74 y.o. -presents for follow-up of interstitial lung disease secondary to scleroderma.  Since her last visit approximately a year ago Lindsay Herman continues to be stable.  Symptom  scores are mild.  Lindsay Herman is on Imuran through Dr Bo Merino.  Lindsay Herman reports no new complaints.  Lindsay Herman is social distancing quite well.  Lindsay Herman had questions about monitoring of her disease and future therapy.  Lindsay Herman had pulmonary function test today that shows continued stability.  Her current symptom scores are mild.    OV 09/08/2019   Subjective:  Patient ID: Lindsay Herman, female , DOB: Apr 08, 1945, age 74 y.o. years. , MRN: [id number],  ADDRESS: [place] [address] PCP  Burnard Bunting, MD Providers : Treatment Team:  Attending Provider: Brand Males, MD   Chief Complaint  Patient presents with  . Follow-up    Pt states Lindsay Herman has been doing good since last visit and states Lindsay Herman feels like Lindsay Herman has been stable. Pt denies any complaints with her breathing.       HPI Lindsay Herman 74 y.o. -follow-up for interstitial lung disease secondary to scleroderma on Imuran.  Lindsay Herman has mild stable interstitial lung disease.  Last seen in September 2020.  Since then Lindsay Herman continues to be stable.  Symptom scores are listed below and extremely mild.  Infectious somewhat better than before.  Lindsay Herman continues on Imuran.  Because of stability we discussed and Lindsay Herman would not take antifibrotic's.  Also Lindsay Herman has a colectomy.  Lindsay Herman tells me that Lindsay Herman who is a patient of mine now disease from scleroderma ILD was a neighboring) for 5 years.  Lindsay Herman is thankful for my care to her.  Lindsay Herman does not have any chest pain.  Lindsay Herman had a high-resolution CT chest and this documents continued stability of her probably UIP pattern of ILD.  Lindsay Herman has scheduled PFT test today but given stability of her CT and symptoms wondering if we can skip it   SYMPTOM SCALE - ILD 10/27/2018  09/08/2019   O2 use ra ra  Shortness of Breath symptoms 0 -> 5 scale with 5 being worst (score 6 If unable to do) 0  At rest 0 0  Simple tasks - showers, clothes change, eating, shaving 0 0  Household (dishes, doing bed, laundry) 1 0   Shopping 1 0  Walking level at own pace 1 0  Walking up Stairs 3 1  Total (40 - 48) Dyspnea Score 6 1  How bad is your cough? 0 0  How bad is your fatigue 3 1  nausea  0  vomit  0  diarrhea  0  anzity  0  depression  0     Simple office walk 185 feet x  3 laps goal with forehead probe 09/08/2019 Done with mask  O2 used ra  Number laps completed 3  Comments about pace moderate  Resting Pulse Ox/HR 96% and 91/min  Final Pulse Ox/HR 93% and 116/min  Desaturated </= 88% no  Desaturated <= 3% points yes  Got Tachycardic >/= 90/min yes  Symptoms at end of test Moderate dyspnea due to mask  Miscellaneous  comments x        Results for Lindsay, Herman (MRN [id number]) as of 10/27/2018 09:56  Ref. Range 04/16/2016 12:44 11/10/2017 16:02 10/27/2018 08:53  FVC-Pre Latest Units: L 3.14 3.22 3.28  FVC-%Pred-Pre Latest Units: % 94 98 100   Results for Lindsay, Herman (MRN [id number]) as of 10/27/2018 09:56  Ref. Range 04/16/2016 12:44 10   /02/2017 16:02 10/27/2018 08:53  DLCO unc Latest Units: ml/min/mmHg 20.08 18.84 19.71  DLCO unc % pred Latest Units: % 70 66 92   ROS - per HPI  PFT Results Latest Ref Rng & Units 10/27/2018 11/10/2017 04/16/2016  FVC-Pre L 3.28 3.22 3.14  FVC-Predicted Pre % 100 98 94  FVC-Post L - 3.23 3.14  FVC-Predicted Post % - 98 94  Pre FEV1/FVC % % 88 90 91  Post FEV1/FCV % % - 88 93  FEV1-Pre L 2.89 2.89 2.87  FEV1-Predicted Pre % 117 117 113  FEV1-Post L - 2.85 2.92  DLCO UNC% % 92 66 70  DLCO COR %Predicted % 120 81 79  TLC L - 5.04 5.41  TLC % Predicted % - 91 98  RV % Predicted % - 82 99    IMPRESSION: 1. There is mild, bibasilar predominant pulmonary fibrosis in a pattern featuring mild tubular bronchiectasis, irregular peripheral interstitial and ground-glass opacity and subtle areas of bronchiolectasis in the lung bases. Fibrotic findings are not significantly changed compared to prior examination dated 10/19/2017. Findings remain most  consistent with a "probable UIP" pattern of fibrosis by ATS pulmonary fibrosis criteria and are potentially consistent with a connective tissue disorder related fibrosis given reported history of scleroderma. Findings are categorized as probable UIP per consensus guidelines: Diagnosis of Idiopathic Pulmonary Fibrosis: An Official ATS/ERS/JRS/ALAT Clinical Practice Guideline. [place], Iss 5, [phone], Oct 11 2016. 2. No CT abnormality of the esophagus given reported history of scleroderma. 3. Cholelithiasis.   Electronically Signed   By: Lindsay Herman M.D.   On: 09/01/2019 13:56  ROS - per HPI     has a past medical history of Atherosclerosis, Atrophic vaginitis, Cancer ([place]) (1995), Contact lens/glasses fitting, Cystocele, Nasal fracture, Osteopenia (06/2017), PONV (postoperative nausea and vomiting), Raynaud's disease, Rectocele, Rosacea, Scleroderma ([place] [facility]), and Urinary frequency.   reports that Lindsay Herman quit smoking about 47 years ago. Her smoking use included cigarettes. Lindsay Herman quit after 3.00 years of use. Lindsay Herman has never used smokeless tobacco.  Past Surgical History:  Procedure Laterality Date  . APPENDECTOMY    . colon cancer  1995  . COLON RESECTION  1995  . KNEE ARTHROSCOPY Left 01/14/2019   Procedure: ARTHROSCOPY KNEE, PARTIAL MENISCECTOMY, CHONDROPLASTY AND LOOSE BODY REMOVAL;  Surgeon: Dorna Leitz, MD;  Location: [facility];  Service: Orthopedics;  Laterality: Left;  Marland Kitchen MASS EXCISION Left 08/31/2012   Procedure: EXCISION MUCOID CYST DEBRIDEMENT DISTAL INTERPHALANGEAL LEFT MIDDLE FINGER;  Surgeon: Wynonia Sours, MD;  Location: [facility];  Service: Orthopedics;  Laterality: Left;  . OOPHORECTOMY     LSO  . ROTATOR CUFF REPAIR Right   . TONSILLECTOMY    . VAGINAL HYSTERECTOMY  1994    Allergies  Allergen Reactions  . Codeine Nausea And Vomiting  . Morphine And Related Nausea And Vomiting    Immunization History   Administered Date(s) Administered  . Influenza Split 12/12/2010  . Influenza, High Dose Seasonal PF 10/24/2017, 11/08/2018  . PFIZER SARS-COV-2 Vaccination 03/18/2019, 04/12/2019  . Pneumococcal Conjugate-13 07/24/2014  .  Pneumococcal Polysaccharide-23 01/31/2009  . Zoster Recombinat (Shingrix) 11/27/2017, 02/12/2018    Family History  Problem Relation Age of Onset  . Diabetes Mother   . Heart disease Mother   . Stroke Mother   . Diabetes Brother   . Hypertension Brother   . Lupus Father      Current Outpatient Medications:  .  azaTHIOprine (IMURAN) 50 MG tablet, TAKE 1.5 TABLETS BY MOUTH EVERY DAY, Disp: 135 tablet, Rfl: 0 .  b complex vitamins capsule, Take 1 capsule by mouth daily., Disp: , Rfl:  .  Coenzyme Q10 (COQ10) 200 MG CAPS, Take by mouth daily., Disp: , Rfl:  .  levothyroxine (SYNTHROID) 50 MCG tablet, Take 1 tablet by mouth every evening. , Disp: , Rfl:  .  MAGNESIUM CITRATE PO, Take 250 mg by mouth daily., Disp: , Rfl:  .  Menaquinone-7 (VITAMIN K2 PO), Take 1 tablet by mouth daily., Disp: , Rfl:  .  Multiple Vitamins-Minerals (PRESERVISION AREDS 2) CAPS, Take 1 capsule by mouth 2 (two) times daily., Disp: , Rfl:  .  MYRBETRIQ 50 MG TB24 tablet, , Disp: , Rfl:  .  rosuvastatin (CRESTOR) 20 MG tablet, Take 1 tablet (20 mg total) by mouth daily., Disp: 90 tablet, Rfl: 3 .  TURMERIC PO, Take by mouth as directed., Disp: , Rfl:  .  Vitamin D, Cholecalciferol, 25 MCG (1000 UT) CAPS, Take 1,000 Units by mouth daily., Disp: , Rfl:       Objective:   Vitals:   09/08/19 0858  BP: 118/68  Pulse: 87  SpO2: 98%  Weight: 164 lb 12.8 oz (74.8 kg)  Height: 5' 6.5" (1.689 m)    Estimated body mass index is 26.2 kg/m as calculated from the following:   Height as of this encounter: 5' 6.5" (1.689 m).   Weight as of this encounter: 164 lb 12.8 oz (74.8 kg).  @WEIGHTCHANGE @  [employer]   09/08/19 0858  Weight: 164 lb 12.8 oz (74.8 kg)     Physical  Exam  General Appearance:    Alert, cooperative, no distress, appears stated age - yes , Deconditioned looking - no , OBESE  - no, Sitting on Wheelchair -  no  Head:    Normocephalic, without obvious abnormality, atraumatic  Eyes:    PERRL, conjunctiva/corneas clear,  Ears:    Normal TM's and external ear canals, both ears  Nose:   Nares normal, septum midline, mucosa normal, no drainage    or sinus tenderness. OXYGEN ON  - no . Patient is @ ra   Throat:   Lips, mucosa, and tongue normal; teeth and gums normal. Cyanosis on lips - no  Neck:   Supple, symmetrical, trachea midline, no adenopathy;    thyroid:  no enlargement/tenderness/nodules; no carotid   bruit or JVD  Back:     Symmetric, no curvature, ROM normal, no CVA tenderness  Lungs:     Distress - no , Wheeze no, Barrell Chest - no, Purse lip breathing - no, Crackles - no or verymild at base   Chest Wall:    No tenderness or deformity.    Heart:    Regular rate and rhythm, S1 and S2 normal, no rub   or gallop, Murmur - no  Breast Exam:    NOT DONE  Abdomen:     Soft, non-tender, bowel sounds active all four quadrants,    no masses, no organomegaly. Visceral obesity - no  Genitalia:   NOT DONE  Rectal:  NOT DONE  Extremities:   Extremities - normal, Has Cane - no, Clubbing - no, Edema - no  Pulses:   2+ and symmetric all extremities  Skin:   Stigmata of Connective Tissue Disease - mild sclerpder,a  Lymph nodes:   Cervical, supraclavicular, and axillary nodes normal  Psychiatric:  Neurologic:   Pleasant - yes, Anxious - no, Flat affect - no  CAm-ICU - neg, Alert and Oriented x 3 - yes, Moves all 4s - yes, Speech - normal, Cognition - intact           Assessment:       ICD-10-CM   1. ILD (interstitial lung disease) ([place])  J84.9   2. Scleroderma ([place])  M34.9        Plan:     Patient Instructions     ICD-10-CM   1. ILD (interstitial lung disease) ([place])  J84.9   2. Scleroderma ([place])  M34.9      You have  interstitial lung disease secondary to scleroderma. The good news is that it is mild and is also stable between March 2018 ->  October 2019 -> Sept 2020 ->July 2021 However as explained this can become unpredictably progressive especially over time   Plan -At this time I recommend you continue Imuran that was prescribed by Dr. Keturah Barre for your joint pain - I do not recommend adding anti-fibrotics at this time given stability of disease and GI side effects with this drug in context of prior colectomy - If you have intolerance to Imuran or this potential for progression then we can add nintedanib   -We will continue to monitor your situation with serial clinical visits lung function and flexible approach to CT scan --At this point in time you  did not need oxygen therapy -Continue to keep yourself fit -follow all respiratory vaccine protoclls  - Can skip PFT test 09/08/2019   Follow-up -In 9-12  months do spirometry and DLCO test -Return to ILD clinic with Dr Chase Caller after above in 9-12 months but after above tests   (Level 04: Estb 30-39 min  visit type: on-site physical face to visit visit spent in total care time and counseling or/and coordination of care by this undersigned MD - Dr Lindsay Herman. This includes one or more of the following on this same day 09/08/2019: pre-charting, chart review, note writing, documentation discussion of test results, diagnostic or treatment recommendations, prognosis, risks and benefits of management options, instructions, education, compliance or risk-factor reduction. It excludes time spent by the [facility] or office staff in the care of the patient . Actual time is 30 min)     SIGNATURE    Dr. Brand Herman, M.D., F.C.C.P,  Pulmonary and Critical Care Medicine Staff Physician, [facility] Director - Interstitial Lung Disease  Program  Pulmonary [facility] at [place], [place],  [address]  Pager: [phone], If no answer or between  15:00h - 7:00h: call [phone] Telephone: [phone]  9:19 AM 09/08/2019

## 2019-09-19 ENCOUNTER — Telehealth: Payer: Self-pay | Admitting: Rheumatology

## 2019-09-19 NOTE — Telephone Encounter (Signed)
Patient request a call back in reference to the booster shot. Patient would like to know if she needs a rx for it, and where does she locate [employer] booster? Please call to discuss.

## 2019-09-19 NOTE — Telephone Encounter (Signed)
I called patient, per Dr. Estanislado Pandy the booster is not available at this time.

## 2019-09-24 ENCOUNTER — Other Ambulatory Visit: Payer: Self-pay | Admitting: Rheumatology

## 2019-09-26 NOTE — Telephone Encounter (Signed)
Last Visit: 05/31/2019 Next Visit: 11/03/2019 Labs: 08/10/2019 CBC and CMP normal  Current Dose per office note on 05/31/2019, Imuran 75 mg po daily.   Okay to refill per Dr. Estanislado Pandy.

## 2019-09-27 ENCOUNTER — Telehealth: Payer: Self-pay | Admitting: Rheumatology

## 2019-09-27 NOTE — Telephone Encounter (Signed)
Patient called requesting a return call with Dr. Arlean Hopping recommendation on the COVID booster vaccine.

## 2019-09-27 NOTE — Telephone Encounter (Signed)
Reviewed the following with patient.   COVID-19 vaccine recommendations:   COVID-19 vaccine is recommended for everyone (unless you are allergic to a vaccine component), even if you are on a medication that suppresses your immune system.   If you are on Methotrexate, Cellcept (mycophenolate), Rinvoq, Morrie Sheldon, and Olumiant- hold the medication for 1 week after each vaccine. Hold Methotrexate for 2 weeks after the single dose COVID-19 vaccine.   If you are on Orencia subcutaneous injection - hold medication one week prior to and one week after the first COVID-19 vaccine dose (only).   If you are on Orencia IV infusions- time vaccination administration so that the first COVID-19 vaccination will occur four weeks after the infusion and postpone the subsequent infusion by one week.   If you are on Cyclophosphamide or Rituxan infusions please contact your doctor prior to receiving the COVID-19 vaccine.   Do not take Tylenol or anti-inflammatory medications (NSAIDs) 24 hours prior to the COVID-19 vaccination.   There is no direct evidence about the efficacy of the COVID-19 vaccine in individuals who are on medications that suppress the immune system.   Even if you are fully vaccinated, and you are on any medications that suppress your immune system, please continue to wear a mask, maintain at least six feet social distance and practice hand hygiene.   If you develop a COVID-19 infection, please contact your PCP or our office to determine if you need antibody infusion.  The booster vaccine is now available for immunocompromised patients. It is advised that if you had Pfizer vaccine you should get [employer] booster.  If you had a Moderna vaccine then you should get a Moderna booster. Johnson and Wynetta Emery does not have a booster vaccine at this time.  Please see the following web sites for updated information.    https://[email or web address].org/[email or web address]/0/Files/COVID-19-Vaccination-Patient-Resources.[email or web address]  [email or web address]://[email or web address]/About-Us/Newsroom/Press-Releases/ID/1159

## 2019-10-04 DIAGNOSIS — E785 Hyperlipidemia, unspecified: Secondary | ICD-10-CM | POA: Diagnosis not present

## 2019-10-04 DIAGNOSIS — E039 Hypothyroidism, unspecified: Secondary | ICD-10-CM | POA: Diagnosis not present

## 2019-10-10 DIAGNOSIS — R5383 Other fatigue: Secondary | ICD-10-CM | POA: Diagnosis not present

## 2019-10-10 DIAGNOSIS — D649 Anemia, unspecified: Secondary | ICD-10-CM | POA: Diagnosis not present

## 2019-10-10 DIAGNOSIS — R82998 Other abnormal findings in urine: Secondary | ICD-10-CM | POA: Diagnosis not present

## 2019-10-10 DIAGNOSIS — I700 Atherosclerosis of aorta: Secondary | ICD-10-CM | POA: Diagnosis not present

## 2019-10-10 DIAGNOSIS — M1990 Unspecified osteoarthritis, unspecified site: Secondary | ICD-10-CM | POA: Diagnosis not present

## 2019-10-10 DIAGNOSIS — E039 Hypothyroidism, unspecified: Secondary | ICD-10-CM | POA: Diagnosis not present

## 2019-10-10 DIAGNOSIS — Z0000 Encounter for general adult medical examination without abnormal findings: Secondary | ICD-10-CM | POA: Diagnosis not present

## 2019-10-10 DIAGNOSIS — J849 Interstitial pulmonary disease, unspecified: Secondary | ICD-10-CM | POA: Diagnosis not present

## 2019-10-10 DIAGNOSIS — E785 Hyperlipidemia, unspecified: Secondary | ICD-10-CM | POA: Diagnosis not present

## 2019-10-10 DIAGNOSIS — Z1212 Encounter for screening for malignant neoplasm of rectum: Secondary | ICD-10-CM | POA: Diagnosis not present

## 2019-10-10 DIAGNOSIS — I10 Essential (primary) hypertension: Secondary | ICD-10-CM | POA: Diagnosis not present

## 2019-10-20 NOTE — Progress Notes (Signed)
Cardiology Office Note   Date:  10/21/2019   ID:  Lindsay Herman, DOB 14-Apr-1945, MRN [id number]  PCP:  Burnard Bunting, MD  Cardiologist:   Dorris Carnes, MD   F/U of coronary calcifications      History of Present Illness: Lindsay Herman is a 74 y.o. 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   Felt mild   Rx Imuran   Plan for spirometry f/u and f/u in clinic    The pt has atherosclerosis of aorta and coronary arteries as seen on CT    I saw the pt back in NOvember 2020  She is also followed in pulmonary and rheum cliinic    The ptt says she just feels tired   Has not been exercising  regularlly  Saw PCP and he recommended this to her She denies CP  No signif SOB      Current Meds  Medication Sig  . azaTHIOprine (IMURAN) 50 MG tablet TAKE 1 AND 1/2 TABLETS BY MOUTH EVERY DAY  . b complex vitamins capsule Take 1 capsule by mouth daily.  . Coenzyme Q10 (COQ10) 200 MG CAPS Take by mouth daily.  Marland Kitchen levothyroxine (SYNTHROID) 50 MCG tablet Take 1 tablet by mouth every evening.   Marland Kitchen MAGNESIUM CITRATE PO Take 250 mg by mouth daily.  . Menaquinone-7 (VITAMIN K2 PO) Take 1 tablet by mouth daily.  . Multiple Vitamins-Minerals (PRESERVISION AREDS 2) CAPS Take 1 capsule by mouth 2 (two) times daily.  Marland Kitchen MYRBETRIQ 50 MG TB24 tablet   . OVER THE COUNTER MEDICATION Take 3 capsules by mouth daily. BONE UP  . rosuvastatin (CRESTOR) 20 MG tablet Take 1 tablet (20 mg total) by mouth daily.  . TURMERIC PO Take by mouth as directed.  . Vitamin D, Cholecalciferol, 25 MCG (1000 UT) CAPS Take 1,000 Units by mouth daily.     Allergies:   Codeine  and Morphine and related   Past Medical History:  Diagnosis Date  . Atherosclerosis    aorta and coronary artery  . Atrophic vaginitis   . Cancer [facility]) 1995   Colon cancer tx surgery and chemo  . Contact lens/glasses fitting    wears contacts or glasses  . Cystocele   . Nasal fracture   . Osteopenia 06/2017   T score -2.0 FRAX 13% / 2.8%  . PONV (postoperative nausea and vomiting)   . Raynaud's disease   . Rectocele   . Rosacea   . Scleroderma ([place])   . Urinary frequency     Past Surgical History:  Procedure Laterality Date  . APPENDECTOMY    . colon cancer  1995  . COLON RESECTION  1995  . KNEE ARTHROSCOPY Left 01/14/2019   Procedure: ARTHROSCOPY KNEE, PARTIAL MENISCECTOMY, CHONDROPLASTY AND LOOSE BODY REMOVAL;  Surgeon: Dorna Leitz, MD;  Location: [facility];  Service: Orthopedics;  Laterality: Left;  Marland Kitchen MASS EXCISION Left 08/31/2012   Procedure: EXCISION MUCOID CYST DEBRIDEMENT DISTAL INTERPHALANGEAL LEFT MIDDLE FINGER;  Surgeon: Wynonia Sours, MD;  Location: [facility];  Service: Orthopedics;  Laterality: Left;  . OOPHORECTOMY     LSO  . ROTATOR CUFF REPAIR Right   . TONSILLECTOMY    . VAGINAL HYSTERECTOMY  1994     Social History:  The patient  reports that she quit smoking about 47 years ago. Her smoking use included cigarettes. She quit after 3.00 years of use. She has never used smokeless tobacco. She reports current alcohol use of about 7.0 standard drinks of alcohol per week. She reports that she does not use drugs.   Family History:  The patient's family history includes Diabetes in her brother and mother; Heart disease in her mother; Hypertension in her brother; Lupus in her father; Stroke in her mother.    ROS:  Please see the history of present illness. All other systems are reviewed and  Negative to the above problem except as noted.    PHYSICAL EXAM: VS:  BP 110/68   Pulse 82   Ht 5' 6.5" (1.689 m)   Wt 163 lb (73.9 kg)    SpO2 99%   BMI 25.91 kg/m   GEN: Well nourished, well developed, in no acute distress  HEENT: normal  Neck: JVP is nromal  NO carotid bruits Cardiac: RRR; no murmurs, no LE  edema  Respiratory:  clear to auscultation bilaterally, normal work of breathing GI: soft, nontender, nondistended, + BS  No hepatomegaly  MS: no deformity Moving all extremities   Skin: warm and dry, no rash Neuro:  Strength and sensation are intact Psych: euthymic mood, full affect   EKG:  EKG is not  ordered today.   Lipid Panel    Component Value Date/Time   CHOL 164 12/27/2018 0855   TRIG 75 12/27/2018 0855   HDL 61 12/27/2018 0855   CHOLHDL 2.7 12/27/2018 0855   LDLCALC 89 12/27/2018 0855      Wt Readings from Last 3 Encounters:  10/21/19 163 lb (73.9 kg)  09/08/19 164 lb 12.8 oz (74.8 kg)  05/31/19 164 lb (74.4 kg)      ASSESSMENT AND PLAN:  1.  CAD.  Found on cardiac on chest CT.  I am not convinced fatigue is angina   Recomm increase activity with calesthenics in home    2 dyslipidemia.  Will get lipids from [facility]  office   3.Hx   Interstitial lung disease.  Followed by Chase Caller.   4.  History of rheumatoid arthritis/scleroderma.  Followed by Dr.Deveshwar  Plan for f/u next summer        Current medicines are reviewed at length with the patient today.  The patient does not have concerns regarding medicines.  Signed, Dorris Carnes, MD  10/21/2019 9:09 AM    [address], [place], [place]  [address] Phone: [phone]; Fax: [phone]

## 2019-10-21 ENCOUNTER — Ambulatory Visit: Payer: Medicare PPO | Admitting: Internal Medicine

## 2019-10-21 ENCOUNTER — Other Ambulatory Visit: Payer: Self-pay

## 2019-10-21 ENCOUNTER — Encounter: Payer: Self-pay | Admitting: Internal Medicine

## 2019-10-21 VITALS — BP 110/68 | HR 82 | Ht 66.5 in | Wt 163.0 lb

## 2019-10-21 DIAGNOSIS — J849 Interstitial pulmonary disease, unspecified: Secondary | ICD-10-CM

## 2019-10-21 DIAGNOSIS — I10 Essential (primary) hypertension: Secondary | ICD-10-CM

## 2019-10-21 DIAGNOSIS — I2510 Atherosclerotic heart disease of native coronary artery without angina pectoris: Secondary | ICD-10-CM

## 2019-10-21 NOTE — Patient Instructions (Addendum)
Medication Instructions:  No changes *If you need a refill on your cardiac medications before your next appointment, please call your pharmacy*   Lab Work: None   Testing/Procedures: none   Follow-Up: At [facility], you and your health needs are our priority.  As part of our continuing mission to provide you with exceptional heart care, we have created designated Provider Care Teams.  These Care Teams include your primary Cardiologist (physician) and Advanced Practice Providers (APPs -  Physician Assistants and Nurse Practitioners) who all work together to provide you with the care you need, when you need it.   Your next appointment:   12 month(s)  The format for your next appointment:   In Person  Provider:   You may see Dorris Carnes, MD or one of the following Advanced Practice Providers on your designated Care Team:    Richardson Dopp, PA-C  Robbie Lis, [place]    Other Instructions We will request a copy of labs from Dr. Reynaldo Minium for your chart.  Will call if any changes are recommended based on the labs.  Addendum: Linda Hedges at [facility], the patient has only had CMET.  Not scheduled for next physical/labs until next year.  Dr. Harrington Challenger informed.  Drew, RN 10/21/19 9:46 am.

## 2019-10-21 NOTE — Progress Notes (Signed)
Office Visit Note  Patient: Lindsay Herman             Date of Birth: 03/26/1945           MRN: [id number]             PCP: Burnard Bunting, MD Referring: Burnard Bunting, MD Visit Date: 11/03/2019 Occupation: @GUAROCC @  Subjective:  Medication monitoring.   History of Present Illness: Lindsay Herman is a 74 y.o. female with history of rheumatoid arthritis and scleroderma.  She states that she has not noticed any change in her health overall.  She was evaluated by Dr. Chase Caller and her high-resolution CT was stable.  She did not get PFTs this year.  She also was seen by Dr. Harrington Challenger, her cardiologist.  She does not recall getting echocardiogram.  Have advised her to discuss echocardiogram with Dr. Harrington Challenger to screen for pulmonary hypertension.  She denies any dysphagia.  She states she still have some Raynaud's but it is not very active.  She denies any joint swelling.  She has been tolerating Imuran well.  Activities of Daily Living:  Patient reports morning stiffness for 30 minutes.   Patient Denies nocturnal pain.  Difficulty dressing/grooming: Denies Difficulty climbing stairs: Denies Difficulty getting out of chair: Denies Difficulty using hands for taps, buttons, cutlery, and/or writing: Denies  Review of Systems  Constitutional: Positive for fatigue.  HENT: Negative for mouth dryness.   Eyes: Negative for dryness.  Respiratory: Negative for shortness of breath.   Cardiovascular: Negative for swelling in legs/feet.  Gastrointestinal: Negative for constipation.  Endocrine: Positive for increased urination.  Genitourinary: Negative for difficulty urinating.  Musculoskeletal: Positive for arthralgias, gait problem, joint pain, morning stiffness and muscle tenderness.  Skin: Negative for rash.  Allergic/Immunologic: Negative for susceptible to infections.  Neurological: Negative for numbness.  Hematological: Negative for bruising/bleeding tendency.  Psychiatric/Behavioral:  Positive for sleep disturbance.    PMFS History:  Patient Active Problem List   Diagnosis Date Noted  . Acute medial meniscal tear, left, initial encounter 01/14/2019  . Chondromalacia, left knee 01/14/2019  . Loose body of left knee 01/14/2019  . Scleroderma, limited ([place]) 02/10/2016  . History of environmental allergies 02/10/2016  . Gastroesophageal reflux disease 02/10/2016  . History of colon cancer 02/10/2016  . Sicca syndrome 02/01/2016  . History of repair of right rotator cuff 02/01/2016  . Primary osteoarthritis of both knees 02/01/2016  . Primary osteoarthritis of both hands 02/01/2016  . Raynaud's disease without gangrene 02/01/2016  . Cystocele   . Atrophic vaginitis   . Osteopenia   . Menopausal symptoms     Past Medical History:  Diagnosis Date  . Atherosclerosis    aorta and coronary artery  . Atrophic vaginitis   . Cancer [facility]) 1995   Colon cancer tx surgery and chemo  . Contact lens/glasses fitting    wears contacts or glasses  . Cystocele   . Nasal fracture   . Osteopenia 06/2017   T score -2.0 FRAX 13% / 2.8%  . PONV (postoperative nausea and vomiting)   . Raynaud's disease   . Rectocele   . Rosacea   . Scleroderma ([place])   . Urinary frequency     Family History  Problem Relation Age of Onset  . Diabetes Mother   . Heart disease Mother   . Stroke Mother   . Diabetes Brother   . Hypertension Brother   . Lupus Father    Past Surgical History:  Procedure Laterality  Date  . APPENDECTOMY    . colon cancer  1995  . COLON RESECTION  1995  . KNEE ARTHROSCOPY Left 01/14/2019   Procedure: ARTHROSCOPY KNEE, PARTIAL MENISCECTOMY, CHONDROPLASTY AND LOOSE BODY REMOVAL;  Surgeon: Dorna Leitz, MD;  Location: [place];  Service: Orthopedics;  Laterality: Left;  Marland Kitchen MASS EXCISION Left 08/31/2012   Procedure: EXCISION MUCOID CYST DEBRIDEMENT DISTAL INTERPHALANGEAL LEFT MIDDLE FINGER;  Surgeon: Wynonia Sours, MD;  Location: [place];  Service: Orthopedics;  Laterality: Left;  . OOPHORECTOMY     LSO  . ROTATOR CUFF REPAIR Right   . TONSILLECTOMY    . VAGINAL HYSTERECTOMY  1994   Social History   Social History Narrative  . Not on file   Immunization History  Administered Date(s) Administered  . Influenza Split 12/12/2010  . Influenza, High Dose Seasonal PF 10/24/2017, 11/08/2018  . PFIZER SARS-COV-2 Vaccination 03/18/2019, 04/12/2019, 09/29/2019  . Pneumococcal Conjugate-13 07/24/2014  . Pneumococcal Polysaccharide-23 01/31/2009  . Zoster Recombinat (Shingrix) 11/27/2017, 02/12/2018     Objective: Vital Signs: BP 120/72 (BP Location: Left Arm, Patient Position: Sitting, Cuff Size: Normal)   Pulse 83   Resp 16   Ht 5' 6.5" (1.689 m)   Wt 164 lb 6.4 oz (74.6 kg)   BMI 26.14 kg/m    Physical Exam Vitals and nursing note reviewed.  Constitutional:      Appearance: She is well-developed.  HENT:     Head: Normocephalic and atraumatic.  Eyes:     Conjunctiva/sclera: Conjunctivae normal.  Cardiovascular:     Rate and Rhythm: Normal rate and regular rhythm.     Heart sounds: Normal heart sounds.  Pulmonary:     Effort: Pulmonary effort is normal.     Breath sounds: Normal breath sounds.     Comments: Crackles in the lung bases. Abdominal:     General: Bowel sounds are normal.     Palpations: Abdomen is soft.  Musculoskeletal:     Cervical back: Normal range of motion.  Lymphadenopathy:     Cervical: No cervical adenopathy.  Skin:    General: Skin is warm and dry.     Capillary Refill: Capillary refill takes 2 to 3 seconds.     Comments: Sclerodactyly was noted in bilateral hands.  Neurological:     Mental Status: She is alert and oriented to person, place, and time.  Psychiatric:        Behavior: Behavior normal.      Musculoskeletal Exam: She has good range of motion of cervical spine, shoulders, elbow joints, wrist joints.  She has bilateral PIP and DIP thickening with no synovitis.   Hip joints, knee joints, ankles and MTPs with good range of motion with no tenderness.  CDAI Exam: CDAI Score: -- Patient Global: --; Provider Global: -- Swollen: --; Tender: -- Joint Exam 11/03/2019   No joint exam has been documented for this visit   There is currently no information documented on the homunculus. Go to the Rheumatology activity and complete the homunculus joint exam.  Investigation: No additional findings.  Imaging: No results found.  Recent Labs: Lab Results  Component Value Date   WBC 6.3 08/10/2019   HGB 13.3 08/10/2019   PLT 279 08/10/2019   NA 141 08/10/2019   K 4.6 08/10/2019   CL 105 08/10/2019   CO2 28 08/10/2019   GLUCOSE 85 08/10/2019   BUN 13 08/10/2019   CREATININE 0.73 08/10/2019   BILITOT 0.4 08/10/2019  ALKPHOS 64 10/19/2018   AST 14 08/10/2019   ALT 9 08/10/2019   PROT 6.6 08/10/2019   ALBUMIN 4.3 10/19/2018   CALCIUM 9.9 08/10/2019   GFRAA 94 08/10/2019   QFTBGOLDPLUS NEGATIVE 10/06/2017    Speciality Comments: Osteoporosis managed by Dr. Phineas Real.  Procedures:  No procedures performed Allergies: Codeine and Morphine and related   Assessment / Plan:     Visit Diagnoses: Seronegative rheumatoid arthritis ([place]) - RF-, CCP-, 14-3-3 eta negative, R wrist synovitis, erosive changes, intercarpal and radiocarpal joint space narrowing.  She is doing well with no synovitis on examination.  High risk medication use - Imuran 75 mg po daily. Off PLQ due to Macular Degenration Concern. -Her labs have been stable.  We will check labs today and then every 3 months to monitor for drug toxicity.  Plan: CBC with Differential/Platelet, COMPLETE METABOLIC PANEL WITH GFR  Scleroderma, limited ([facility]) - Hx sclerodactyly, Raynauds, arthralgias, +ANA, +Ro, +La, +RF.  She has no progression of his sclerodactyly.  Raynaud's has been tolerable so far.  Interstitial lung disease ([place]) - followed by Dr. Chase Caller and Dorris Carnes.  According to patient PFTs  were not done as her high-resolution CT was stable.  She will discuss echocardiogram with Dr. Harrington Challenger.  TPMT intermediate metabolizer ([place])  Raynaud's disease without gangrene-warm clothing was discussed.  Primary osteoarthritis of both hands-joint protection was discussed.  Primary osteoarthritis of both knees-she is currently not having much knee joint discomfort.  History of gastroesophageal reflux (GERD)  History of repair of right rotator cuff  S/P arthroscopy of right knee  History of colon cancer  Osteopenia of multiple sites - March 28, 2019 DEXA showed T score of -2.0.  She is on calcium and vitamin D currently - Plan: VITAMIN D 25 Hydroxy (Vit-D Deficiency, Fractures)  Educated about COVID-19 virus infection-she is fully vaccinated against COVID-19.  She also had booster.  Use of mask, social distancing and hand hygiene was discussed.  I also discussed that she may be candidate for monoclonal antibody infusion in case she gets COVID-19 infection.  Orders: Orders Placed This Encounter  Procedures  . CBC with Differential/Platelet  . COMPLETE METABOLIC PANEL WITH GFR  . VITAMIN D 25 Hydroxy (Vit-D Deficiency, Fractures)   No orders of the defined types were placed in this encounter.     Follow-Up Instructions: Return in about 5 months (around 04/04/2020) for Scleroderma, Rheumatoid arthritis.   Bo Merino, MD  Note - This record has been created using Editor, commissioning.  Chart creation errors have been sought, but may not always  have been located. Such creation errors do not reflect on  the standard of medical care.

## 2019-11-03 ENCOUNTER — Encounter: Payer: Self-pay | Admitting: Rheumatology

## 2019-11-03 ENCOUNTER — Other Ambulatory Visit: Payer: Self-pay

## 2019-11-03 ENCOUNTER — Ambulatory Visit: Payer: Medicare PPO | Admitting: Rheumatology

## 2019-11-03 VITALS — BP 120/72 | HR 83 | Resp 16 | Ht 66.5 in | Wt 164.4 lb

## 2019-11-03 DIAGNOSIS — E8889 Other specified metabolic disorders: Secondary | ICD-10-CM

## 2019-11-03 DIAGNOSIS — J849 Interstitial pulmonary disease, unspecified: Secondary | ICD-10-CM

## 2019-11-03 DIAGNOSIS — M0600 Rheumatoid arthritis without rheumatoid factor, unspecified site: Secondary | ICD-10-CM

## 2019-11-03 DIAGNOSIS — Z79899 Other long term (current) drug therapy: Secondary | ICD-10-CM

## 2019-11-03 DIAGNOSIS — M170 Bilateral primary osteoarthritis of knee: Secondary | ICD-10-CM

## 2019-11-03 DIAGNOSIS — M349 Systemic sclerosis, unspecified: Secondary | ICD-10-CM | POA: Diagnosis not present

## 2019-11-03 DIAGNOSIS — M8589 Other specified disorders of bone density and structure, multiple sites: Secondary | ICD-10-CM

## 2019-11-03 DIAGNOSIS — Z85038 Personal history of other malignant neoplasm of large intestine: Secondary | ICD-10-CM

## 2019-11-03 DIAGNOSIS — Z8719 Personal history of other diseases of the digestive system: Secondary | ICD-10-CM | POA: Diagnosis not present

## 2019-11-03 DIAGNOSIS — I7300 Raynaud's syndrome without gangrene: Secondary | ICD-10-CM

## 2019-11-03 DIAGNOSIS — Z7189 Other specified counseling: Secondary | ICD-10-CM

## 2019-11-03 DIAGNOSIS — M19041 Primary osteoarthritis, right hand: Secondary | ICD-10-CM | POA: Diagnosis not present

## 2019-11-03 DIAGNOSIS — M8580 Other specified disorders of bone density and structure, unspecified site: Secondary | ICD-10-CM | POA: Diagnosis not present

## 2019-11-03 DIAGNOSIS — M19042 Primary osteoarthritis, left hand: Secondary | ICD-10-CM

## 2019-11-03 DIAGNOSIS — Z9889 Other specified postprocedural states: Secondary | ICD-10-CM

## 2019-11-03 NOTE — Patient Instructions (Addendum)
COVID-19 vaccine recommendations:  ° °COVID-19 vaccine is recommended for everyone (unless you are allergic to a vaccine component), even if you are on a medication that suppresses your immune system.  ° °If you are on Methotrexate, Cellcept (mycophenolate), Rinvoq, Xeljanz, and Olumiant- hold the medication for 1 week after each vaccine. Hold Methotrexate for 2 weeks after the single dose COVID-19 vaccine.  ° °If you are on Orencia subcutaneous injection - hold medication one week prior to and one week after the first COVID-19 vaccine dose (only).  ° °If you are on Orencia IV infusions- time vaccination administration so that the first COVID-19 vaccination will occur four weeks after the infusion and postpone the subsequent infusion by one week.  ° °If you are on Cyclophosphamide or Rituxan infusions please contact your doctor prior to receiving the COVID-19 vaccine.  ° °Do not take Tylenol or any anti-inflammatory medications (NSAIDs) 24 hours prior to the COVID-19 vaccination.  ° °There is no direct evidence about the efficacy of the COVID-19 vaccine in individuals who are on medications that suppress the immune system.  ° °Even if you are fully vaccinated, and you are on any medications that suppress your immune system, please continue to wear a mask, maintain at least six feet social distance and practice hand hygiene.  ° °If you develop a COVID-19 infection, please contact your PCP or our office to determine if you need antibody infusion. ° °The booster vaccine is now available for immunocompromised patients. It is advised that if you had Pfizer vaccine you should get Pfizer booster.  If you had a Moderna vaccine then you should get a Moderna booster. Johnson and Johnson does not have a booster vaccine at this time. ° °Please see the following web sites for updated information.   ° °https://www.rheumatology.org/Portals/0/Files/COVID-19-Vaccination-Patient-Resources.pdf ° °https://www.rheumatology.org/About-Us/Newsroom/Press-Releases/ID/1159 ° °Standing Labs °We placed an order today for your standing lab work.  ° °Please have your standing labs drawn in December and every 3 months  ° °If possible, please have your labs drawn 2 weeks prior to your appointment so that the provider can discuss your results at your appointment. ° °We have open lab daily °Monday through Thursday from 8:30-12:30 PM and 1:30-4:30 PM and Friday from 8:30-12:30 PM and 1:30-4:00 PM °at the office of Dr. Barry Culverhouse, [facility].   °Please be advised, patients with office appointments requiring lab work will take precedents over walk-in lab work.  °If possible, please come for your lab work on Monday and Friday afternoons, as you may experience shorter wait times. °The office is located at [address], [address], [place], [place] [address] °No appointment is necessary.   °Labs are drawn by [facility]. Please bring your co-pay at the time of your lab draw.  You may receive a bill from [facility] for your lab work. ° °If you wish to have your labs drawn at another location, please call the office 24 hours in advance to send orders. ° °If you have any questions regarding directions or hours of operation,  °please call [phone].   °As a reminder, please drink plenty of water prior to coming for your lab work. Thanks! ° ° °

## 2019-11-04 LAB — COMPLETE METABOLIC PANEL WITH GFR
AG Ratio: 2 (calc) (ref 1.0–2.5)
ALT: 10 U/L (ref 6–29)
AST: 15 U/L (ref 10–35)
Albumin: 4.2 g/dL (ref 3.6–5.1)
Alkaline phosphatase (APISO): 56 U/L (ref 37–153)
BUN: 17 mg/dL (ref 7–25)
CO2: 32 mmol/L (ref 20–32)
Calcium: 10.1 mg/dL (ref 8.6–10.4)
Chloride: 104 mmol/L (ref 98–110)
Creat: 0.71 mg/dL (ref 0.60–0.93)
GFR, Est African American: 97 mL/min/{1.73_m2} (ref >=60)
GFR, Est Non African American: 84 mL/min/{1.73_m2} (ref >=60)
Globulin: 2.1 g/dL (calc) (ref 1.9–3.7)
Glucose, Bld: 85 mg/dL (ref 65–99)
Potassium: 5 mmol/L (ref 3.5–5.3)
Sodium: 141 mmol/L (ref 135–146)
Total Bilirubin: 0.3 mg/dL (ref 0.2–1.2)
Total Protein: 6.3 g/dL (ref 6.1–8.1)

## 2019-11-04 LAB — CBC WITH DIFFERENTIAL/PLATELET
Absolute Monocytes: 800 cells/uL (ref 200–950)
Basophils Absolute: 41 cells/uL (ref 0–200)
Basophils Relative: 0.6 %
Eosinophils Absolute: 41 cells/uL (ref 15–500)
Eosinophils Relative: 0.6 %
HCT: 41.4 % (ref 35.0–45.0)
Hemoglobin: 13.6 g/dL (ref 11.7–15.5)
Lymphs Abs: 662 cells/uL — ABNORMAL LOW (ref 850–3900)
MCH: 32.5 pg (ref 27.0–33.0)
MCHC: 32.9 g/dL (ref 32.0–36.0)
MCV: 98.8 fL (ref 80.0–100.0)
MPV: 11.1 fL (ref 7.5–12.5)
Monocytes Relative: 11.6 %
Neutro Abs: 5354 cells/uL (ref 1500–7800)
Neutrophils Relative %: 77.6 %
Platelets: 310 10*3/uL (ref 140–400)
RBC: 4.19 10*6/uL (ref 3.80–5.10)
RDW: 13.2 % (ref 11.0–15.0)
Total Lymphocyte: 9.6 %
WBC: 6.9 10*3/uL (ref 3.8–10.8)

## 2019-11-04 LAB — VITAMIN D 25 HYDROXY (VIT D DEFICIENCY, FRACTURES): Vit D, 25-Hydroxy: 31 ng/mL (ref 30–100)

## 2019-11-04 NOTE — Progress Notes (Signed)
CBC shows low lymphocyte count.  We will continue to monitor.CMP are normal.  Vitamin D is normal.

## 2019-12-13 ENCOUNTER — Other Ambulatory Visit: Payer: Self-pay | Admitting: Rheumatology

## 2019-12-13 DIAGNOSIS — Z79899 Other long term (current) drug therapy: Secondary | ICD-10-CM

## 2019-12-14 LAB — COMPLETE METABOLIC PANEL WITH GFR
AG Ratio: 2 (calc) (ref 1.0–2.5)
ALT: 11 U/L (ref 6–29)
AST: 16 U/L (ref 10–35)
Albumin: 4.3 g/dL (ref 3.6–5.1)
Alkaline phosphatase (APISO): 60 U/L (ref 37–153)
BUN: 12 mg/dL (ref 7–25)
CO2: 31 mmol/L (ref 20–32)
Calcium: 9.7 mg/dL (ref 8.6–10.4)
Chloride: 102 mmol/L (ref 98–110)
Creat: 0.75 mg/dL (ref 0.60–0.93)
GFR, Est African American: 91 mL/min/{1.73_m2} (ref >=60)
GFR, Est Non African American: 79 mL/min/{1.73_m2} (ref >=60)
Globulin: 2.1 g/dL (calc) (ref 1.9–3.7)
Glucose, Bld: 82 mg/dL (ref 65–99)
Potassium: 4.8 mmol/L (ref 3.5–5.3)
Sodium: 139 mmol/L (ref 135–146)
Total Bilirubin: 0.5 mg/dL (ref 0.2–1.2)
Total Protein: 6.4 g/dL (ref 6.1–8.1)

## 2019-12-14 LAB — CBC WITH DIFFERENTIAL/PLATELET
Absolute Monocytes: 653 cells/uL (ref 200–950)
Basophils Absolute: 22 cells/uL (ref 0–200)
Basophils Relative: 0.4 %
Eosinophils Absolute: 38 cells/uL (ref 15–500)
Eosinophils Relative: 0.7 %
HCT: 41.6 % (ref 35.0–45.0)
Hemoglobin: 13.9 g/dL (ref 11.7–15.5)
Lymphs Abs: 702 cells/uL — ABNORMAL LOW (ref 850–3900)
MCH: 32.8 pg (ref 27.0–33.0)
MCHC: 33.4 g/dL (ref 32.0–36.0)
MCV: 98.1 fL (ref 80.0–100.0)
MPV: 10.9 fL (ref 7.5–12.5)
Monocytes Relative: 12.1 %
Neutro Abs: 3985 cells/uL (ref 1500–7800)
Neutrophils Relative %: 73.8 %
Platelets: 307 10*3/uL (ref 140–400)
RBC: 4.24 10*6/uL (ref 3.80–5.10)
RDW: 13.1 % (ref 11.0–15.0)
Total Lymphocyte: 13 %
WBC: 5.4 10*3/uL (ref 3.8–10.8)

## 2019-12-26 ENCOUNTER — Other Ambulatory Visit: Payer: Self-pay | Admitting: Rheumatology

## 2019-12-26 NOTE — Telephone Encounter (Signed)
Last Visit: 11/03/2019 Next Visit: 04/05/2020 Labs: 12/13/2019 CMP WNL. Absolute lymphocytes low but trending up. WBC count is WNL. Rest of CBC WNL.   Current Dose per office note 11/03/2019: Imuran 75 mg po daily DX: Seronegative rheumatoid arthritis  Okay to refill Imuran?

## 2019-12-29 ENCOUNTER — Other Ambulatory Visit: Payer: Self-pay | Admitting: Internal Medicine

## 2019-12-30 ENCOUNTER — Other Ambulatory Visit: Payer: Self-pay | Admitting: Internal Medicine

## 2020-03-23 NOTE — Progress Notes (Deleted)
Office Visit Note  Patient: Lindsay Herman             Date of Birth: 26-Sep-1945           MRN: [id number]             PCP: Burnard Bunting, MD Referring: Burnard Bunting, MD Visit Date: 04/05/2020 Occupation: @GUAROCC @  Subjective:  No chief complaint on file.   History of Present Illness: Lindsay Herman is a 75 y.o. female ***   Activities of Daily Living:  Patient reports morning stiffness for *** {minute/hour:19697}.   Patient {ACTIONS;DENIES/REPORTS:21021675::"Denies"} nocturnal pain.  Difficulty dressing/grooming: {ACTIONS;DENIES/REPORTS:21021675::"Denies"} Difficulty climbing stairs: {ACTIONS;DENIES/REPORTS:21021675::"Denies"} Difficulty getting out of chair: {ACTIONS;DENIES/REPORTS:21021675::"Denies"} Difficulty using hands for taps, buttons, cutlery, and/or writing: {ACTIONS;DENIES/REPORTS:21021675::"Denies"}  No Rheumatology ROS completed.   PMFS History:  Patient Active Problem List   Diagnosis Date Noted  . Acute medial meniscal tear, left, initial encounter 01/14/2019  . Chondromalacia, left knee 01/14/2019  . Loose body of left knee 01/14/2019  . Scleroderma, limited ([place]) 02/10/2016  . History of environmental allergies 02/10/2016  . Gastroesophageal reflux disease 02/10/2016  . History of colon cancer 02/10/2016  . Sicca syndrome 02/01/2016  . History of repair of right rotator cuff 02/01/2016  . Primary osteoarthritis of both knees 02/01/2016  . Primary osteoarthritis of both hands 02/01/2016  . Raynaud's disease without gangrene 02/01/2016  . Cystocele   . Atrophic vaginitis   . Osteopenia   . Menopausal symptoms     Past Medical History:  Diagnosis Date  . Atherosclerosis    aorta and coronary artery  . Atrophic vaginitis   . Cancer [facility]) 1995   Colon cancer tx surgery and chemo  . Contact lens/glasses fitting    wears contacts or glasses  . Cystocele   . Nasal fracture   . Osteopenia 06/2017   T score -2.0 FRAX 13% / 2.8%  . PONV  (postoperative nausea and vomiting)   . Raynaud's disease   . Rectocele   . Rosacea   . Scleroderma ([place])   . Urinary frequency     Family History  Problem Relation Age of Onset  . Diabetes Mother   . Heart disease Mother   . Stroke Mother   . Diabetes Brother   . Hypertension Brother   . Lupus Father    Past Surgical History:  Procedure Laterality Date  . APPENDECTOMY    . colon cancer  1995  . COLON RESECTION  1995  . KNEE ARTHROSCOPY Left 01/14/2019   Procedure: ARTHROSCOPY KNEE, PARTIAL MENISCECTOMY, CHONDROPLASTY AND LOOSE BODY REMOVAL;  Surgeon: Dorna Leitz, MD;  Location: [place];  Service: Orthopedics;  Laterality: Left;  Marland Kitchen MASS EXCISION Left 08/31/2012   Procedure: EXCISION MUCOID CYST DEBRIDEMENT DISTAL INTERPHALANGEAL LEFT MIDDLE FINGER;  Surgeon: Wynonia Sours, MD;  Location: [place];  Service: Orthopedics;  Laterality: Left;  . OOPHORECTOMY     LSO  . ROTATOR CUFF REPAIR Right   . TONSILLECTOMY    . VAGINAL HYSTERECTOMY  1994   Social History   Social History Narrative  . Not on file   Immunization History  Administered Date(s) Administered  . Influenza Split 12/12/2010  . Influenza, High Dose Seasonal PF 10/24/2017, 11/08/2018  . PFIZER(Purple Top)SARS-COV-2 Vaccination 03/18/2019, 04/12/2019, 09/29/2019  . Pneumococcal Conjugate-13 07/24/2014  . Pneumococcal Polysaccharide-23 01/31/2009  . Zoster Recombinat (Shingrix) 11/27/2017, 02/12/2018     Objective: Vital Signs: There were no vitals taken for this visit.  Physical Exam   Musculoskeletal Exam: ***  CDAI Exam: CDAI Score: - Patient Global: -; Provider Global: - Swollen: -; Tender: - Joint Exam 04/05/2020   No joint exam has been documented for this visit   There is currently no information documented on the homunculus. Go to the Rheumatology activity and complete the homunculus joint exam.  Investigation: No additional findings.  Imaging: No  results found.  Recent Labs: Lab Results  Component Value Date   WBC 5.4 12/13/2019   HGB 13.9 12/13/2019   PLT 307 12/13/2019   NA 139 12/13/2019   K 4.8 12/13/2019   CL 102 12/13/2019   CO2 31 12/13/2019   GLUCOSE 82 12/13/2019   BUN 12 12/13/2019   CREATININE 0.75 12/13/2019   BILITOT 0.5 12/13/2019   ALKPHOS 64 10/19/2018   AST 16 12/13/2019   ALT 11 12/13/2019   PROT 6.4 12/13/2019   ALBUMIN 4.3 10/19/2018   CALCIUM 9.7 12/13/2019   GFRAA 91 12/13/2019   QFTBGOLDPLUS NEGATIVE 10/06/2017    Speciality Comments: Osteoporosis managed by Dr. Phineas Real.  Procedures:  No procedures performed Allergies: Codeine and Morphine and related   Assessment / Plan:     Visit Diagnoses: No diagnosis found.  Orders: No orders of the defined types were placed in this encounter.  No orders of the defined types were placed in this encounter.   Face-to-face time spent with patient was *** minutes. Greater than 50% of time was spent in counseling and coordination of care.  Follow-Up Instructions: No follow-ups on file.   Earnestine Mealing, CMA  Note - This record has been created using Editor, commissioning.  Chart creation errors have been sought, but may not always  have been located. Such creation errors do not reflect on  the standard of medical care.

## 2020-03-27 DIAGNOSIS — Z124 Encounter for screening for malignant neoplasm of cervix: Secondary | ICD-10-CM | POA: Diagnosis not present

## 2020-03-27 DIAGNOSIS — N8111 Cystocele, midline: Secondary | ICD-10-CM | POA: Diagnosis not present

## 2020-03-27 DIAGNOSIS — Z6825 Body mass index (BMI) 25.0-25.9, adult: Secondary | ICD-10-CM | POA: Diagnosis not present

## 2020-03-27 DIAGNOSIS — M8580 Other specified disorders of bone density and structure, unspecified site: Secondary | ICD-10-CM | POA: Diagnosis not present

## 2020-03-27 DIAGNOSIS — Z01411 Encounter for gynecological examination (general) (routine) with abnormal findings: Secondary | ICD-10-CM | POA: Diagnosis not present

## 2020-03-27 DIAGNOSIS — Z1231 Encounter for screening mammogram for malignant neoplasm of breast: Secondary | ICD-10-CM | POA: Diagnosis not present

## 2020-03-27 DIAGNOSIS — Z01419 Encounter for gynecological examination (general) (routine) without abnormal findings: Secondary | ICD-10-CM | POA: Diagnosis not present

## 2020-03-28 ENCOUNTER — Other Ambulatory Visit: Payer: Self-pay

## 2020-03-28 ENCOUNTER — Other Ambulatory Visit: Payer: Self-pay | Admitting: *Deleted

## 2020-03-28 DIAGNOSIS — Z79899 Other long term (current) drug therapy: Secondary | ICD-10-CM

## 2020-03-28 MED ORDER — AZATHIOPRINE 50 MG PO TABS: 75.0000 mg | ORAL_TABLET | Freq: Every day | ORAL | 0 refills | Status: DC

## 2020-03-28 NOTE — Telephone Encounter (Signed)
RX faxed from [facility], [place]  Last Visit: 11/03/2019 Next Visit: 04/05/2020 Labs: 03/28/2020 pending  12/13/2019, CMP WNL. Absolute lymphocytes low but trending up. WBC count is WNL. Rest of CBC WNL.   Current Dose per office note 11/03/2019, Imuran 75 mg po daily DX: Seronegative rheumatoid arthritis   Last Fill: 12/26/2019  Okay to refill Azathioprine?

## 2020-03-28 NOTE — Telephone Encounter (Signed)
Please pend orders for CBC and CMP to be drawn at upcoming appointment on 04/05/20.

## 2020-03-29 LAB — CBC WITH DIFFERENTIAL/PLATELET
Absolute Monocytes: 589 cells/uL (ref 200–950)
Basophils Absolute: 28 cells/uL (ref 0–200)
Basophils Relative: 0.5 %
Eosinophils Absolute: 39 cells/uL (ref 15–500)
Eosinophils Relative: 0.7 %
HCT: 41.5 % (ref 35.0–45.0)
Hemoglobin: 13.9 g/dL (ref 11.7–15.5)
Lymphs Abs: 754 cells/uL — ABNORMAL LOW (ref 850–3900)
MCH: 33.3 pg — ABNORMAL HIGH (ref 27.0–33.0)
MCHC: 33.5 g/dL (ref 32.0–36.0)
MCV: 99.5 fL (ref 80.0–100.0)
MPV: 11 fL (ref 7.5–12.5)
Monocytes Relative: 10.7 %
Neutro Abs: 4092 cells/uL (ref 1500–7800)
Neutrophils Relative %: 74.4 %
Platelets: 303 10*3/uL (ref 140–400)
RBC: 4.17 10*6/uL (ref 3.80–5.10)
RDW: 13.3 % (ref 11.0–15.0)
Total Lymphocyte: 13.7 %
WBC: 5.5 10*3/uL (ref 3.8–10.8)

## 2020-03-29 LAB — COMPLETE METABOLIC PANEL WITH GFR
AG Ratio: 1.7 (calc) (ref 1.0–2.5)
ALT: 12 U/L (ref 6–29)
AST: 20 U/L (ref 10–35)
Albumin: 4 g/dL (ref 3.6–5.1)
Alkaline phosphatase (APISO): 64 U/L (ref 37–153)
BUN: 8 mg/dL (ref 7–25)
CO2: 28 mmol/L (ref 20–32)
Calcium: 9.4 mg/dL (ref 8.6–10.4)
Chloride: 105 mmol/L (ref 98–110)
Creat: 0.71 mg/dL (ref 0.60–0.93)
GFR, Est African American: 97 mL/min/{1.73_m2} (ref >=60)
GFR, Est Non African American: 83 mL/min/{1.73_m2} (ref >=60)
Globulin: 2.3 g/dL (calc) (ref 1.9–3.7)
Glucose, Bld: 102 mg/dL — ABNORMAL HIGH (ref 65–99)
Potassium: 4.8 mmol/L (ref 3.5–5.3)
Sodium: 140 mmol/L (ref 135–146)
Total Bilirubin: 0.6 mg/dL (ref 0.2–1.2)
Total Protein: 6.3 g/dL (ref 6.1–8.1)

## 2020-03-29 NOTE — Progress Notes (Signed)
Lymphocyte count is low due to treatment but is stable.  CMP is normal.

## 2020-04-02 NOTE — Progress Notes (Signed)
Office Visit Note  Patient: Lindsay Herman             Date of Birth: 04-30-1945           MRN: [id number]             PCP: Geoffry Paradise, MD Referring: Geoffry Paradise, MD Visit Date: 04/03/2020 Occupation: @GUAROCC @  Subjective:  Medication monitoring   History of Present Illness: Lindsay Herman is a 75 y.o. female with the history of from scleroderma, ILD, inflammatory arthritis.  She had no joint swelling or inflammation on my examination.  She denies any sicca symptoms.  She has not noticed any worsening of the skin tightness.  She denies any history of reflux.  She has not noticed any worsening of her renal symptoms.  She denies any shortness of breath.  She was recently evaluated by Dr. Marchelle Gearing and was told that her ILD is a stable.  Activities of Daily Living:  Patient reports morning stiffness for 0 minutes.   Patient Denies nocturnal pain.  Difficulty dressing/grooming: Denies Difficulty climbing stairs: Denies Difficulty getting out of chair: Denies Difficulty using hands for taps, buttons, cutlery, and/or writing: Denies  Review of Systems  Constitutional: Positive for fatigue.  HENT: Negative for mouth sores, mouth dryness and nose dryness.   Eyes: Negative for pain, itching and dryness.  Respiratory: Negative for shortness of breath and difficulty breathing.   Cardiovascular: Negative for chest pain and palpitations.  Gastrointestinal: Negative for blood in stool, constipation and diarrhea.  Endocrine: Negative for increased urination.  Genitourinary: Negative for difficulty urinating.  Musculoskeletal: Negative for arthralgias, joint pain, joint swelling, myalgias, morning stiffness, muscle tenderness and myalgias.  Skin: Negative for color change, rash and redness.  Allergic/Immunologic: Negative for susceptible to infections.  Neurological: Negative for dizziness, numbness, headaches, memory loss and weakness.  Hematological: Negative for  bruising/bleeding tendency.  Psychiatric/Behavioral: Negative for confusion.    PMFS History:  Patient Active Problem List   Diagnosis Date Noted  . Acute medial meniscal tear, left, initial encounter 01/14/2019  . Chondromalacia, left knee 01/14/2019  . Loose body of left knee 01/14/2019  . Scleroderma, limited (HCC) 02/10/2016  . History of environmental allergies 02/10/2016  . Gastroesophageal reflux disease 02/10/2016  . History of colon cancer 02/10/2016  . Sicca syndrome 02/01/2016  . History of repair of right rotator cuff 02/01/2016  . Primary osteoarthritis of both knees 02/01/2016  . Primary osteoarthritis of both hands 02/01/2016  . Raynaud's disease without gangrene 02/01/2016  . Cystocele   . Atrophic vaginitis   . Osteopenia   . Menopausal symptoms     Past Medical History:  Diagnosis Date  . Atherosclerosis    aorta and coronary artery  . Atrophic vaginitis   . Cancer [facility]) 1995   Colon cancer tx surgery and chemo  . Contact lens/glasses fitting    wears contacts or glasses  . Cystocele   . Nasal fracture   . Osteopenia 06/2017   T score -2.0 FRAX 13% / 2.8%  . PONV (postoperative nausea and vomiting)   . Raynaud's disease   . Rectocele   . Rosacea   . Scleroderma (HCC)   . Urinary frequency     Family History  Problem Relation Age of Onset  . Diabetes Mother   . Heart disease Mother   . Stroke Mother   . Diabetes Brother   . Hypertension Brother   . Lupus Father    Past Surgical History:  Procedure  Laterality Date  . APPENDECTOMY    . colon cancer  1995  . COLON RESECTION  1995  . KNEE ARTHROSCOPY Left 01/14/2019   Procedure: ARTHROSCOPY KNEE, PARTIAL MENISCECTOMY, CHONDROPLASTY AND LOOSE BODY REMOVAL;  Surgeon: Jodi Geralds, MD;  Location: [facility];  Service: Orthopedics;  Laterality: Left;  Marland Kitchen MASS EXCISION Left 08/31/2012   Procedure: EXCISION MUCOID CYST DEBRIDEMENT DISTAL INTERPHALANGEAL LEFT MIDDLE FINGER;  Surgeon:  Nicki Reaper, MD;  Location: [facility];  Service: Orthopedics;  Laterality: Left;  . OOPHORECTOMY     LSO  . ROTATOR CUFF REPAIR Right   . TONSILLECTOMY    . VAGINAL HYSTERECTOMY  1994   Social History   Social History Narrative  . Not on file   Immunization History  Administered Date(s) Administered  . Influenza Split 12/12/2010  . Influenza, High Dose Seasonal PF 10/24/2017, 11/08/2018  . PFIZER(Purple Top)SARS-COV-2 Vaccination 03/18/2019, 04/12/2019, 09/29/2019, 03/27/2020  . Pneumococcal Conjugate-13 07/24/2014  . Pneumococcal Polysaccharide-23 01/31/2009  . Zoster Recombinat (Shingrix) 11/27/2017, 02/12/2018     Objective: Vital Signs: BP 109/75 (BP Location: Left Arm, Patient Position: Sitting, Cuff Size: Normal)   Pulse 80   Resp 16   Ht 5' 6.5" (1.689 m)   Wt 161 lb 9.6 oz (73.3 kg)   BMI 25.69 kg/m    Physical Exam Vitals and nursing note reviewed.  Constitutional:      Appearance: She is well-developed and well-nourished.  HENT:     Head: Normocephalic and atraumatic.  Eyes:     Extraocular Movements: EOM normal.     Conjunctiva/sclera: Conjunctivae normal.  Cardiovascular:     Rate and Rhythm: Normal rate and regular rhythm.     Pulses: Intact distal pulses.     Heart sounds: Normal heart sounds.  Pulmonary:     Effort: Pulmonary effort is normal.     Breath sounds: Normal breath sounds.  Abdominal:     General: Bowel sounds are normal.     Palpations: Abdomen is soft.  Musculoskeletal:     Cervical back: Normal range of motion.  Lymphadenopathy:     Cervical: No cervical adenopathy.  Skin:    General: Skin is warm and dry.     Capillary Refill: Capillary refill takes less than 2 seconds.     Comments: Sclerodactyly was noticed distal to MCPs.  Nailbed capillary dropout was noted.  Neurological:     Mental Status: She is alert and oriented to person, place, and time.  Psychiatric:        Mood and Affect: Mood and affect normal.         Behavior: Behavior normal.      Musculoskeletal Exam: C-spine was in good range of motion.  Shoulder joints, elbow joints, wrist joints, MCPs PIPs and DIPs with good range of motion with no synovitis.  Hip joints, knee joints, ankles, MTPs and PIPs with good range of motion with no synovitis.  CDAI Exam: CDAI Score: 0  Patient Global: 0 mm; Provider Global: 0 mm Swollen: 0 ; Tender: 0  Joint Exam 04/03/2020   No joint exam has been documented for this visit   There is currently no information documented on the homunculus. Go to the Rheumatology activity and complete the homunculus joint exam.  Investigation: No additional findings.  Imaging: No results found.  Recent Labs: Lab Results  Component Value Date   WBC 5.5 03/28/2020   HGB 13.9 03/28/2020   PLT 303 03/28/2020   NA 140  03/28/2020   K 4.8 03/28/2020   CL 105 03/28/2020   CO2 28 03/28/2020   GLUCOSE 102 (H) 03/28/2020   BUN 8 03/28/2020   CREATININE 0.71 03/28/2020   BILITOT 0.6 03/28/2020   ALKPHOS 64 10/19/2018   AST 20 03/28/2020   ALT 12 03/28/2020   PROT 6.3 03/28/2020   ALBUMIN 4.3 10/19/2018   CALCIUM 9.4 03/28/2020   GFRAA 97 03/28/2020   QFTBGOLDPLUS NEGATIVE 10/06/2017    Speciality Comments: Osteoporosis managed by Dr. Audie Box.  Procedures:  No procedures performed Allergies: Codeine and Morphine and related   Assessment / Plan:     Visit Diagnoses: Scleroderma, limited (HCC) - Hx sclerodactyly, Raynauds, arthralgias, ILD, +ANA, +Ro, +La, -RF.  She continues to have sclerodactyly without any progression of the skin tightness.  Her Raynaud's symptoms are mild.  She has few telangiectasias.  She denies any sicca symptoms.  Chronic inflammatory arthritis - RF-, CCP-, 14-3-3 eta negative, R wrist synovitis, erosive changes, intercarpal and radiocarpal joint space narrowing.  She does not have any synovitis on my examination.  Her symptoms are well controlled on Imuran.  Interstitial lung  disease (HCC) - followed by Dr. Marchelle Gearing and Dietrich Pates.  According to Dr. Consuelo Pandy notes her ILD is stable.  She has been followed by cardiologist as well.  High risk medication use - Imuran 75 mg po daily. Off PLQ due to Macular Degenration Concern.  Her labs have been stable.  Last labs were in February 2022.  Absolute lymphocyte count is in the 700s.  Patient would like to decrease the Imuran dose as she has not noticed any joint pain or swelling.  I will discuss this further with Dr. Marchelle Gearing.  If he is in agreement then we can decrease Imuran to 50 mg p.o. daily. I texted Dr. Marchelle Gearing and he was in agreement to decrease Imuran to 50 mg p.o. daily. We will notify patient.  TPMT intermediate metabolizer (HCC)  Raynaud's disease without gangrene-not very active.  Primary osteoarthritis of both hands-she has DIP and PIP thickening.  Joint protection was discussed.  History of repair of right rotator cuff-she had good range of motion without discomfort.  Primary osteoarthritis of both knees-she has off-and-on discomfort.  She had no warmth swelling or effusion today.  S/P arthroscopy of right knee  History of gastroesophageal reflux (GERD)-she has not had reflux symptoms in a long time.  History of colon cancer  Osteopenia of multiple sites-she states that her PCP discussed starting her on Fosamax.  I do not have any concerns about her starting on Fosamax.  I discussed that it is possible sometimes it can make reflux symptoms worse.  It will need to be followed closely.  Use of calcium vitamin D and resistive exercises were discussed.  Orders: No orders of the defined types were placed in this encounter.  No orders of the defined types were placed in this encounter.  .  Follow-Up Instructions: Return in about 5 months (around 08/31/2020) for Scleroderma, Rheumatoid arthritis, ILD.   Pollyann Savoy, MD  Note - This record has been created using Animal nutritionist.  Chart creation  errors have been sought, but may not always  have been located. Such creation errors do not reflect on  the standard of medical care.

## 2020-04-03 ENCOUNTER — Other Ambulatory Visit: Payer: Self-pay | Admitting: *Deleted

## 2020-04-03 ENCOUNTER — Other Ambulatory Visit: Payer: Self-pay

## 2020-04-03 ENCOUNTER — Encounter: Payer: Self-pay | Admitting: Rheumatology

## 2020-04-03 ENCOUNTER — Ambulatory Visit: Payer: Medicare PPO | Admitting: Rheumatology

## 2020-04-03 VITALS — BP 109/75 | HR 80 | Resp 16 | Ht 66.5 in | Wt 161.6 lb

## 2020-04-03 DIAGNOSIS — M8589 Other specified disorders of bone density and structure, multiple sites: Secondary | ICD-10-CM

## 2020-04-03 DIAGNOSIS — M349 Systemic sclerosis, unspecified: Secondary | ICD-10-CM

## 2020-04-03 DIAGNOSIS — M1990 Unspecified osteoarthritis, unspecified site: Secondary | ICD-10-CM | POA: Diagnosis not present

## 2020-04-03 DIAGNOSIS — E8889 Other specified metabolic disorders: Secondary | ICD-10-CM | POA: Diagnosis not present

## 2020-04-03 DIAGNOSIS — Z9889 Other specified postprocedural states: Secondary | ICD-10-CM

## 2020-04-03 DIAGNOSIS — Z79899 Other long term (current) drug therapy: Secondary | ICD-10-CM | POA: Diagnosis not present

## 2020-04-03 DIAGNOSIS — M0600 Rheumatoid arthritis without rheumatoid factor, unspecified site: Secondary | ICD-10-CM

## 2020-04-03 DIAGNOSIS — Z85038 Personal history of other malignant neoplasm of large intestine: Secondary | ICD-10-CM

## 2020-04-03 DIAGNOSIS — M19041 Primary osteoarthritis, right hand: Secondary | ICD-10-CM

## 2020-04-03 DIAGNOSIS — Z8719 Personal history of other diseases of the digestive system: Secondary | ICD-10-CM

## 2020-04-03 DIAGNOSIS — M19042 Primary osteoarthritis, left hand: Secondary | ICD-10-CM

## 2020-04-03 DIAGNOSIS — M170 Bilateral primary osteoarthritis of knee: Secondary | ICD-10-CM | POA: Diagnosis not present

## 2020-04-03 DIAGNOSIS — I7300 Raynaud's syndrome without gangrene: Secondary | ICD-10-CM

## 2020-04-03 DIAGNOSIS — J849 Interstitial pulmonary disease, unspecified: Secondary | ICD-10-CM | POA: Diagnosis not present

## 2020-04-03 MED ORDER — AZATHIOPRINE 50 MG PO TABS: 50.0000 mg | ORAL_TABLET | Freq: Every day | ORAL | 0 refills | Status: DC

## 2020-04-03 NOTE — Telephone Encounter (Signed)
Dr. Estanislado Pandy reached out to Dr. Chase Caller in regards to patient's Imuran. Okay to for patient to decrease Imuran to 50 mg daily.

## 2020-04-03 NOTE — Patient Instructions (Signed)
Standing Labs We placed an order today for your standing lab work.   Please have your standing labs drawn in May and every 3 months   If possible, please have your labs drawn 2 weeks prior to your appointment so that the provider can discuss your results at your appointment.  We have open lab daily Monday through Thursday from 1:30-4:30 PM and Friday from 1:30-4:00 PM at the office of Dr. Tzirel Leonor, [facility].   Please be advised, all patients with office appointments requiring lab work will take precedents over walk-in lab work.  If possible, please come for your lab work on Monday and Friday afternoons, as you may experience shorter wait times. The office is located at [address], [address], [place], [place] [address] No appointment is necessary.   Labs are drawn by [facility]. Please bring your co-pay at the time of your lab draw.  You may receive a bill from [facility] for your lab work.  If you wish to have your labs drawn at another location, please call the office 24 hours in advance to send orders.  If you have any questions regarding directions or hours of operation,  please call [phone].   As a reminder, please drink plenty of water prior to coming for your lab work. Thanks!   

## 2020-04-05 ENCOUNTER — Ambulatory Visit: Payer: Medicare PPO | Admitting: Rheumatology

## 2020-04-05 DIAGNOSIS — M8589 Other specified disorders of bone density and structure, multiple sites: Secondary | ICD-10-CM

## 2020-04-05 DIAGNOSIS — M170 Bilateral primary osteoarthritis of knee: Secondary | ICD-10-CM

## 2020-04-05 DIAGNOSIS — Z9889 Other specified postprocedural states: Secondary | ICD-10-CM

## 2020-04-05 DIAGNOSIS — Z79899 Other long term (current) drug therapy: Secondary | ICD-10-CM

## 2020-04-05 DIAGNOSIS — E8889 Other specified metabolic disorders: Secondary | ICD-10-CM

## 2020-04-05 DIAGNOSIS — Z8719 Personal history of other diseases of the digestive system: Secondary | ICD-10-CM

## 2020-04-05 DIAGNOSIS — M0600 Rheumatoid arthritis without rheumatoid factor, unspecified site: Secondary | ICD-10-CM

## 2020-04-05 DIAGNOSIS — M19041 Primary osteoarthritis, right hand: Secondary | ICD-10-CM

## 2020-04-05 DIAGNOSIS — I7300 Raynaud's syndrome without gangrene: Secondary | ICD-10-CM

## 2020-04-05 DIAGNOSIS — J849 Interstitial pulmonary disease, unspecified: Secondary | ICD-10-CM

## 2020-04-05 DIAGNOSIS — M349 Systemic sclerosis, unspecified: Secondary | ICD-10-CM

## 2020-04-05 DIAGNOSIS — Z85038 Personal history of other malignant neoplasm of large intestine: Secondary | ICD-10-CM

## 2020-04-09 DIAGNOSIS — I700 Atherosclerosis of aorta: Secondary | ICD-10-CM | POA: Diagnosis not present

## 2020-04-09 DIAGNOSIS — I10 Essential (primary) hypertension: Secondary | ICD-10-CM | POA: Diagnosis not present

## 2020-04-09 DIAGNOSIS — I7300 Raynaud's syndrome without gangrene: Secondary | ICD-10-CM | POA: Diagnosis not present

## 2020-04-09 DIAGNOSIS — E785 Hyperlipidemia, unspecified: Secondary | ICD-10-CM | POA: Diagnosis not present

## 2020-04-09 DIAGNOSIS — M349 Systemic sclerosis, unspecified: Secondary | ICD-10-CM | POA: Diagnosis not present

## 2020-04-09 DIAGNOSIS — J849 Interstitial pulmonary disease, unspecified: Secondary | ICD-10-CM | POA: Diagnosis not present

## 2020-04-09 DIAGNOSIS — E039 Hypothyroidism, unspecified: Secondary | ICD-10-CM | POA: Diagnosis not present

## 2020-04-09 DIAGNOSIS — F329 Major depressive disorder, single episode, unspecified: Secondary | ICD-10-CM | POA: Diagnosis not present

## 2020-04-09 DIAGNOSIS — R5381 Other malaise: Secondary | ICD-10-CM | POA: Diagnosis not present

## 2020-04-10 DIAGNOSIS — D649 Anemia, unspecified: Secondary | ICD-10-CM | POA: Diagnosis not present

## 2020-04-10 DIAGNOSIS — E039 Hypothyroidism, unspecified: Secondary | ICD-10-CM | POA: Diagnosis not present

## 2020-04-10 DIAGNOSIS — R5383 Other fatigue: Secondary | ICD-10-CM | POA: Diagnosis not present

## 2020-04-12 DIAGNOSIS — M79672 Pain in left foot: Secondary | ICD-10-CM | POA: Diagnosis not present

## 2020-04-25 DIAGNOSIS — H353132 Nonexudative age-related macular degeneration, bilateral, intermediate dry stage: Secondary | ICD-10-CM | POA: Diagnosis not present

## 2020-05-08 DIAGNOSIS — M79672 Pain in left foot: Secondary | ICD-10-CM | POA: Diagnosis not present

## 2020-06-07 DIAGNOSIS — M79671 Pain in right foot: Secondary | ICD-10-CM | POA: Diagnosis not present

## 2020-06-14 ENCOUNTER — Other Ambulatory Visit: Payer: Self-pay

## 2020-06-14 DIAGNOSIS — Z79899 Other long term (current) drug therapy: Secondary | ICD-10-CM

## 2020-06-15 LAB — CBC WITH DIFFERENTIAL/PLATELET
Absolute Monocytes: 573 cells/uL (ref 200–950)
Basophils Absolute: 20 cells/uL (ref 0–200)
Basophils Relative: 0.4 %
Eosinophils Absolute: 39 cells/uL (ref 15–500)
Eosinophils Relative: 0.8 %
HCT: 43 % (ref 35.0–45.0)
Hemoglobin: 13.9 g/dL (ref 11.7–15.5)
Lymphs Abs: 725 cells/uL — ABNORMAL LOW (ref 850–3900)
MCH: 31.6 pg (ref 27.0–33.0)
MCHC: 32.3 g/dL (ref 32.0–36.0)
MCV: 97.7 fL (ref 80.0–100.0)
MPV: 10.8 fL (ref 7.5–12.5)
Monocytes Relative: 11.7 %
Neutro Abs: 3543 cells/uL (ref 1500–7800)
Neutrophils Relative %: 72.3 %
Platelets: 302 10*3/uL (ref 140–400)
RBC: 4.4 10*6/uL (ref 3.80–5.10)
RDW: 12.5 % (ref 11.0–15.0)
Total Lymphocyte: 14.8 %
WBC: 4.9 10*3/uL (ref 3.8–10.8)

## 2020-06-15 LAB — COMPLETE METABOLIC PANEL WITH GFR
AG Ratio: 2 (calc) (ref 1.0–2.5)
ALT: 13 U/L (ref 6–29)
AST: 18 U/L (ref 10–35)
Albumin: 4.3 g/dL (ref 3.6–5.1)
Alkaline phosphatase (APISO): 64 U/L (ref 37–153)
BUN: 10 mg/dL (ref 7–25)
CO2: 29 mmol/L (ref 20–32)
Calcium: 10 mg/dL (ref 8.6–10.4)
Chloride: 105 mmol/L (ref 98–110)
Creat: 0.71 mg/dL (ref 0.60–0.93)
GFR, Est African American: 97 mL/min/{1.73_m2} (ref >=60)
GFR, Est Non African American: 83 mL/min/{1.73_m2} (ref >=60)
Globulin: 2.2 g/dL (calc) (ref 1.9–3.7)
Glucose, Bld: 92 mg/dL (ref 65–99)
Potassium: 4.3 mmol/L (ref 3.5–5.3)
Sodium: 140 mmol/L (ref 135–146)
Total Bilirubin: 0.4 mg/dL (ref 0.2–1.2)
Total Protein: 6.5 g/dL (ref 6.1–8.1)

## 2020-06-21 DIAGNOSIS — M25572 Pain in left ankle and joints of left foot: Secondary | ICD-10-CM | POA: Diagnosis not present

## 2020-06-21 DIAGNOSIS — M79672 Pain in left foot: Secondary | ICD-10-CM | POA: Diagnosis not present

## 2020-06-27 ENCOUNTER — Other Ambulatory Visit: Payer: Self-pay | Admitting: Orthopedic Surgery

## 2020-06-27 DIAGNOSIS — M79672 Pain in left foot: Secondary | ICD-10-CM

## 2020-06-27 DIAGNOSIS — M25572 Pain in left ankle and joints of left foot: Secondary | ICD-10-CM

## 2020-07-08 ENCOUNTER — Ambulatory Visit
Admission: RE | Admit: 2020-07-08 | Discharge: 2020-07-08 | Disposition: A | Discharge status: Home / Self Care | Payer: Medicare PPO | Source: Ambulatory Visit | Attending: Orthopedic Surgery | Admitting: Orthopedic Surgery

## 2020-07-08 ENCOUNTER — Other Ambulatory Visit: Payer: Self-pay

## 2020-07-08 DIAGNOSIS — M79672 Pain in left foot: Secondary | ICD-10-CM

## 2020-07-08 DIAGNOSIS — M19072 Primary osteoarthritis, left ankle and foot: Secondary | ICD-10-CM | POA: Diagnosis not present

## 2020-07-08 DIAGNOSIS — R600 Localized edema: Secondary | ICD-10-CM | POA: Diagnosis not present

## 2020-07-08 DIAGNOSIS — M898X7 Other specified disorders of bone, ankle and foot: Secondary | ICD-10-CM | POA: Diagnosis not present

## 2020-07-08 DIAGNOSIS — M25572 Pain in left ankle and joints of left foot: Secondary | ICD-10-CM

## 2020-07-12 DIAGNOSIS — M79672 Pain in left foot: Secondary | ICD-10-CM | POA: Diagnosis not present

## 2020-07-18 ENCOUNTER — Other Ambulatory Visit: Payer: Self-pay | Admitting: Physician Assistant

## 2020-07-18 NOTE — Telephone Encounter (Signed)
Next Visit: 09/05/2020  Last Visit: 04/03/2020  Last Fill: 04/03/2020  DX: Scleroderma, limited   Current Dose per office note 04/03/2020: decrease Imuran to 50 mg p.o. daily  Labs: 06/14/2020 Absolute lymphocytes remain low but are stable. WBC count is WNL. Rest of CBC WNL. CMP WNL.   Okay to refill Imuran?

## 2020-07-25 DIAGNOSIS — D2261 Melanocytic nevi of right upper limb, including shoulder: Secondary | ICD-10-CM | POA: Diagnosis not present

## 2020-07-25 DIAGNOSIS — Z85828 Personal history of other malignant neoplasm of skin: Secondary | ICD-10-CM | POA: Diagnosis not present

## 2020-07-25 DIAGNOSIS — D2272 Melanocytic nevi of left lower limb, including hip: Secondary | ICD-10-CM | POA: Diagnosis not present

## 2020-07-25 DIAGNOSIS — L821 Other seborrheic keratosis: Secondary | ICD-10-CM | POA: Diagnosis not present

## 2020-07-25 DIAGNOSIS — L820 Inflamed seborrheic keratosis: Secondary | ICD-10-CM | POA: Diagnosis not present

## 2020-07-25 DIAGNOSIS — L814 Other melanin hyperpigmentation: Secondary | ICD-10-CM | POA: Diagnosis not present

## 2020-07-25 DIAGNOSIS — D485 Neoplasm of uncertain behavior of skin: Secondary | ICD-10-CM | POA: Diagnosis not present

## 2020-07-25 DIAGNOSIS — D2262 Melanocytic nevi of left upper limb, including shoulder: Secondary | ICD-10-CM | POA: Diagnosis not present

## 2020-08-22 NOTE — Progress Notes (Signed)
Office Visit Note  Patient: Lindsay Herman             Date of Birth: January 21, 1946           MRN: [id number]             PCP: Burnard Bunting, MD Referring: Burnard Bunting, MD Visit Date: 09/05/2020 Occupation: @GUAROCC @  Subjective:  Medication monitoring   History of Present Illness: Lindsay Herman is a 75 y.o. female with a history of limited systemic sclerosis, osteoarthritis and interstitial lung disease.  She states that her right middle finger has been painful which she describes over the DIP joint.  She states that the pain is intermittent.  None of the other joints are painful.  She has not noticed any increased skin tightness.  She denies any increased shortness of breath..  Activities of Daily Living:  Patient reports morning stiffness for 0  none .   Patient Denies nocturnal pain.  Difficulty dressing/grooming: Denies Difficulty climbing stairs: Denies Difficulty getting out of chair: Denies Difficulty using hands for taps, buttons, cutlery, and/or writing: Denies  Review of Systems  Constitutional:  Positive for fatigue. Negative for night sweats, weight gain and weight loss.  HENT:  Negative for mouth sores, trouble swallowing, trouble swallowing, mouth dryness and nose dryness.   Eyes:  Negative for pain, redness, visual disturbance and dryness.  Respiratory:  Negative for cough, shortness of breath and difficulty breathing.   Cardiovascular:  Negative for chest pain, palpitations, hypertension, irregular heartbeat and swelling in legs/feet.  Gastrointestinal:  Negative for blood in stool, constipation and diarrhea.  Endocrine: Positive for increased urination.  Genitourinary:  Negative for difficulty urinating and vaginal dryness.  Musculoskeletal:  Positive for joint pain, joint pain and joint swelling. Negative for myalgias, muscle weakness, morning stiffness, muscle tenderness and myalgias.  Skin:  Positive for color change and skin tightness. Negative for  rash, hair loss, ulcers and sensitivity to sunlight.  Allergic/Immunologic: Negative for susceptible to infections.  Neurological:  Negative for dizziness, numbness, memory loss, night sweats and weakness.  Hematological:  Negative for bruising/bleeding tendency and swollen glands.  Psychiatric/Behavioral:  Negative for depressed mood and sleep disturbance. The patient is not nervous/anxious.    PMFS History:  Patient Active Problem List   Diagnosis Date Noted   Acute medial meniscal tear, left, initial encounter 01/14/2019   Chondromalacia, left knee 01/14/2019   Loose body of left knee 01/14/2019   Scleroderma, limited ([place]) 02/10/2016   History of environmental allergies 02/10/2016   Gastroesophageal reflux disease 02/10/2016   History of colon cancer 02/10/2016   Sicca syndrome 02/01/2016   History of repair of right rotator cuff 02/01/2016   Primary osteoarthritis of both knees 02/01/2016   Primary osteoarthritis of both hands 02/01/2016   Raynaud's disease without gangrene 02/01/2016   Cystocele    Atrophic vaginitis    Osteopenia    Menopausal symptoms     Past Medical History:  Diagnosis Date   Atherosclerosis    aorta and coronary artery   Atrophic vaginitis    Cancer ([place]) 1995   Colon cancer tx surgery and chemo   Contact lens/glasses fitting    wears contacts or glasses   Cystocele    Nasal fracture    Osteopenia 06/2017   T score -2.0 FRAX 13% / 2.8%   PONV (postoperative nausea and vomiting)    Raynaud's disease    Rectocele    Rosacea    Scleroderma ([place])  Urinary frequency     Family History  Problem Relation Age of Onset   Diabetes Mother    Heart disease Mother    Stroke Mother    Diabetes Brother    Hypertension Brother    Lupus Father    Past Surgical History:  Procedure Laterality Date   APPENDECTOMY     colon cancer  1995   COLON RESECTION  1995   KNEE ARTHROSCOPY Left 01/14/2019   Procedure: ARTHROSCOPY KNEE, PARTIAL MENISCECTOMY,  CHONDROPLASTY AND LOOSE BODY REMOVAL;  Surgeon: Dorna Leitz, MD;  Location: [facility];  Service: Orthopedics;  Laterality: Left;   MASS EXCISION Left 08/31/2012   Procedure: EXCISION MUCOID CYST DEBRIDEMENT DISTAL INTERPHALANGEAL LEFT MIDDLE FINGER;  Surgeon: Wynonia Sours, MD;  Location: [place];  Service: Orthopedics;  Laterality: Left;   OOPHORECTOMY     LSO   ROTATOR CUFF REPAIR Right    TONSILLECTOMY     VAGINAL HYSTERECTOMY  1994   Social History   Social History Narrative   Not on file   Immunization History  Administered Date(s) Administered   Influenza Split 12/12/2010   Influenza, High Dose Seasonal PF 10/24/2017, 11/08/2018   PFIZER(Purple Top)SARS-COV-2 Vaccination 03/18/2019, 04/12/2019, 09/29/2019, 03/27/2020   Pneumococcal Conjugate-13 07/24/2014   Pneumococcal Polysaccharide-23 01/31/2009   Zoster Recombinat (Shingrix) 11/27/2017, 02/12/2018     Objective: Vital Signs: BP 106/68 (BP Location: Left Arm, Patient Position: Sitting, Cuff Size: Normal)   Pulse 83   Resp 16   Ht 5' 6.5" (1.689 m)   Wt 160 lb (72.6 kg)   BMI 25.44 kg/m    Physical Exam Vitals and nursing note reviewed.  Constitutional:      Appearance: She is well-developed.  HENT:     Head: Normocephalic and atraumatic.  Eyes:     Conjunctiva/sclera: Conjunctivae normal.  Cardiovascular:     Rate and Rhythm: Normal rate and regular rhythm.     Heart sounds: Normal heart sounds.  Pulmonary:     Effort: Pulmonary effort is normal.     Breath sounds: Normal breath sounds.     Comments: Crackles were heard in bilateral lung bases. Abdominal:     General: Bowel sounds are normal.     Palpations: Abdomen is soft.  Musculoskeletal:     Cervical back: Normal range of motion.  Lymphadenopathy:     Cervical: No cervical adenopathy.  Skin:    General: Skin is warm and dry.     Capillary Refill: Capillary refill takes less than 2 seconds.     Comments:  Sclerodactyly was noted.  Neurological:     Mental Status: She is alert and oriented to person, place, and time.  Psychiatric:        Behavior: Behavior normal.     Musculoskeletal Exam: She had good range of motion of the cervical spine without discomfort.  Shoulder joints, elbow joints, wrist joints, MCPs with good range of motion.  She has DIP and PIP thickening with no synovitis.  Hip joints, knee joints, ankles, MTPs and PIPs with good range of motion with no synovitis.  CDAI Exam: CDAI Score: -- Patient Global: --; Provider Global: -- Swollen: --; Tender: -- Joint Exam 09/05/2020   No joint exam has been documented for this visit   There is currently no information documented on the homunculus. Go to the Rheumatology activity and complete the homunculus joint exam.  Investigation: No additional findings.  Imaging: No results found.  Recent Labs: Lab Results  Component Value Date   WBC 5.5 08/31/2020   HGB 13.7 08/31/2020   PLT 309 08/31/2020   NA 141 08/31/2020   K 5.0 08/31/2020   CL 105 08/31/2020   CO2 30 08/31/2020   GLUCOSE 95 08/31/2020   BUN 11 08/31/2020   CREATININE 0.79 08/31/2020   BILITOT 0.4 08/31/2020   ALKPHOS 64 10/19/2018   AST 15 08/31/2020   ALT 9 08/31/2020   PROT 6.7 08/31/2020   ALBUMIN 4.3 10/19/2018   CALCIUM 10.0 08/31/2020   GFRAA 97 06/14/2020   QFTBGOLDPLUS NEGATIVE 10/06/2017    Speciality Comments: Osteoporosis managed by Dr. Phineas Real.  Procedures:  No procedures performed Allergies: Codeine, Morphine, and Morphine and related   Assessment / Plan:     Visit Diagnoses: Scleroderma, limited ([place]) - Hx sclerodactyly, Raynauds, arthralgias, ILD, +ANA, +Ro, +La, -RF.  She has sclerodactyly.  She had good capillary refill today.  She denies sicca symptoms.  Chronic inflammatory arthritis - RF-, CCP-, 14-3-3 eta negative, R wrist synovitis, erosive changes, intercarpal and radiocarpal joint space narrowing.  She had no active  synovitis on my examination today.  Interstitial lung disease ([place]) - followed by Dr. Chase Caller and Dorris Carnes.  She has appointment coming up this year.  High risk medication use - Imuran 50 mg po daily. Off PLQ due to Macular Degenration Concern.  Her labs from July 2022 were stable.  Lymphocyte count was low due to chronic immunosuppression.  She is advised to get labs every 3 months to monitor for drug toxicity.  She was also advised to stop Imuran and get she develops an infection and resume after infection resolves.  Information regarding immunization was also placed in the AVS.  TPMT intermediate metabolizer (HCC)  Raynaud's disease without gangrene-her symptoms are more prominent during the winter months.  Primary osteoarthritis of both hands-she is osteoarthritis in her hands with DIP thickening.  Joint protection was discussed.  History of repair of right rotator cuff-she had good range of motion.  Primary osteoarthritis of both knees -she denies any discomfort currently.  S/P arthroscopy of right knee  History of gastroesophageal reflux (GERD)-not symptomatic.  History of colon cancer  Osteopenia of multiple sites - followed by her GYN. She is on Fosamax.  Orders: No orders of the defined types were placed in this encounter.  No orders of the defined types were placed in this encounter.    Follow-Up Instructions: Return in about 5 months (around 02/05/2021) for Scleroderma.   Bo Merino, MD  Note - This record has been created using Editor, commissioning.  Chart creation errors have been sought, but may not always  have been located. Such creation errors do not reflect on  the standard of medical care.

## 2020-08-31 ENCOUNTER — Other Ambulatory Visit: Payer: Self-pay

## 2020-08-31 DIAGNOSIS — Z79899 Other long term (current) drug therapy: Secondary | ICD-10-CM

## 2020-08-31 LAB — CBC WITH DIFFERENTIAL/PLATELET
Absolute Monocytes: 770 cells/uL (ref 200–950)
Basophils Absolute: 39 cells/uL (ref 0–200)
Basophils Relative: 0.7 %
Eosinophils Absolute: 132 cells/uL (ref 15–500)
Eosinophils Relative: 2.4 %
HCT: 42.6 % (ref 35.0–45.0)
Hemoglobin: 13.7 g/dL (ref 11.7–15.5)
Lymphs Abs: 649 cells/uL — ABNORMAL LOW (ref 850–3900)
MCH: 31.5 pg (ref 27.0–33.0)
MCHC: 32.2 g/dL (ref 32.0–36.0)
MCV: 97.9 fL (ref 80.0–100.0)
MPV: 11.2 fL (ref 7.5–12.5)
Monocytes Relative: 14 %
Neutro Abs: 3911 cells/uL (ref 1500–7800)
Neutrophils Relative %: 71.1 %
Platelets: 309 10*3/uL (ref 140–400)
RBC: 4.35 10*6/uL (ref 3.80–5.10)
RDW: 13.1 % (ref 11.0–15.0)
Total Lymphocyte: 11.8 %
WBC: 5.5 10*3/uL (ref 3.8–10.8)

## 2020-08-31 LAB — COMPLETE METABOLIC PANEL WITH GFR
AG Ratio: 1.7 (calc) (ref 1.0–2.5)
ALT: 9 U/L (ref 6–29)
AST: 15 U/L (ref 10–35)
Albumin: 4.2 g/dL (ref 3.6–5.1)
Alkaline phosphatase (APISO): 59 U/L (ref 37–153)
BUN: 11 mg/dL (ref 7–25)
CO2: 30 mmol/L (ref 20–32)
Calcium: 10 mg/dL (ref 8.6–10.4)
Chloride: 105 mmol/L (ref 98–110)
Creat: 0.79 mg/dL (ref 0.60–1.00)
Globulin: 2.5 g/dL (calc) (ref 1.9–3.7)
Glucose, Bld: 95 mg/dL (ref 65–99)
Potassium: 5 mmol/L (ref 3.5–5.3)
Sodium: 141 mmol/L (ref 135–146)
Total Bilirubin: 0.4 mg/dL (ref 0.2–1.2)
Total Protein: 6.7 g/dL (ref 6.1–8.1)
eGFR: 78 mL/min/{1.73_m2} (ref >=60)

## 2020-09-01 NOTE — Progress Notes (Signed)
CBC and CMP are normal.  Lymphocyte count is low to the use of Imuran and stable.  We will continue to monitor.

## 2020-09-05 ENCOUNTER — Encounter: Payer: Self-pay | Admitting: Rheumatology

## 2020-09-05 ENCOUNTER — Other Ambulatory Visit: Payer: Self-pay

## 2020-09-05 ENCOUNTER — Ambulatory Visit: Payer: Medicare PPO | Admitting: Rheumatology

## 2020-09-05 VITALS — BP 106/68 | HR 83 | Resp 16 | Ht 66.5 in | Wt 160.0 lb

## 2020-09-05 DIAGNOSIS — Z85038 Personal history of other malignant neoplasm of large intestine: Secondary | ICD-10-CM

## 2020-09-05 DIAGNOSIS — I7300 Raynaud's syndrome without gangrene: Secondary | ICD-10-CM

## 2020-09-05 DIAGNOSIS — Z79899 Other long term (current) drug therapy: Secondary | ICD-10-CM

## 2020-09-05 DIAGNOSIS — M19042 Primary osteoarthritis, left hand: Secondary | ICD-10-CM

## 2020-09-05 DIAGNOSIS — M1990 Unspecified osteoarthritis, unspecified site: Secondary | ICD-10-CM | POA: Diagnosis not present

## 2020-09-05 DIAGNOSIS — Z9889 Other specified postprocedural states: Secondary | ICD-10-CM

## 2020-09-05 DIAGNOSIS — M170 Bilateral primary osteoarthritis of knee: Secondary | ICD-10-CM | POA: Diagnosis not present

## 2020-09-05 DIAGNOSIS — M19041 Primary osteoarthritis, right hand: Secondary | ICD-10-CM

## 2020-09-05 DIAGNOSIS — J849 Interstitial pulmonary disease, unspecified: Secondary | ICD-10-CM

## 2020-09-05 DIAGNOSIS — Z8719 Personal history of other diseases of the digestive system: Secondary | ICD-10-CM

## 2020-09-05 DIAGNOSIS — E8889 Other specified metabolic disorders: Secondary | ICD-10-CM | POA: Diagnosis not present

## 2020-09-05 DIAGNOSIS — M349 Systemic sclerosis, unspecified: Secondary | ICD-10-CM | POA: Diagnosis not present

## 2020-09-05 DIAGNOSIS — M8589 Other specified disorders of bone density and structure, multiple sites: Secondary | ICD-10-CM

## 2020-09-05 NOTE — Patient Instructions (Signed)
Standing Labs We placed an order today for your standing lab work.   Please have your standing labs drawn in October and every 3 months  If possible, please have your labs drawn 2 weeks prior to your appointment so that the provider can discuss your results at your appointment.  Please note that you may see your imaging and lab results in [facility] before we have reviewed them. We may be awaiting multiple results to interpret others before contacting you. Please allow our office up to 72 hours to thoroughly review all of the results before contacting the office for clarification of your results.  We have open lab daily: Monday through Thursday from 1:30-4:30 PM and Friday from 1:30-4:00 PM at the office of Dr. Bo Merino, [facility].   Please be advised, all patients with office appointments requiring lab work will take precedent over walk-in lab work.  If possible, please come for your lab work on Monday and Friday afternoons, as you may experience shorter wait times. The office is located at [address] [place], [place], [place] [address] No appointment is necessary.   Labs are drawn by [facility]. Please bring your co-pay at the time of your lab draw.  You may receive a bill from [facility] for your lab work.  If you wish to have your labs drawn at another location, please call the office 24 hours in advance to send orders.  If you have any questions regarding directions or hours of operation,  please call [phone].   As a reminder, please drink plenty of water prior to coming for your lab work. Thanks!   If you test POSITIVE for COVID19 and have MILD to MODERATE symptoms: First, call your PCP if you would like to receive COVID19 treatment AND Hold your medications during the infection and for at least 1 week after your symptoms have resolved: Injectable medication (Benlysta, Cimzia, Cosentyx, Enbrel, Humira, Orencia, Remicade, Simponi, Stelara, Taltz,  Tremfya) Methotrexate Leflunomide (Arava) Azathioprine Mycophenolate (Cellcept) Roma Kayser, or Rinvoq Otezla If you take Actemra or Kevzara, you DO NOT need to hold these for COVID19 infection.  If you test POSITIVE for COVID19 and have NO symptoms: First, call your PCP if you would like to receive COVID19 treatment AND Hold your medications for at least 10 days after the day that you tested positive Injectable medication (Benlysta, Cimzia, Cosentyx, Enbrel, Humira, Orencia, Remicade, Simponi, Stelara, Taltz, Tremfya) Methotrexate Leflunomide (Arava) Azathioprine Mycophenolate (Cellcept) Roma Kayser, or Rinvoq Otezla If you take Actemra or Kevzara, you DO NOT need to hold these for COVID19 infection.  If you have signs or symptoms of an infection or start antibiotics: First, call your PCP for workup of your infection. Hold your medication through the infection, until you complete your antibiotics, and until symptoms resolve if you take the following: Injectable medication (Actemra, Benlysta, Cimzia, Cosentyx, Enbrel, Humira, Kevzara, Orencia, Remicade, Simponi, Stelara, Taltz, Tremfya) Methotrexate Leflunomide (Arava) Mycophenolate (Cellcept) Morrie Sheldon, Olumiant, or Rinvoq  Vaccines You are taking a medication(s) that can suppress your immune system.  The following immunizations are recommended: Flu annually Covid-19  Td/Tdap (tetanus, diphtheria, pertussis) every 10 years Pneumonia (Prevnar 15 then Pneumovax 23 at least 1 year apart.  Alternatively, can take Prevnar 20 without needing additional dose) Shingrix (after age 4): 2 doses from 4 weeks to 6 months apart  Please check with your PCP to make sure you are up to date.  Heart Disease Prevention   Your inflammatory disease increases your risk of heart  disease which includes heart attack, stroke, atrial fibrillation (irregular heartbeats), high blood pressure, heart failure and atherosclerosis (plaque in the  arteries).  It is important to reduce your risk by:   Keep blood pressure, cholesterol, and blood sugar at healthy levels   Smoking Cessation   Maintain a healthy weight  BMI 20-25   Eat a healthy diet  Plenty of fresh fruit, vegetables, and whole grains  Limit saturated fats, foods high in sodium, and added sugars  DASH and Mediterranean diet   Increase physical activity  Recommend moderate physically activity for 150 minutes per week/ 30 minutes a day for five days a week These can be broken up into three separate ten-minute sessions during the day.   Reduce Stress  Meditation, slow breathing exercises, yoga, coloring books  Dental visits twice a year

## 2020-09-19 ENCOUNTER — Other Ambulatory Visit: Payer: Self-pay | Admitting: *Deleted

## 2020-09-19 DIAGNOSIS — J849 Interstitial pulmonary disease, unspecified: Secondary | ICD-10-CM

## 2020-09-19 NOTE — Progress Notes (Signed)
Pft ordered  

## 2020-09-20 ENCOUNTER — Encounter: Payer: Self-pay | Admitting: Internal Medicine

## 2020-09-20 ENCOUNTER — Ambulatory Visit (INDEPENDENT_AMBULATORY_CARE_PROVIDER_SITE_OTHER): Payer: Medicare PPO | Admitting: Internal Medicine

## 2020-09-20 ENCOUNTER — Other Ambulatory Visit: Payer: Self-pay

## 2020-09-20 ENCOUNTER — Ambulatory Visit: Payer: Medicare PPO | Admitting: Internal Medicine

## 2020-09-20 VITALS — BP 106/70 | HR 91 | Temp 98.0°F | Ht 67.0 in | Wt 161.0 lb

## 2020-09-20 DIAGNOSIS — J849 Interstitial pulmonary disease, unspecified: Secondary | ICD-10-CM

## 2020-09-20 DIAGNOSIS — R490 Dysphonia: Secondary | ICD-10-CM | POA: Diagnosis not present

## 2020-09-20 DIAGNOSIS — M349 Systemic sclerosis, unspecified: Secondary | ICD-10-CM

## 2020-09-20 DIAGNOSIS — J8489 Other specified interstitial pulmonary diseases: Secondary | ICD-10-CM | POA: Diagnosis not present

## 2020-09-20 DIAGNOSIS — M359 Systemic involvement of connective tissue, unspecified: Secondary | ICD-10-CM | POA: Diagnosis not present

## 2020-09-20 LAB — PULMONARY FUNCTION TEST
DL/VA % pred: 114 %
DL/VA: 4.6 ml/min/mmHg/L
DLCO cor % pred: 99 %
DLCO cor: 20.95 ml/min/mmHg
DLCO unc % pred: 100 %
DLCO unc: 21.14 ml/min/mmHg
FEF 25-75 Pre: 4.33 L/s
FEF2575-%Pred-Pre: 236 %
FEV1-%Pred-Pre: 111 %
FEV1-Pre: 2.67 L
FEV1FVC-%Pred-Pre: 121 %
FEV6-%Pred-Pre: 96 %
FEV6-Pre: 2.93 L
FEV6FVC-%Pred-Pre: 104 %
FVC-%Pred-Pre: 92 %
FVC-Pre: 2.95 L
Pre FEV1/FVC ratio: 91 %
Pre FEV6/FVC Ratio: 100 %

## 2020-09-20 NOTE — Progress Notes (Signed)
Spirometry and DLCO performed today. °

## 2020-09-20 NOTE — Patient Instructions (Addendum)
ICD-10-CM   1. Interstitial lung disease due to connective tissue disease ([place])  J84.89    M35.9     2. Scleroderma ([place])  M34.9     3. Hoarse voice quality  R49.0      You have interstitial lung disease secondary to scleroderma. -  mild and is also stable between March 2018 ->  October 2019 -> Sept 2020 ->July 2021 -> august 2022. Though In august 2022 one part of your breathing test ? Technical issues might suggest decline - can unpredictably become worse  Unclear reason for hoarse voice   Plan -At this time I recommend you continue Imuran that was prescribed by Dr. Keturah Barre for your joint pain - I do not recommend adding anti-fibrotics at this time given stability of disease and GI side effects with this drug in context of prior colectomy - If you have intolerance to Imuran or  progression of fibrosis then we can add nintedanib or discus injection actemra - refer ENT -  We will continue to monitor your situation with serial clinical visits lung function and flexible approach to CT scan   Follow-up -In 6 to 9  months do spirometry and DLCO test -Return to ILD clinic with Dr Chase Caller after above in 6 to 9 months but after above tests  - 30 min

## 2020-09-20 NOTE — Patient Instructions (Signed)
Spirometry/DLCO performed today. 

## 2020-09-20 NOTE — Progress Notes (Signed)
OV 11/10/2017  Background history followed by Dr. Estanislado Pandy - Scleroderma, limited ([place]) - Hx sclerodactyly, Raynauds, arthralgias, +ANA, +Ro, +La, +RF ; Off PLQ due to Macular Degenration Concern.  - Raynaud's disease without gangrene-it is not very symptomatic currently.  - Sicca syndrome-she continues to have dry mouth and dry eyes.  The symptoms are tolerable.  - Primary osteoarthritis of both hands-she has some changes consistent with osteoarthritis. - Antibiodies - 10/06/17   - neg CCP and RA   Current history -75 year old lady with the above medical problems.  Known to have scleroderma for at least 10 years according to history.  She tells me that she has had insidious onset of shortness of breath for heavy exertion after doing long walks for a few to several years.  It is stable and has not changed.  She thought this was because of aging.  She does not have scleroderma.  She had pulmonary function test in the past but she does not know the result.  Looking at it it shows reduction in diffusion capacity.  Most recently based on the history and review of rheumatology charts it appears that she started having arthritis in her forearm.  This was in August 2019.  Rheumatoid arthritis work-up was negative on serology.  However because the arthritis was felt to be inflammatory methotrexate was considered.  Because of her previous abnormal pulmonary function test high-resolution CT chest was obtained and it showed probable UIP pattern ILD.  I personally visualized the CT chest and agree with those findings.  Repeat pulmonary function test showed stability.  Therefore she has been referred here.  In the phone conversation with Dr. Keturah Barre I was okay with patient starting Imuran against both arthritis and ILD.  At this point that she is feeling stable.  We asked her to do interstitial lung disease questionnaire.  We sent this to her to her house but she forgot to bring it.  Walking desaturation test was  normal without any desaturations.    [facility] Comprehensive ILD Questionnaire  -Symptoms: She has level 3 dyspnea for walking up stairs or walking up a hill but otherwise does not have dyspnea.  She does not cough does not have a  Past medical history: She is occult positive for rheumatoid arthritis for a few weeks.  She she circled positive for scleroderma limited since 2010 but otherwise negative  Review of systems: Positive for fatigue at times for few years.  Arthralgia for several years.  She has had Raynud for several years.  She says after she retired and not having to rush to school and not being exposed to the outdoor air in the winter it is actually much better and not noticeable.  She does have some snoring but it is controlled with Afrin nasal spray.  Personal exposure history: Started smoking 1968 quit in 1974.  Smoked 8 cigarettes a day.  She has lived with a smoker but denies any use of cigars or marijuana cocaine intravenous drug use of vaping products.  Home exposure history: Single-family home suburban 33 years in the same home age of the home of 72 years  Occupational history: 122 questionnaires positive first living in a condition spaces but otherwise negative.  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 ,  MRN: [id number] , ADDRESS: [place] [address]   10/27/2018 -   Chief Complaint  Patient presents with   ILD (interstitial lung disease)    Discuss results of PFT     ICD-10-CM   1. ILD (interstitial lung disease) ([place])  J84.9   2. Scleroderma ([place])  M34.9      HPI Lindsay Herman 75 y.o. -presents for follow-up of interstitial lung disease secondary to scleroderma.  Since her last visit approximately a year ago she continues to be stable.  Symptom  scores are mild.  SHe is on Imuran through Dr Bo Merino.  She reports no new complaints.  She is social distancing quite well.  She had questions about monitoring of her disease and future therapy.  She had pulmonary function test today that shows continued stability.  Her current symptom scores are mild.    OV 09/08/2019   Subjective:  Patient ID: Lindsay Herman, female , DOB: 08/23/1945, age 75 y.o. years. , MRN: [id number],  ADDRESS: [place] [address] PCP  Burnard Bunting, MD Providers : Treatment Team:  Attending Provider: Brand Males, MD   Chief Complaint  Patient presents with   Follow-up    Pt states she has been doing good since last visit and states she feels like she has been stable. Pt denies any complaints with her breathing.       HPI Lindsay Herman 75 y.o. -follow-up for interstitial lung disease secondary to scleroderma on Imuran.  She has mild stable interstitial lung disease.  Last seen in September 2020.  Since then she continues to be stable.  Symptom scores are listed below and extremely mild.  Infectious somewhat better than before.  She continues on Imuran.  Because of stability we discussed and she would not take antifibrotic's.  Also she has a colectomy.  She tells me that [name] who is a patient of mine now disease from scleroderma ILD was a neighboring) for 44 years.  She is thankful for my care to her.  She does not have any chest pain.  She had a high-resolution CT chest and this documents continued stability of her probably UIP pattern of ILD.  She has scheduled PFT test today but given stability of her CT and symptoms wondering if we can skip it - per HPI  IMPRESSION: 1. There is mild, bibasilar predominant pulmonary fibrosis in a pattern featuring mild tubular bronchiectasis, irregular peripheral interstitial and ground-glass opacity and subtle areas of bronchiolectasis in the lung bases. Fibrotic findings are  not significantly changed compared to prior examination dated 10/19/2017. Findings remain most consistent with a "probable UIP" pattern of fibrosis by ATS pulmonary fibrosis criteria and are potentially consistent with a connective tissue disorder related fibrosis given reported history of scleroderma. Findings are categorized as probable UIP per consensus guidelines: Diagnosis of Idiopathic Pulmonary Fibrosis: An Official ATS/ERS/JRS/ALAT Clinical Practice Guideline. [place], Iss 5, [phone], Oct 11 2016. 2. No CT abnormality of the esophagus given reported history of scleroderma. 3. Cholelithiasis.     Electronically Signed   By: Eddie Candle M.D.   On: 09/01/2019 13:56   ROS - per HPI  OV 09/20/2020  Subjective:  Patient ID: Lindsay Herman, female , DOB: Sep 15, 1945 , age 75 y.o. , MRN: [id number] , ADDRESS: [place] [address] PCP Burnard Bunting, MD Patient Care Team: Burnard Bunting, MD as PCP - General (Internal Medicine) Harrington Challenger,  Carmin Muskrat, MD as PCP - Cardiology (Cardiology)  This Provider for this visit: Treatment Team:  Attending Provider: Brand Males, MD    09/20/2020 -   Chief Complaint  Patient presents with   Follow-up    PFT performed today.  Pt states she has been doing okay since last visit. States she does have some hoarseness first thing in the morning.    interstitial lung disease secondary to scleroderma on Imuran Hx of Colectomy  HPI Lindsay Herman 75 y.o. -returns for her annual follow-up.  She says in the last 1 year respiratory status is stable.  ILD symptom score is 3 in the past it is fluctuated between 1 and 6 so she is in the middle.  There is no cough or any other issue.  However she does have mild hoarseness of voice for the last 1 year and it did get worse.  It is then on and off.  She has never seen ENT.  She is a current non-smoker but a former smoker   She continues on Imuran.  She is  worried about the fact she is immunosuppressed.  She social distances and masks.  Her pulmonary function test shows stability DLCO but her FVC is lower.  She thinks it was a technique issue.  Her walking desaturation test and symptom score is stable.     SYMPTOM SCALE - ILD 10/27/2018  09/08/2019  09/20/2020   O2 use ra ra ra  Shortness of Breath symptoms 0 -> 5 scale with 5 being worst (score 6 If unable to do) 0   At rest 0 0 0  Simple tasks - showers, clothes change, eating, shaving 0 0 0  Household (dishes, doing bed, laundry) 1 0 1  Shopping 1 0 0  Walking level at own pace 1 0 1  Walking up Stairs '3 1 1  '$ Total (40 - 48) Dyspnea Score '6 1 3  '$ How bad is your cough? 0 0 0  How bad is your fatigue 3 1 0  nausea  0 0  vomit  0 0  diarrhea  0 0  anzity  0 0  depression  0 0     Simple office walk 185 feet x  3 laps goal with forehead probe 09/08/2019 Done with mask 09/20/2020   O2 used ra ra  Number laps completed 3 3  Comments about pace moderate brisk  Resting Pulse Ox/HR 96% and 91/min 100% and 91%  Final Pulse Ox/HR 93% and 116/min 99% and 121  Desaturated </= 88% no no  Desaturated <= 3% points yes no  Got Tachycardic >/= 90/min yes yes  Symptoms at end of test Moderate dyspnea due to mask Mild dyspnea  Miscellaneous comments x      IMPRESSION: HRCT 1. There is mild, bibasilar predominant pulmonary fibrosis in a pattern featuring mild tubular bronchiectasis, irregular peripheral interstitial and ground-glass opacity and subtle areas of bronchiolectasis in the lung bases. Fibrotic findings are not significantly changed compared to prior examination dated 10/19/2017. Findings remain most consistent with a "probable UIP" pattern of fibrosis by ATS pulmonary fibrosis criteria and are potentially consistent with a connective tissue disorder related fibrosis given reported history of scleroderma. Findings are categorized as probable UIP per consensus guidelines:  Diagnosis of Idiopathic Pulmonary Fibrosis: An Official ATS/ERS/JRS/ALAT Clinical Practice Guideline. [place], Iss 5, [phone], Oct 11 2016. 2. No CT abnormality of the esophagus given reported history of scleroderma. 3. Cholelithiasis.  Electronically Signed   By: Eddie Candle M.D.   On: 09/01/2019 13:56   PFT  PFT Results Latest Ref Rng & Units 09/20/2020 10/27/2018 11/10/2017 04/16/2016  FVC-Pre L 2.95 3.28 3.22 3.14  FVC-Predicted Pre % 92 100 98 94  FVC-Post L - - 3.23 3.14  FVC-Predicted Post % - - 98 94  Pre FEV1/FVC % % 91 88 90 91  Post FEV1/FCV % % - - 88 93  FEV1-Pre L 2.67 2.89 2.89 2.87  FEV1-Predicted Pre % 111 117 117 113  FEV1-Post L - - 2.85 2.92  DLCO uncorrected ml/min/mmHg 21.14 19.71 18.84 20.08  DLCO UNC% % 100 92 66 70  DLCO corrected ml/min/mmHg 20.95 - - -  DLCO COR %Predicted % 99 - - -  DLVA Predicted % 114 120 81 79  TLC L - - 5.04 5.41  TLC % Predicted % - - 91 98  RV % Predicted % - - 82 99       has a past medical history of Atherosclerosis, Atrophic vaginitis, Cancer ([place]) (1995), Contact lens/glasses fitting, Cystocele, Nasal fracture, Osteopenia (06/2017), PONV (postoperative nausea and vomiting), Raynaud's disease, Rectocele, Rosacea, Scleroderma ([place]), and Urinary frequency.   reports that she quit smoking about 48 years ago. Her smoking use included cigarettes. She started smoking about 57 years ago. She has a 4.50 pack-year smoking history. She has never used smokeless tobacco.  Past Surgical History:  Procedure Laterality Date   APPENDECTOMY     colon cancer  1995   COLON RESECTION  1995   KNEE ARTHROSCOPY Left 01/14/2019   Procedure: ARTHROSCOPY KNEE, PARTIAL MENISCECTOMY, CHONDROPLASTY AND LOOSE BODY REMOVAL;  Surgeon: Dorna Leitz, MD;  Location: [facility];  Service: Orthopedics;  Laterality: Left;   MASS EXCISION Left 08/31/2012   Procedure: EXCISION MUCOID CYST DEBRIDEMENT DISTAL  INTERPHALANGEAL LEFT MIDDLE FINGER;  Surgeon: Wynonia Sours, MD;  Location: [facility];  Service: Orthopedics;  Laterality: Left;   OOPHORECTOMY     LSO   ROTATOR CUFF REPAIR Right    TONSILLECTOMY     VAGINAL HYSTERECTOMY  1994    Allergies  Allergen Reactions   Codeine Nausea And Vomiting   Morphine     Other reaction(s): Unknown   Morphine And Related Nausea And Vomiting    Immunization History  Administered Date(s) Administered   Influenza Split 12/12/2010   Influenza, High Dose Seasonal PF 10/24/2017, 11/08/2018   PFIZER(Purple Top)SARS-COV-2 Vaccination 03/18/2019, 04/12/2019, 09/29/2019, 03/27/2020   Pneumococcal Conjugate-13 07/24/2014   Pneumococcal Polysaccharide-23 01/31/2009   Zoster Recombinat (Shingrix) 11/27/2017, 02/12/2018    Family History  Problem Relation Age of Onset   Diabetes Mother    Heart disease Mother    Stroke Mother    Diabetes Brother    Hypertension Brother    Lupus Father      Current Outpatient Medications:    azaTHIOprine (IMURAN) 50 MG tablet, Take 1 tablet (50 mg total) by mouth daily., Disp: 90 tablet, Rfl: 0   Calcium Carb-Cholecalciferol (CALCIUM 500 + D) 500-200 MG-UNIT TABS, one po qd., Disp: , Rfl:    Coenzyme Q10 (COQ10) 200 MG CAPS, Take by mouth daily., Disp: , Rfl:    DULoxetine (CYMBALTA) 60 MG capsule, Take 1 capsule by mouth daily., Disp: , Rfl:    levothyroxine (SYNTHROID) 50 MCG tablet, Take 1 tablet by mouth every evening. , Disp: , Rfl:    MAGNESIUM CITRATE PO, Take 250 mg by mouth daily., Disp: , Rfl:  Multiple Vitamins-Minerals (PRESERVISION AREDS 2) CAPS, Take 1 capsule by mouth 2 (two) times daily., Disp: , Rfl:    MYRBETRIQ 50 MG TB24 tablet, , Disp: , Rfl:    rosuvastatin (CRESTOR) 20 MG tablet, TAKE 1 TABLET(20 MG) BY MOUTH DAILY, Disp: 90 tablet, Rfl: 3   TURMERIC PO, Take by mouth as directed., Disp: , Rfl:    Vitamin D, Cholecalciferol, 25 MCG (1000 UT) CAPS, Take 2,000 Units by mouth  daily., Disp: , Rfl:       Objective:   Vitals:   09/20/20 1334  BP: 106/70  Pulse: 91  Temp: 98 F (36.7 C)  TempSrc: Oral  SpO2: 100%  Weight: 161 lb (73 kg)  Height: '5\' 7"'$  (1.702 m)    Estimated body mass index is 25.22 kg/m as calculated from the following:   Height as of this encounter: '5\' 7"'$  (1.702 m).   Weight as of this encounter: 161 lb (73 kg).  '@WEIGHTCHANGE'$ @  [employer]   09/20/20 1334  Weight: 161 lb (73 kg)     Physical Exam  General: No distress. Looks well Neuro: Alert and Oriented x 3. GCS 15. Speech normal Psych: Pleasant Resp:  Barrel Chest - no.  Wheeze - no, Crackles -fine crackles in the lung base, No overt respiratory distress CVS: Normal heart sounds. Murmurs - no Ext: Stigmata of Connective Tissue Disease - no HEENT: Normal upper airway. PEERL +. No post nasal drip.  Does have a hoarse voice.        Assessment:       ICD-10-CM   1. Interstitial lung disease due to connective tissue disease ([place])  J84.89    M35.9     2. Scleroderma ([place])  M34.9     3. Hoarse voice quality  R49.0 Ambulatory referral to ENT         Plan:     Patient Instructions     ICD-10-CM   1. Interstitial lung disease due to connective tissue disease ([place])  J84.89    M35.9     2. Scleroderma ([place])  M34.9     3. Hoarse voice quality  R49.0      You have interstitial lung disease secondary to scleroderma. -  mild and is also stable between March 2018 ->  October 2019 -> Sept 2020 ->July 2021 -> august 2022. Though In august 2022 one part of your breathing test ? Technical issues might suggest decline - can unpredictably become worse  Unclear reason for hoarse voice   Plan -At this time I recommend you continue Imuran that was prescribed by Dr. Keturah Barre for your joint pain - I do not recommend adding anti-fibrotics at this time given stability of disease and GI side effects with this drug in context of prior colectomy - If you have intolerance to  Imuran or  progression of fibrosis then we can add nintedanib or discus injection actemra - refer ENT -  We will continue to monitor your situation with serial clinical visits lung function and flexible approach to CT scan   Follow-up -In 6 to 9  months do spirometry and DLCO test -Return to ILD clinic with Dr Chase Caller after above in 6 to 9 months but after above tests  - 11 min    SIGNATURE    Dr. Brand Males, M.D., F.C.C.P,  Pulmonary and Critical Care Medicine Staff Physician, [facility] Director - Interstitial Lung Disease  Program  Pulmonary [facility]  Pulmonary [place], [place], [address]  Pager: [phone], If no answer or between  15:00h - 7:00h: call [phone] Telephone: [phone]  2:07 PM 09/20/2020

## 2020-10-08 DIAGNOSIS — E039 Hypothyroidism, unspecified: Secondary | ICD-10-CM | POA: Diagnosis not present

## 2020-10-08 DIAGNOSIS — E785 Hyperlipidemia, unspecified: Secondary | ICD-10-CM | POA: Diagnosis not present

## 2020-10-14 ENCOUNTER — Other Ambulatory Visit: Payer: Self-pay | Admitting: Physician Assistant

## 2020-10-16 NOTE — Telephone Encounter (Signed)
Please schedule patient for a follow up visit. Patient due December 2022. Thanks!

## 2020-10-16 NOTE — Telephone Encounter (Signed)
Next Visit: Return in about 5 months (around 02/05/2021) for Scleroderma. Message sent to the front to schedule patient.   Last Visit: 09/05/2020   Last Fill: 07/18/2020   DX: Scleroderma, limited   Current Dose per office note 09/05/2020: Imuran 50 mg po daily  Labs: 08/31/2020 CBC and CMP are normal.  Lymphocyte count is low to the use of Imuran and stable.   Okay to refill Imuran?

## 2020-10-16 NOTE — Telephone Encounter (Signed)
Patient scheduled for 01/15/2021.

## 2020-10-22 DIAGNOSIS — E785 Hyperlipidemia, unspecified: Secondary | ICD-10-CM | POA: Diagnosis not present

## 2020-10-22 DIAGNOSIS — J849 Interstitial pulmonary disease, unspecified: Secondary | ICD-10-CM | POA: Diagnosis not present

## 2020-10-22 DIAGNOSIS — E039 Hypothyroidism, unspecified: Secondary | ICD-10-CM | POA: Diagnosis not present

## 2020-10-22 DIAGNOSIS — Z1331 Encounter for screening for depression: Secondary | ICD-10-CM | POA: Diagnosis not present

## 2020-10-22 DIAGNOSIS — C189 Malignant neoplasm of colon, unspecified: Secondary | ICD-10-CM | POA: Diagnosis not present

## 2020-10-22 DIAGNOSIS — Z1339 Encounter for screening examination for other mental health and behavioral disorders: Secondary | ICD-10-CM | POA: Diagnosis not present

## 2020-10-22 DIAGNOSIS — I10 Essential (primary) hypertension: Secondary | ICD-10-CM | POA: Diagnosis not present

## 2020-10-22 DIAGNOSIS — I700 Atherosclerosis of aorta: Secondary | ICD-10-CM | POA: Diagnosis not present

## 2020-10-22 DIAGNOSIS — Z0000 Encounter for general adult medical examination without abnormal findings: Secondary | ICD-10-CM | POA: Diagnosis not present

## 2020-10-22 DIAGNOSIS — R82998 Other abnormal findings in urine: Secondary | ICD-10-CM | POA: Diagnosis not present

## 2020-10-24 DIAGNOSIS — Z1212 Encounter for screening for malignant neoplasm of rectum: Secondary | ICD-10-CM | POA: Diagnosis not present

## 2020-12-10 ENCOUNTER — Ambulatory Visit: Payer: Medicare PPO | Admitting: Internal Medicine

## 2020-12-24 NOTE — Progress Notes (Signed)
Cardiology Office Note   Date:  12/25/2020   ID:  Lindsay Herman, DOB 11-Jan-1946, MRN [id number]  PCP:  Burnard Bunting, MD  Cardiologist:   Dorris Carnes, MD   F/U of coronary calcifications      History of Present Illness: Lindsay Herman is a 75 y.o. 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   Felt mild   Rx Imuran   Plan for spirometry f/u and f/u in clinic     The pt has atherosclerosis of aorta and coronary arteries as seen on CT    I saw the pt back in NOvember 2020  She is also followed in pulmonary and rheum cliinic    The ptt says she just feels tired   Has not been exercising  regularlly  Saw PCP and he recommended this to her She denies CP  No signif SOB      I saw the pt back in 2021  She says she has been doing good  No CP  breathing is OK   No dizzienss  No palpitations      Current Meds  Medication Sig   azaTHIOprine (IMURAN) 50 MG tablet TAKE 1 TABLET(50 MG) BY MOUTH DAILY   Calcium Carb-Cholecalciferol (CALCIUM 500 + D) 500-200 MG-UNIT TABS one po qd.   Coenzyme Q10 (COQ10) 200 MG CAPS Take by mouth daily.   levothyroxine (SYNTHROID) 50 MCG tablet Take 1 tablet by mouth every evening.    MAGNESIUM CITRATE PO Take 250 mg by mouth daily.   Multiple Vitamins-Minerals (PRESERVISION AREDS 2) CAPS Take 1 capsule by mouth 2 (two) times daily.   rosuvastatin (CRESTOR) 20 MG tablet TAKE 1 TABLET(20 MG) BY MOUTH DAILY   TURMERIC PO Take by mouth as directed.   Vitamin D, Cholecalciferol, 25 MCG (1000 UT) CAPS Take 2,000 Units by mouth daily.     Allergies:   Codeine, Morphine, and Morphine and related   Past  Medical History:  Diagnosis Date   Atherosclerosis    aorta and coronary artery   Atrophic vaginitis    Cancer ([facility]) 1995   Colon cancer tx surgery and chemo   Contact lens/glasses fitting    wears contacts or glasses   Cystocele    Nasal fracture    Osteopenia 06/2017   T score -2.0 FRAX 13% / 2.8%   PONV (postoperative nausea and vomiting)    Raynaud's disease    Rectocele    Rosacea    Scleroderma ([place])    Urinary frequency     Past Surgical History:  Procedure Laterality Date   APPENDECTOMY     colon cancer  1995   COLON RESECTION  1995   KNEE ARTHROSCOPY Left 01/14/2019   Procedure: ARTHROSCOPY KNEE, PARTIAL MENISCECTOMY, CHONDROPLASTY AND LOOSE BODY REMOVAL;  Surgeon: Dorna Leitz, MD;  Location: [facility];  Service: Orthopedics;  Laterality: Left;   MASS EXCISION Left 08/31/2012   Procedure: EXCISION MUCOID CYST DEBRIDEMENT DISTAL INTERPHALANGEAL LEFT MIDDLE FINGER;  Surgeon: Wynonia Sours, MD;  Location: [facility];  Service: Orthopedics;  Laterality: Left;   OOPHORECTOMY     LSO   ROTATOR CUFF REPAIR Right    TONSILLECTOMY     VAGINAL HYSTERECTOMY  1994     Social History:  The patient  reports that she quit smoking about 48 years ago. Her smoking use included cigarettes. She started smoking about 57 years ago. She has a 4.50 pack-year smoking history. She has never used smokeless tobacco. She reports current alcohol use of about 3.0 standard drinks per week. She reports that she does not use drugs.   Family History:  The patient's family history includes Diabetes in her brother and mother; Heart disease in her mother; Hypertension in her brother; Lupus in her father; Stroke in her mother.    ROS:  Please see the history of present illness. All other systems are reviewed and  Negative to the above problem except as noted.    PHYSICAL EXAM: VS:  BP 132/86   Pulse 78   Ht 5' 6.5" (1.689 m)   Wt 165 lb 3.2 oz (74.9 kg)   BMI 26.26  kg/m   GEN: Well nourished, well developed, in no acute distress  HEENT: normal  Neck: JVP is nromal  NO carotid bruits Cardiac: RRR; no murmurs, no LE  edema  Respiratory:  clear to auscultation bilaterally,  No ralres GI: soft, nontender, nondistended, + BS  No hepatomegaly  MS: no deformity Moving all extremities   Skin: warm and dry, no rash Neuro:  Strength and sensation are intact Psych: euthymic mood, full affect   EKG:  EKGshows NSR   78 bpm    Lipid Panel    Component Value Date/Time   CHOL 164 12/27/2018 0855   TRIG 75 12/27/2018 0855   HDL 61 12/27/2018 0855   CHOLHDL 2.7 12/27/2018 0855   LDLCALC 89 12/27/2018 0855      Wt Readings from Last 3 Encounters:  12/25/20 165 lb 3.2 oz (74.9 kg)  09/20/20 161 lb (73 kg)  09/05/20 160 lb (72.6 kg)      ASSESSMENT AND PLAN:  1.  CAD.  Found on cardiac on chest CT.  No symptoms of angina      2 dyslipidemia.  Will get lipomed panel at her convenience     3.Hx   Interstitial lung disease.  Followed by Chase Caller. Moving air well    4.  History of rheumatoid arthritis/scleroderma.  Followed by Dr.Deveshwar  F/U in 1 year     Current medicines are reviewed at length with the patient today.  The patient does not have concerns regarding medicines.  Signed, Dorris Carnes, MD  12/25/2020 2:43 PM    [address], [place], [place]  [address] Phone: [phone]; Fax: [phone]

## 2020-12-25 ENCOUNTER — Encounter: Payer: Self-pay | Admitting: Internal Medicine

## 2020-12-25 ENCOUNTER — Ambulatory Visit: Payer: Medicare PPO | Admitting: Internal Medicine

## 2020-12-25 ENCOUNTER — Other Ambulatory Visit: Payer: Self-pay

## 2020-12-25 VITALS — BP 132/86 | HR 78 | Ht 66.5 in | Wt 165.2 lb

## 2020-12-25 DIAGNOSIS — I2510 Atherosclerotic heart disease of native coronary artery without angina pectoris: Secondary | ICD-10-CM | POA: Diagnosis not present

## 2020-12-25 DIAGNOSIS — I10 Essential (primary) hypertension: Secondary | ICD-10-CM | POA: Diagnosis not present

## 2020-12-25 DIAGNOSIS — E782 Mixed hyperlipidemia: Secondary | ICD-10-CM

## 2020-12-25 NOTE — Patient Instructions (Addendum)
Medication Instructions:  NO CHANGES  *If you need a refill on your cardiac medications before your next appointment, please call your pharmacy*   Lab Work: [place]  If you have labs (blood work) drawn today and your tests are completely normal, you will receive your results only by: [facility] [place] (if you have MyChart) OR A paper copy in the mail If you have any lab test that is abnormal or we need to change your treatment, we will call you to review the results.   Testing/Procedures: NONE   Follow-Up: At [facility], you and your health needs are our priority.  As part of our continuing mission to provide you with exceptional heart care, we have created designated Provider Care Teams.  These Care Teams include your primary Cardiologist (physician) and Advanced Practice Providers (APPs -  Physician Assistants and Nurse Practitioners) who all work together to provide you with the care you need, when you need it.  We recommend signing up for the patient portal called "MyChart".  Sign up information is provided on this After Visit Summary.  MyChart is used to connect with patients for Virtual Visits (Telemedicine).  Patients are able to view lab/test results, encounter notes, upcoming appointments, etc.  Non-urgent messages can be sent to your provider as well.   To learn more about what you can do with MyChart, go to [email or web address].    Your next appointment:   12 month(s)  The format for your next appointment:   In Person  Provider:   Dorris Carnes, MD     Other Instructions NONE

## 2020-12-28 ENCOUNTER — Other Ambulatory Visit: Payer: Medicare PPO

## 2021-01-02 ENCOUNTER — Other Ambulatory Visit: Payer: Medicare PPO | Admitting: *Deleted

## 2021-01-02 ENCOUNTER — Other Ambulatory Visit: Payer: Self-pay

## 2021-01-02 DIAGNOSIS — E782 Mixed hyperlipidemia: Secondary | ICD-10-CM

## 2021-01-03 LAB — NMR, LIPOPROFILE
Cholesterol, Total: 174 mg/dL (ref 100–199)
HDL Particle Number: 48.6 umol/L (ref >=30.5)
HDL-C: 71 mg/dL (ref >39)
LDL Particle Number: 1116 nmol/L — ABNORMAL HIGH (ref <1000)
LDL Size: 21.2 nm (ref >20.5)
LDL-C (NIH Calc): 87 mg/dL (ref 0–99)
LP-IR Score: 29 (ref <=45)
Small LDL Particle Number: 477 nmol/L (ref <=527)
Triglycerides: 90 mg/dL (ref 0–149)

## 2021-01-07 NOTE — Progress Notes (Signed)
Office Visit Note  Patient: Lindsay Herman             Date of Birth: January 24, 1946           MRN: [id number]             PCP: Burnard Bunting, MD Referring: Burnard Bunting, MD Visit Date: 01/15/2021 Occupation: @GUAROCC @  Subjective:  Medication management   History of Present Illness: Lindsay Herman is a 75 y.o. female with a history of scleroderma, interstitial lung disease and osteoarthritis.  She saw Dr. Chase Caller on September 20, 2020.  At the time according to Dr. Golden Pop note her ILD was a stable and that she does not need any aggressive therapy.  Patient feels that she has some generalized deconditioning as she stopped going to the gym.  She has been trying to walk some on a regular basis.  She also had recent evaluation by Dr. Harrington Challenger for coronary calcification.  She denies any chest pain.  She denies any joint pain or joint swelling.  She has had been tolerating Imuran.  She has mild Raynauds symptoms.  Activities of Daily Living:  Patient reports morning stiffness for a few minutes.   Patient Denies nocturnal pain.  Difficulty dressing/grooming: Denies Difficulty climbing stairs: Denies Difficulty getting out of chair: Denies Difficulty using hands for taps, buttons, cutlery, and/or writing: Denies  Review of Systems  Constitutional:  Positive for fatigue. Negative for night sweats, weight gain and weight loss.  HENT:  Negative for mouth sores, trouble swallowing, trouble swallowing, mouth dryness and nose dryness.   Eyes:  Negative for pain, redness, itching, visual disturbance and dryness.  Respiratory:  Negative for cough, shortness of breath and difficulty breathing.   Cardiovascular:  Negative for chest pain, palpitations, hypertension, irregular heartbeat and swelling in legs/feet.  Gastrointestinal:  Negative for blood in stool, constipation and diarrhea.  Endocrine: Negative for increased urination.  Genitourinary:  Negative for difficulty urinating and vaginal  dryness.  Musculoskeletal:  Positive for morning stiffness. Negative for joint pain, joint pain, joint swelling, myalgias, muscle weakness, muscle tenderness and myalgias.  Skin:  Negative for color change, rash, hair loss, redness, skin tightness, ulcers and sensitivity to sunlight.  Allergic/Immunologic: Negative for susceptible to infections.  Neurological:  Positive for memory loss. Negative for dizziness, numbness, headaches, night sweats and weakness.  Hematological:  Negative for bruising/bleeding tendency and swollen glands.  Psychiatric/Behavioral:  Negative for depressed mood, confusion and sleep disturbance. The patient is not nervous/anxious.    PMFS History:  Patient Active Problem List   Diagnosis Date Noted   Acute medial meniscal tear, left, initial encounter 01/14/2019   Chondromalacia, left knee 01/14/2019   Loose body of left knee 01/14/2019   Scleroderma, limited ([facility]) 02/10/2016   History of environmental allergies 02/10/2016   Gastroesophageal reflux disease 02/10/2016   History of colon cancer 02/10/2016   Sicca syndrome 02/01/2016   History of repair of right rotator cuff 02/01/2016   Primary osteoarthritis of both knees 02/01/2016   Primary osteoarthritis of both hands 02/01/2016   Raynaud's disease without gangrene 02/01/2016   Cystocele    Atrophic vaginitis    Osteopenia    Menopausal symptoms     Past Medical History:  Diagnosis Date   Atherosclerosis    aorta and coronary artery   Atrophic vaginitis    Cancer ([facility]) 1995   Colon cancer tx surgery and chemo   Contact lens/glasses fitting    wears contacts or glasses  Cystocele    Nasal fracture    Osteopenia 06/2017   T score -2.0 FRAX 13% / 2.8%   PONV (postoperative nausea and vomiting)    Raynaud's disease    Rectocele    Rosacea    Scleroderma ([facility])    Urinary frequency     Family History  Problem Relation Age of Onset   Diabetes Mother    Heart disease Mother    Stroke Mother     Diabetes Brother    Hypertension Brother    Lupus Father    Past Surgical History:  Procedure Laterality Date   APPENDECTOMY     colon cancer  1995   COLON RESECTION  1995   KNEE ARTHROSCOPY Left 01/14/2019   Procedure: ARTHROSCOPY KNEE, PARTIAL MENISCECTOMY, CHONDROPLASTY AND LOOSE BODY REMOVAL;  Surgeon: Dorna Leitz, MD;  Location: [facility];  Service: Orthopedics;  Laterality: Left;   MASS EXCISION Left 08/31/2012   Procedure: EXCISION MUCOID CYST DEBRIDEMENT DISTAL INTERPHALANGEAL LEFT MIDDLE FINGER;  Surgeon: Wynonia Sours, MD;  Location: [facility];  Service: Orthopedics;  Laterality: Left;   OOPHORECTOMY     LSO   ROTATOR CUFF REPAIR Right    TONSILLECTOMY     VAGINAL HYSTERECTOMY  1994   Social History   Social History Narrative   Not on file   Immunization History  Administered Date(s) Administered   Influenza Split 12/12/2010   Influenza, High Dose Seasonal PF 10/24/2017, 11/08/2018   PFIZER(Purple Top)SARS-COV-2 Vaccination 03/18/2019, 04/12/2019, 09/29/2019, 03/27/2020   Pneumococcal Conjugate-13 07/24/2014   Pneumococcal Polysaccharide-23 01/31/2009   Zoster Recombinat (Shingrix) 11/27/2017, 02/12/2018     Objective: Vital Signs: BP 116/80 (BP Location: Left Arm, Patient Position: Sitting, Cuff Size: Normal)   Pulse 80   Ht 5' 6.5" (1.689 m)   Wt 163 lb (73.9 kg)   BMI 25.91 kg/m    Physical Exam Vitals and nursing note reviewed.  Constitutional:      Appearance: She is well-developed.  HENT:     Head: Normocephalic and atraumatic.  Eyes:     Conjunctiva/sclera: Conjunctivae normal.  Cardiovascular:     Rate and Rhythm: Normal rate and regular rhythm.     Heart sounds: Normal heart sounds.  Pulmonary:     Effort: Pulmonary effort is normal.     Breath sounds: Normal breath sounds.  Abdominal:     General: Bowel sounds are normal.     Palpations: Abdomen is soft.  Musculoskeletal:     Cervical back: Normal range of  motion.  Lymphadenopathy:     Cervical: No cervical adenopathy.  Skin:    General: Skin is warm and dry.     Capillary Refill: Capillary refill takes less than 2 seconds.     Comments: Mild sclerodactyly was noted.  No nailbed capillary changes were noted.  She had good capillary refill.  Neurological:     Mental Status: She is alert and oriented to person, place, and time.  Psychiatric:        Behavior: Behavior normal.     Musculoskeletal Exam: C-spine was in good range of motion.  Shoulder joints, elbow joints, wrist joints, MCPs PIPs and DIPs with good range of motion with no synovitis.  Hip joints, knee joints, ankles, MTPs and PIPs with good range of motion with no synovitis.  CDAI Exam: CDAI Score: -- Patient Global: --; Provider Global: -- Swollen: --; Tender: -- Joint Exam 01/15/2021   No joint exam has been documented for this  visit   There is currently no information documented on the homunculus. Go to the Rheumatology activity and complete the homunculus joint exam.  Investigation: No additional findings.  Imaging: No results found.  Recent Labs: Lab Results  Component Value Date   WBC 5.5 08/31/2020   HGB 13.7 08/31/2020   PLT 309 08/31/2020   NA 141 08/31/2020   K 5.0 08/31/2020   CL 105 08/31/2020   CO2 30 08/31/2020   GLUCOSE 95 08/31/2020   BUN 11 08/31/2020   CREATININE 0.79 08/31/2020   BILITOT 0.4 08/31/2020   ALKPHOS 64 10/19/2018   AST 15 08/31/2020   ALT 9 08/31/2020   PROT 6.7 08/31/2020   ALBUMIN 4.3 10/19/2018   CALCIUM 10.0 08/31/2020   GFRAA 97 06/14/2020   QFTBGOLDPLUS NEGATIVE 10/06/2017    Speciality Comments: Osteoporosis managed by Dr. Phineas Real.  Procedures:  No procedures performed Allergies: Codeine, Morphine, and Morphine and related   Assessment / Plan:     Visit Diagnoses: Scleroderma, limited ([place]) - Hx sclerodactyly, Raynauds, arthralgias, ILD, +ANA, +Ro, +La, -RF.  She has sclerodactyly.  She had good capillary  refill.  Raynauds is not active.  She denies sicca symptoms.  ILD appears to be stable.  Chronic inflammatory arthritis - RF-, CCP-, 14-3-3 eta negative, R wrist synovitis, erosive changes, intercarpal and radiocarpal joint space narrowing.  She had no synovitis on examination.  Interstitial lung disease ([place]) - followed by Dr. Chase Caller and Dorris Carnes.  ILD is a stable per Dr. Golden Pop notes.  She is seeing Dr. Harrington Challenger for coronary calcifications.  High risk medication use - Imuran 50 mg po daily. Off PLQ due to Macular Degenration Concern.  Labs obtained in July were reviewed.  She will get labs today and then every 3 months to monitor for drug toxicity.  She is advised to hold Humira in case she develops an infection and restart once the infection resolves.  Information about immunization was placed in the AVS.  TPMT intermediate metabolizer (HCC)  Raynaud's disease without gangrene-currently the symptoms are not very active.  Primary osteoarthritis of both hands-joint protection muscle strengthening was discussed.  History of repair of right rotator cuff  Primary osteoarthritis of both knees-she had good range of motion without any warmth swelling or effusion.  S/P arthroscopy of right knee  History of gastroesophageal reflux (GERD)-currently not symptomatic.  History of colon cancer  Osteopenia of multiple sites - followed by her GYN. She is on Fosamax.  Orders: Orders Placed This Encounter  Procedures   CBC with Differential/Platelet   COMPLETE METABOLIC PANEL WITH GFR   Urinalysis, Routine w reflex microscopic    No orders of the defined types were placed in this encounter.    Follow-Up Instructions: Return in about 5 months (around 06/15/2021) for Scleroderma,ILD.   Bo Merino, MD  Note - This record has been created using Editor, commissioning.  Chart creation errors have been sought, but may not always  have been located. Such creation errors do not reflect on   the standard of medical care.

## 2021-01-11 ENCOUNTER — Telehealth: Payer: Self-pay

## 2021-01-11 DIAGNOSIS — E782 Mixed hyperlipidemia: Secondary | ICD-10-CM

## 2021-01-11 DIAGNOSIS — I2510 Atherosclerotic heart disease of native coronary artery without angina pectoris: Secondary | ICD-10-CM

## 2021-01-11 DIAGNOSIS — Z79899 Other long term (current) drug therapy: Secondary | ICD-10-CM

## 2021-01-11 MED ORDER — EZETIMIBE 10 MG PO TABS: 10.0000 mg | ORAL_TABLET | Freq: Every day | ORAL | 3 refills | Status: DC

## 2021-01-11 NOTE — Telephone Encounter (Signed)
Lm for the pt to call back but also sent her a My Chart.   Med and labs ordered, for 03/14/21.

## 2021-01-11 NOTE — Telephone Encounter (Signed)
-----   Message from Fay Records, MD sent at 01/08/2021  1:48 PM EST ----- Lipids   LDL is higher than it should be knowing that she has CAD   It is 87    Would recomm that it is 70 or less Woukd ADD Bary Richard ti regimen  10 mg daily  Follow up lpids in 8 wks with AST

## 2021-01-15 ENCOUNTER — Ambulatory Visit: Payer: Medicare PPO | Admitting: Rheumatology

## 2021-01-15 ENCOUNTER — Other Ambulatory Visit: Payer: Self-pay

## 2021-01-15 ENCOUNTER — Encounter: Payer: Self-pay | Admitting: Rheumatology

## 2021-01-15 VITALS — BP 116/80 | HR 80 | Ht 66.5 in | Wt 163.0 lb

## 2021-01-15 DIAGNOSIS — Z8719 Personal history of other diseases of the digestive system: Secondary | ICD-10-CM

## 2021-01-15 DIAGNOSIS — M8589 Other specified disorders of bone density and structure, multiple sites: Secondary | ICD-10-CM

## 2021-01-15 DIAGNOSIS — M170 Bilateral primary osteoarthritis of knee: Secondary | ICD-10-CM | POA: Diagnosis not present

## 2021-01-15 DIAGNOSIS — M1990 Unspecified osteoarthritis, unspecified site: Secondary | ICD-10-CM | POA: Diagnosis not present

## 2021-01-15 DIAGNOSIS — M1380 Other specified arthritis, unspecified site: Secondary | ICD-10-CM

## 2021-01-15 DIAGNOSIS — Z79899 Other long term (current) drug therapy: Secondary | ICD-10-CM | POA: Diagnosis not present

## 2021-01-15 DIAGNOSIS — Z85038 Personal history of other malignant neoplasm of large intestine: Secondary | ICD-10-CM

## 2021-01-15 DIAGNOSIS — I7300 Raynaud's syndrome without gangrene: Secondary | ICD-10-CM

## 2021-01-15 DIAGNOSIS — M349 Systemic sclerosis, unspecified: Secondary | ICD-10-CM | POA: Diagnosis not present

## 2021-01-15 DIAGNOSIS — M19041 Primary osteoarthritis, right hand: Secondary | ICD-10-CM | POA: Diagnosis not present

## 2021-01-15 DIAGNOSIS — Z9889 Other specified postprocedural states: Secondary | ICD-10-CM

## 2021-01-15 DIAGNOSIS — M19042 Primary osteoarthritis, left hand: Secondary | ICD-10-CM

## 2021-01-15 DIAGNOSIS — E8889 Other specified metabolic disorders: Secondary | ICD-10-CM

## 2021-01-15 DIAGNOSIS — J849 Interstitial pulmonary disease, unspecified: Secondary | ICD-10-CM | POA: Diagnosis not present

## 2021-01-15 NOTE — Patient Instructions (Signed)
Standing Labs We placed an order today for your standing lab work.   Please have your standing labs drawn in march and every 3 months  If possible, please have your labs drawn 2 weeks prior to your appointment so that the provider can discuss your results at your appointment.  Please note that you may see your imaging and lab results in [facility] before we have reviewed them. We may be awaiting multiple results to interpret others before contacting you. Please allow our office up to 72 hours to thoroughly review all of the results before contacting the office for clarification of your results.  We have open lab daily: Monday through Thursday from 1:30-4:30 PM and Friday from 1:30-4:00 PM at the office of Dr. Bo Merino, [facility].   Please be advised, all patients with office appointments requiring lab work will take precedent over walk-in lab work.  If possible, please come for your lab work on Monday and Friday afternoons, as you may experience shorter wait times. The office is located at [address], [place], [place], [place] [address] No appointment is necessary.   Labs are drawn by [facility]. Please bring your co-pay at the time of your lab draw.  You may receive a bill from [facility] for your lab work.  If you wish to have your labs drawn at another location, please call the office 24 hours in advance to send orders.  If you have any questions regarding directions or hours of operation,  please call [phone].   As a reminder, please drink plenty of water prior to coming for your lab work. Thanks!   Vaccines You are taking a medication(s) that can suppress your immune system.  The following immunizations are recommended: Flu annually Covid-19  Td/Tdap (tetanus, diphtheria, pertussis) every 10 years Pneumonia (Prevnar 15 then Pneumovax 23 at least 1 year apart.  Alternatively, can take Prevnar 20 without needing additional dose) Shingrix: 2 doses from 4 weeks  to 6 months apart  Please check with your PCP to make sure you are up to date.   If you have signs or symptoms of an infection or start antibiotics: First, call your PCP for workup of your infection. Hold your medication through the infection, until you complete your antibiotics, and until symptoms resolve if you take the following: Injectable medication (Actemra, Benlysta, Cimzia, Cosentyx, Enbrel, Humira, Kevzara, Orencia, Remicade, Simponi, Stelara, Taltz, Tremfya) Methotrexate Leflunomide (Arava) Mycophenolate (Cellcept) Morrie Sheldon, Olumiant, or Rinvoq

## 2021-01-16 LAB — COMPLETE METABOLIC PANEL WITH GFR
AG Ratio: 1.7 (calc) (ref 1.0–2.5)
ALT: 15 U/L (ref 6–29)
AST: 18 U/L (ref 10–35)
Albumin: 4 g/dL (ref 3.6–5.1)
Alkaline phosphatase (APISO): 56 U/L (ref 37–153)
BUN: 15 mg/dL (ref 7–25)
CO2: 29 mmol/L (ref 20–32)
Calcium: 9.3 mg/dL (ref 8.6–10.4)
Chloride: 107 mmol/L (ref 98–110)
Creat: 0.91 mg/dL (ref 0.60–1.00)
Globulin: 2.3 g/dL (calc) (ref 1.9–3.7)
Glucose, Bld: 86 mg/dL (ref 65–99)
Potassium: 4.6 mmol/L (ref 3.5–5.3)
Sodium: 142 mmol/L (ref 135–146)
Total Bilirubin: 0.3 mg/dL (ref 0.2–1.2)
Total Protein: 6.3 g/dL (ref 6.1–8.1)
eGFR: 65 mL/min/{1.73_m2} (ref >=60)

## 2021-01-16 LAB — CBC WITH DIFFERENTIAL/PLATELET
Absolute Monocytes: 891 cells/uL (ref 200–950)
Basophils Absolute: 32 cells/uL (ref 0–200)
Basophils Relative: 0.4 %
Eosinophils Absolute: 41 cells/uL (ref 15–500)
Eosinophils Relative: 0.5 %
HCT: 42.2 % (ref 35.0–45.0)
Hemoglobin: 14 g/dL (ref 11.7–15.5)
Lymphs Abs: 810 cells/uL — ABNORMAL LOW (ref 850–3900)
MCH: 32.2 pg (ref 27.0–33.0)
MCHC: 33.2 g/dL (ref 32.0–36.0)
MCV: 97 fL (ref 80.0–100.0)
MPV: 10.8 fL (ref 7.5–12.5)
Monocytes Relative: 11 %
Neutro Abs: 6326 cells/uL (ref 1500–7800)
Neutrophils Relative %: 78.1 %
Platelets: 294 10*3/uL (ref 140–400)
RBC: 4.35 10*6/uL (ref 3.80–5.10)
RDW: 12.5 % (ref 11.0–15.0)
Total Lymphocyte: 10 %
WBC: 8.1 10*3/uL (ref 3.8–10.8)

## 2021-01-16 LAB — URINALYSIS, ROUTINE W REFLEX MICROSCOPIC
Bacteria, UA: NONE SEEN /HPF
Bilirubin Urine: NEGATIVE
Glucose, UA: NEGATIVE
Hgb urine dipstick: NEGATIVE
Hyaline Cast: NONE SEEN /LPF
Leukocytes,Ua: NEGATIVE
Nitrite: NEGATIVE
Specific Gravity, Urine: 1.035 (ref 1.001–1.035)
pH: 5.5 (ref 5.0–8.0)

## 2021-01-16 LAB — MICROSCOPIC MESSAGE

## 2021-01-16 NOTE — Progress Notes (Signed)
Lymphocyte count is low and stable.  CMP is normal.  Urine showed trace ketones, trace protein and white cells.  Please advise increase water intake.

## 2021-01-31 DIAGNOSIS — M25531 Pain in right wrist: Secondary | ICD-10-CM | POA: Diagnosis not present

## 2021-01-31 DIAGNOSIS — M79641 Pain in right hand: Secondary | ICD-10-CM | POA: Diagnosis not present

## 2021-02-12 DIAGNOSIS — M25531 Pain in right wrist: Secondary | ICD-10-CM | POA: Diagnosis not present

## 2021-02-12 DIAGNOSIS — M19031 Primary osteoarthritis, right wrist: Secondary | ICD-10-CM | POA: Diagnosis not present

## 2021-02-28 DIAGNOSIS — L570 Actinic keratosis: Secondary | ICD-10-CM | POA: Diagnosis not present

## 2021-02-28 DIAGNOSIS — Z85828 Personal history of other malignant neoplasm of skin: Secondary | ICD-10-CM | POA: Diagnosis not present

## 2021-03-02 ENCOUNTER — Other Ambulatory Visit: Payer: Self-pay | Admitting: Internal Medicine

## 2021-03-21 DIAGNOSIS — M79675 Pain in left toe(s): Secondary | ICD-10-CM | POA: Diagnosis not present

## 2021-03-27 ENCOUNTER — Other Ambulatory Visit: Payer: Self-pay

## 2021-03-27 ENCOUNTER — Other Ambulatory Visit: Payer: Medicare PPO | Admitting: *Deleted

## 2021-03-27 DIAGNOSIS — I2510 Atherosclerotic heart disease of native coronary artery without angina pectoris: Secondary | ICD-10-CM | POA: Diagnosis not present

## 2021-03-27 DIAGNOSIS — Z79899 Other long term (current) drug therapy: Secondary | ICD-10-CM | POA: Diagnosis not present

## 2021-03-27 DIAGNOSIS — E782 Mixed hyperlipidemia: Secondary | ICD-10-CM

## 2021-03-27 LAB — LIPID PANEL
Chol/HDL Ratio: 2.3 ratio (ref 0.0–4.4)
Cholesterol, Total: 150 mg/dL (ref 100–199)
HDL: 65 mg/dL (ref >39)
LDL Chol Calc (NIH): 71 mg/dL (ref 0–99)
Triglycerides: 72 mg/dL (ref 0–149)
VLDL Cholesterol Cal: 14 mg/dL (ref 5–40)

## 2021-03-27 LAB — AST: AST: 16 IU/L (ref 0–40)

## 2021-03-29 ENCOUNTER — Other Ambulatory Visit: Payer: Self-pay | Admitting: *Deleted

## 2021-03-29 MED ORDER — AZATHIOPRINE 50 MG PO TABS: ORAL_TABLET | ORAL | 0 refills | Status: DC

## 2021-03-29 NOTE — Telephone Encounter (Signed)
Refill request received via fax  Next Visit: 06/19/2021  Last Visit: 01/15/2021  Last Fill: 10/16/2020  DX: Scleroderma, limited  Current Dose per office note 01/15/2021: Imuran 50 mg po daily.  Labs: 01/15/2021 Lymphocyte count is low and stable.  CMP is normal.   Okay to refill Imuran?

## 2021-04-03 DIAGNOSIS — D122 Benign neoplasm of ascending colon: Secondary | ICD-10-CM | POA: Diagnosis not present

## 2021-04-03 DIAGNOSIS — Z85038 Personal history of other malignant neoplasm of large intestine: Secondary | ICD-10-CM | POA: Diagnosis not present

## 2021-04-03 DIAGNOSIS — Z980 Intestinal bypass and anastomosis status: Secondary | ICD-10-CM | POA: Diagnosis not present

## 2021-04-05 DIAGNOSIS — D122 Benign neoplasm of ascending colon: Secondary | ICD-10-CM | POA: Diagnosis not present

## 2021-04-22 DIAGNOSIS — I10 Essential (primary) hypertension: Secondary | ICD-10-CM | POA: Diagnosis not present

## 2021-04-22 DIAGNOSIS — I700 Atherosclerosis of aorta: Secondary | ICD-10-CM | POA: Diagnosis not present

## 2021-04-22 DIAGNOSIS — M1990 Unspecified osteoarthritis, unspecified site: Secondary | ICD-10-CM | POA: Diagnosis not present

## 2021-04-22 DIAGNOSIS — J849 Interstitial pulmonary disease, unspecified: Secondary | ICD-10-CM | POA: Diagnosis not present

## 2021-06-05 NOTE — Progress Notes (Signed)
? ?Office Visit Note ? ?Patient: Lindsay Herman             ?Date of Birth: [birth year]           ?MRN: [id number]             ?PCP: Burnard Bunting, MD ?Referring: Burnard Bunting, MD ?Visit Date: 06/19/2021 ?Occupation: '@GUAROCC'$ @ ? ?Subjective:  ?Medication management ? ?History of Present Illness: Lindsay Herman is a 76 y.o. female with history of scleroderma, osteoarthritis and ILD.  She states she continues to have some discomfort in her hands which she describes over the DIP joints.  She does not have much pain.  She continues to have some shortness of breath.  She has not seen Dr. Chase Caller since August 2022.  She states that getting PFTs is difficult for her.  She had recent evaluation by Dr. Harrington Challenger.  She states she was placed on Fosamax by her GYN after the bone density.  She is not having any reflux symptoms or any side effects from Fosamax.  She is concerned about some memory loss.  She has difficulty with word finding at times.  She is planning to see a neurologist. ? ?Activities of Daily Living:  ?Patient reports morning stiffness for 0 minutes.   ?Patient Denies nocturnal pain.  ?Difficulty dressing/grooming: Denies ?Difficulty climbing stairs: Denies ?Difficulty getting out of chair: Denies ?Difficulty using hands for taps, buttons, cutlery, and/or writing: Denies ? ?Review of Systems  ?Constitutional:  Negative for fatigue.  ?HENT:  Negative for mouth sores, mouth dryness and nose dryness.   ?Eyes:  Negative for pain, itching and dryness.  ?Respiratory:  Positive for shortness of breath. Negative for difficulty breathing.   ?Cardiovascular:  Negative for chest pain and palpitations.  ?Gastrointestinal:  Negative for blood in stool, constipation and diarrhea.  ?Endocrine: Negative for increased urination.  ?Genitourinary:  Negative for difficulty urinating.  ?Musculoskeletal:  Positive for joint swelling. Negative for joint pain, joint pain, myalgias, morning stiffness, muscle tenderness and myalgias.   ?Skin:  Negative for color change, rash, redness and sensitivity to sunlight.  ?Allergic/Immunologic: Negative for susceptible to infections.  ?Neurological:  Positive for memory loss. Negative for dizziness, numbness, headaches and weakness.  ?Hematological:  Negative for bruising/bleeding tendency.  ?Psychiatric/Behavioral:  Negative for confusion.   ? ?PMFS History:  ?Patient Active Problem List  ? Diagnosis Date Noted  ? Acute medial meniscal tear, left, initial encounter 01/14/2019  ? Chondromalacia, left knee 01/14/2019  ? Loose body of left knee 01/14/2019  ? Scleroderma, limited ([place]) 02/10/2016  ? History of environmental allergies 02/10/2016  ? Gastroesophageal reflux disease 02/10/2016  ? History of colon cancer 02/10/2016  ? Sicca syndrome 02/01/2016  ? History of repair of right rotator cuff 02/01/2016  ? Primary osteoarthritis of both knees 02/01/2016  ? Primary osteoarthritis of both hands 02/01/2016  ? Raynaud's disease without gangrene 02/01/2016  ? Cystocele   ? Atrophic vaginitis   ? Osteopenia   ? Menopausal symptoms   ?  ?Past Medical History:  ?Diagnosis Date  ? Atherosclerosis   ? aorta and coronary artery  ? Atrophic vaginitis   ? Cancer [facility]) 1995  ? Colon cancer tx surgery and chemo  ? Contact lens/glasses fitting   ? wears contacts or glasses  ? Cystocele   ? Nasal fracture   ? Osteopenia 06/2017  ? T score -2.0 FRAX 13% / 2.8%  ? PONV (postoperative nausea and vomiting)   ? Raynaud's disease   ?  Rectocele   ? Rosacea   ? Scleroderma ([place])   ? Urinary frequency   ?  ?Family History  ?Problem Relation Age of Onset  ? Diabetes Mother   ? Heart disease Mother   ? Stroke Mother   ? Diabetes Brother   ? Hypertension Brother   ? Lupus Father   ? ?Past Surgical History:  ?Procedure Laterality Date  ? APPENDECTOMY    ? colon cancer  1995  ? COLON RESECTION  1995  ? KNEE ARTHROSCOPY Left 01/14/2019  ? Procedure: ARTHROSCOPY KNEE, PARTIAL MENISCECTOMY, CHONDROPLASTY AND LOOSE BODY REMOVAL;  Surgeon:  Dorna Leitz, MD;  Location: [place];  Service: Orthopedics;  Laterality: Left;  ? MASS EXCISION Left 08/31/2012  ? Procedure: EXCISION MUCOID CYST DEBRIDEMENT DISTAL INTERPHALANGEAL LEFT MIDDLE FINGER;  Surgeon: Wynonia Sours, MD;  Location: [place];  Service: Orthopedics;  Laterality: Left;  ? OOPHORECTOMY    ? LSO  ? ROTATOR CUFF REPAIR Right   ? TONSILLECTOMY    ? VAGINAL HYSTERECTOMY  1994  ? ?Social History  ? ?Social History Narrative  ? Not on file  ? ?Immunization History  ?Administered Date(s) Administered  ? Influenza Split 12/12/2010  ? Influenza, High Dose Seasonal PF 10/24/2017, 11/08/2018  ? PFIZER(Purple Top)SARS-COV-2 Vaccination 03/18/2019, 04/12/2019, 09/29/2019, 03/27/2020  ? Pneumococcal Conjugate-13 07/24/2014  ? Pneumococcal Polysaccharide-23 01/31/2009  ? Zoster Recombinat (Shingrix) 11/27/2017, 02/12/2018  ?  ? ?Objective: ?Vital Signs: BP 136/78 (BP Location: Left Arm, Patient Position: Sitting, Cuff Size: Normal)   Pulse 66   Ht 5' 6.5" (1.689 m)   Wt 162 lb 3.2 oz (73.6 kg)   BMI 25.79 kg/m?   ? ?Physical Exam ?Vitals and nursing note reviewed.  ?Constitutional:   ?   Appearance: She is well-developed.  ?HENT:  ?   Head: Normocephalic and atraumatic.  ?Eyes:  ?   Conjunctiva/sclera: Conjunctivae normal.  ?Cardiovascular:  ?   Rate and Rhythm: Normal rate and regular rhythm.  ?   Heart sounds: Normal heart sounds.  ?Pulmonary:  ?   Effort: Pulmonary effort is normal.  ?   Breath sounds: Normal breath sounds.  ?   Comments: Crackles in bilateral lung bases ?Abdominal:  ?   General: Bowel sounds are normal.  ?   Palpations: Abdomen is soft.  ?Musculoskeletal:  ?   Cervical back: Normal range of motion.  ?Lymphadenopathy:  ?   Cervical: No cervical adenopathy.  ?Skin: ?   General: Skin is warm and dry.  ?   Capillary Refill: Capillary refill takes less than 2 seconds.  ?   Comments: Mild sclerodactyly noted  ?Neurological:  ?   Mental Status: She is  alert and oriented to person, place, and time.  ?Psychiatric:     ?   Behavior: Behavior normal.  ?  ? ?Musculoskeletal Exam: C-spine was in good range of motion.  Shoulder joints, elbow joints, wrist joints in good range of motion.  She had bilateral PIP and DIP thickening.  Hip joints, knee joints, ankles and good range of motion.  She had no tenderness over MTPs or PIPs. ? ?CDAI Exam: ?CDAI Score: -- ?Patient Global: --; Provider Global: -- ?Swollen: --; Tender: -- ?Joint Exam 06/19/2021  ? ?No joint exam has been documented for this visit  ? ?There is currently no information documented on the homunculus. Go to the Rheumatology activity and complete the homunculus joint exam. ? ?Investigation: ?No additional findings. ? ?Imaging: ?No results found. ? ?  Recent Labs: ?Lab Results  ?Component Value Date  ? WBC 6.2 06/14/2021  ? HGB 13.6 06/14/2021  ? PLT 311 06/14/2021  ? NA 140 06/14/2021  ? K 4.4 06/14/2021  ? CL 105 06/14/2021  ? CO2 27 06/14/2021  ? GLUCOSE 98 06/14/2021  ? BUN 14 06/14/2021  ? CREATININE 0.82 06/14/2021  ? BILITOT 0.3 06/14/2021  ? ALKPHOS 64 10/19/2018  ? AST 19 06/14/2021  ? ALT 14 06/14/2021  ? PROT 6.3 06/14/2021  ? ALBUMIN 4.3 10/19/2018  ? CALCIUM 9.3 06/14/2021  ? GFRAA 97 06/14/2020  ? QFTBGOLDPLUS NEGATIVE 10/06/2017  ? ? ?Speciality Comments: Osteoporosis managed by Dr. Phineas Real. ? ?09/20/20 You have interstitial lung disease secondary to scleroderma. ?-  mild and is also stable between March 2018 ->  October 2019 -> Sept 2020 ->July 2021 -> august 2022. Though In august 2022 one part of your breathing test ? Technical issues might suggest decline ?- can unpredictably become worse ?  ?Unclear reason for hoarse voice ?  ?  ?Plan ?-At this time I recommend you continue Imuran that was prescribed by Dr. Keturah Barre for your joint pain ?- I do not recommend adding anti-fibrotics at this time given stability of disease and GI side effects with this drug in context of prior colectomy ?- If you have  intolerance to Imuran or  progression of fibrosis then we can add nintedanib or discus injection actemra ?- refer ENT ?-  We will continue to monitor your situation with serial clinical visits lung function

## 2021-06-14 ENCOUNTER — Other Ambulatory Visit: Payer: Self-pay | Admitting: *Deleted

## 2021-06-14 DIAGNOSIS — E8889 Other specified metabolic disorders: Secondary | ICD-10-CM | POA: Diagnosis not present

## 2021-06-14 DIAGNOSIS — Z79899 Other long term (current) drug therapy: Secondary | ICD-10-CM | POA: Diagnosis not present

## 2021-06-14 DIAGNOSIS — M349 Systemic sclerosis, unspecified: Secondary | ICD-10-CM

## 2021-06-14 DIAGNOSIS — M1990 Unspecified osteoarthritis, unspecified site: Secondary | ICD-10-CM | POA: Diagnosis not present

## 2021-06-14 DIAGNOSIS — I7300 Raynaud's syndrome without gangrene: Secondary | ICD-10-CM

## 2021-06-14 DIAGNOSIS — M85851 Other specified disorders of bone density and structure, right thigh: Secondary | ICD-10-CM | POA: Diagnosis not present

## 2021-06-14 DIAGNOSIS — M1380 Other specified arthritis, unspecified site: Secondary | ICD-10-CM

## 2021-06-14 DIAGNOSIS — J849 Interstitial pulmonary disease, unspecified: Secondary | ICD-10-CM

## 2021-06-14 DIAGNOSIS — M85852 Other specified disorders of bone density and structure, left thigh: Secondary | ICD-10-CM | POA: Diagnosis not present

## 2021-06-15 LAB — CBC WITH DIFFERENTIAL/PLATELET
Absolute Monocytes: 781 cells/uL (ref 200–950)
Basophils Absolute: 31 cells/uL (ref 0–200)
Basophils Relative: 0.5 %
Eosinophils Absolute: 50 cells/uL (ref 15–500)
Eosinophils Relative: 0.8 %
HCT: 41.7 % (ref 35.0–45.0)
Hemoglobin: 13.6 g/dL (ref 11.7–15.5)
Lymphs Abs: 880 cells/uL (ref 850–3900)
MCH: 31.3 pg (ref 27.0–33.0)
MCHC: 32.6 g/dL (ref 32.0–36.0)
MCV: 96.1 fL (ref 80.0–100.0)
MPV: 11.2 fL (ref 7.5–12.5)
Monocytes Relative: 12.6 %
Neutro Abs: 4458 cells/uL (ref 1500–7800)
Neutrophils Relative %: 71.9 %
Platelets: 311 10*3/uL (ref 140–400)
RBC: 4.34 10*6/uL (ref 3.80–5.10)
RDW: 12.6 % (ref 11.0–15.0)
Total Lymphocyte: 14.2 %
WBC: 6.2 10*3/uL (ref 3.8–10.8)

## 2021-06-15 LAB — COMPLETE METABOLIC PANEL WITH GFR
AG Ratio: 1.9 (calc) (ref 1.0–2.5)
ALT: 14 U/L (ref 6–29)
AST: 19 U/L (ref 10–35)
Albumin: 4.1 g/dL (ref 3.6–5.1)
Alkaline phosphatase (APISO): 43 U/L (ref 37–153)
BUN: 14 mg/dL (ref 7–25)
CO2: 27 mmol/L (ref 20–32)
Calcium: 9.3 mg/dL (ref 8.6–10.4)
Chloride: 105 mmol/L (ref 98–110)
Creat: 0.82 mg/dL (ref 0.60–1.00)
Globulin: 2.2 g/dL (calc) (ref 1.9–3.7)
Glucose, Bld: 98 mg/dL (ref 65–99)
Potassium: 4.4 mmol/L (ref 3.5–5.3)
Sodium: 140 mmol/L (ref 135–146)
Total Bilirubin: 0.3 mg/dL (ref 0.2–1.2)
Total Protein: 6.3 g/dL (ref 6.1–8.1)
eGFR: 74 mL/min/{1.73_m2} (ref >=60)

## 2021-06-19 ENCOUNTER — Telehealth: Payer: Self-pay | Admitting: Rheumatology

## 2021-06-19 ENCOUNTER — Encounter: Payer: Self-pay | Admitting: Rheumatology

## 2021-06-19 ENCOUNTER — Ambulatory Visit: Payer: Medicare PPO | Admitting: Rheumatology

## 2021-06-19 VITALS — BP 136/78 | HR 66 | Ht 66.5 in | Wt 162.2 lb

## 2021-06-19 DIAGNOSIS — M19042 Primary osteoarthritis, left hand: Secondary | ICD-10-CM

## 2021-06-19 DIAGNOSIS — Z9889 Other specified postprocedural states: Secondary | ICD-10-CM

## 2021-06-19 DIAGNOSIS — J849 Interstitial pulmonary disease, unspecified: Secondary | ICD-10-CM | POA: Diagnosis not present

## 2021-06-19 DIAGNOSIS — M349 Systemic sclerosis, unspecified: Secondary | ICD-10-CM

## 2021-06-19 DIAGNOSIS — M19041 Primary osteoarthritis, right hand: Secondary | ICD-10-CM | POA: Diagnosis not present

## 2021-06-19 DIAGNOSIS — M1990 Unspecified osteoarthritis, unspecified site: Secondary | ICD-10-CM | POA: Diagnosis not present

## 2021-06-19 DIAGNOSIS — I7300 Raynaud's syndrome without gangrene: Secondary | ICD-10-CM

## 2021-06-19 DIAGNOSIS — E8889 Other specified metabolic disorders: Secondary | ICD-10-CM

## 2021-06-19 DIAGNOSIS — M8589 Other specified disorders of bone density and structure, multiple sites: Secondary | ICD-10-CM

## 2021-06-19 DIAGNOSIS — Z79899 Other long term (current) drug therapy: Secondary | ICD-10-CM

## 2021-06-19 DIAGNOSIS — M170 Bilateral primary osteoarthritis of knee: Secondary | ICD-10-CM

## 2021-06-19 DIAGNOSIS — Z85038 Personal history of other malignant neoplasm of large intestine: Secondary | ICD-10-CM

## 2021-06-19 DIAGNOSIS — R413 Other amnesia: Secondary | ICD-10-CM

## 2021-06-19 DIAGNOSIS — Z8719 Personal history of other diseases of the digestive system: Secondary | ICD-10-CM

## 2021-06-19 NOTE — Telephone Encounter (Signed)
Referral placed and left message to advise patient.  ?

## 2021-06-19 NOTE — Patient Instructions (Signed)
Standing Labs We placed an order today for your standing lab work.   Please have your standing labs drawn in August  and every 3 months  If possible, please have your labs drawn 2 weeks prior to your appointment so that the provider can discuss your results at your appointment.  Please note that you may see your imaging and lab results in MyChart before we have reviewed them. We may be awaiting multiple results to interpret others before contacting you. Please allow our office up to 72 hours to thoroughly review all of the results before contacting the office for clarification of your results.  We have open lab daily: Monday through Thursday from 1:30-4:30 PM and Friday from 1:30-4:00 PM at the office of Dr. Jill Ruppe, [facility].   Please be advised, all patients with office appointments requiring lab work will take precedent over walk-in lab work.  If possible, please come for your lab work on Monday and Friday afternoons, as you may experience shorter wait times. The office is located at [address], [address], [place], [place] [address] No appointment is necessary.   Labs are drawn by [facility]. Please bring your co-pay at the time of your lab draw.  You may receive a bill from [facility] for your lab work.  Please note if you are on Hydroxychloroquine and and an order has been placed for a Hydroxychloroquine level, you will need to have it drawn 4 hours or more after your last dose.  If you wish to have your labs drawn at another location, please call the office 24 hours in advance to send orders.  If you have any questions regarding directions or hours of operation,  please call [phone].   As a reminder, please drink plenty of water prior to coming for your lab work. Thanks!   Vaccines You are taking a medication(s) that can suppress your immune system.  The following immunizations are recommended: Flu annually Covid-19  Td/Tdap (tetanus, diphtheria,  pertussis) every 10 years Pneumonia (Prevnar 15 then Pneumovax 23 at least 1 year apart.  Alternatively, can take Prevnar 20 without needing additional dose) Shingrix: 2 doses from 4 weeks to 6 months apart  Please check with your PCP to make sure you are up to date.   If you have signs or symptoms of an infection or start antibiotics: First, call your PCP for workup of your infection. Hold your medication through the infection, until you complete your antibiotics, and until symptoms resolve if you take the following: Injectable medication (Actemra, Benlysta, Cimzia, Cosentyx, Enbrel, Humira, Kevzara, Orencia, Remicade, Simponi, Stelara, Taltz, Tremfya) Methotrexate Leflunomide (Arava) Mycophenolate (Cellcept) Xeljanz, Olumiant, or Rinvoq  

## 2021-06-19 NOTE — Telephone Encounter (Signed)
Patient called the office stating that at her appointment this morning Dr. Estanislado Pandy mentioned she thought it was a good idea for the patient to see neurology. Patient requests a referral be sent to [facility], Dr Delice Lesch. ?

## 2021-06-19 NOTE — Telephone Encounter (Signed)
Yes, for memory loss.

## 2021-06-20 ENCOUNTER — Encounter: Payer: Self-pay | Admitting: Physician Assistant

## 2021-06-26 ENCOUNTER — Ambulatory Visit: Payer: Medicare PPO | Admitting: Physician Assistant

## 2021-06-27 ENCOUNTER — Other Ambulatory Visit: Payer: Self-pay | Admitting: Physician Assistant

## 2021-06-27 NOTE — Telephone Encounter (Signed)
Next Visit: 12/18/2021  Last Visit: 06/19/2021  Last Fill: 03/29/2021  DX: Scleroderma, limited   Current Dose per office note 06/19/2021: Imuran 50 mg po daily  Labs: 06/14/2021 CBC and CMP WNL  Okay to refill Imuran?

## 2021-06-28 ENCOUNTER — Encounter: Payer: Self-pay | Admitting: Physician Assistant

## 2021-06-28 ENCOUNTER — Other Ambulatory Visit (INDEPENDENT_AMBULATORY_CARE_PROVIDER_SITE_OTHER): Payer: Medicare PPO

## 2021-06-28 ENCOUNTER — Ambulatory Visit: Payer: Medicare PPO | Admitting: Physician Assistant

## 2021-06-28 VITALS — BP 129/85 | HR 78 | Resp 20 | Ht 66.5 in | Wt 163.0 lb

## 2021-06-28 DIAGNOSIS — R413 Other amnesia: Secondary | ICD-10-CM

## 2021-06-28 DIAGNOSIS — G3184 Mild cognitive impairment, so stated: Secondary | ICD-10-CM | POA: Diagnosis not present

## 2021-06-28 LAB — VITAMIN B12: Vitamin B-12: 595 pg/mL (ref 211–911)

## 2021-06-28 LAB — TSH: TSH: 2.15 u[IU]/mL (ref 0.35–5.50)

## 2021-06-28 NOTE — Progress Notes (Cosign Needed)
Assessment/Plan:   Lindsay Herman is a very pleasant 76 y.o. year old RH female with  a history of scleroderma, hypothyroidism, hyperlipidemia, chronic RA on Imuran, ILD likely from scleroderma, history of colon cancer s/p chemotherapy,  seen in neurologic consultation at the request of Bo Merino, MD for the evaluation of memory loss. MoCA today is 20/30 with delayed recall 2/5 suggestive of mild cognitive impairment, likely due to Alzheimer's disease, work-up is in progress.   Recommendations:   Mild Cognitive Impairment   MRI brain with/without contrast to assess for underlying structural abnormality and assess vascular load  Neurocognitive testing to further evaluate cognitive concerns and determine other underlying cause of memory changes, including potential contribution from sleep, anxiety, or depression  Check B12, TSH  Follow up in 1 month   Subjective:    The patient is  The patient is here alone. How long did patient have memory difficulties?  "I had memory problems over the last 15 years "however, he has been worse over the last 5 years, "I brushed it off for years" .  "It is just names and words, but they come to me later ".  She states that there is no learning disabilities that she is aware of.  "Reading and literature are my strengths, math has always been an issue ".  She enjoys doing crossword puzzles and word finding. Patient lives with: Spouse he did not notice problems  repeats oneself? Denies  Disoriented when walking into a room?  Patient denies   Leaving objects in unusual places?  Patient denies   Ambulates  with difficulty?   Patient denies .  During the [place] pandemic, she has not been very active, and is planning to join an exercise program soon. Recent falls?  Patient denies   Any head injuries?  Patient denies   History of seizures?   Patient denies   Wandering behavior?  Patient denies   Patient drives? No issues driving Patient uses GPS for  long distances Any mood changes such irritability agitation?  Patient denies   Any history of depression?:  Patient denies   Hallucinations?  Patient denies   Paranoia?  Patient denies   Patient reports that he sleeps well without vivid dreams, REM behavior or sleepwalking   History of sleep apnea?  Patient denies   Any hygiene concerns?  Patient denies   Independent of bathing and dressing?  Endorsed  Does the patient needs help with medications? Patient denies   Who is in charge of the finances?  Patient is in charge Any changes in appetite?  Patient denies   Patient have trouble swallowing? Patient denies   Does the patient cook?  Patient cooks every night  Any kitchen accidents such as leaving the stove on? Patient denies   Any headaches?  Patient denies   The double vision? Patient denies   Any focal numbness or tingling?  Patient denies   Chronic back pain Patient denies   Unilateral weakness?  Patient denies   Any tremors?  Patient denies   Any history of anosmia?  "My sense of smell is not as strong as before for at least 6 months. No issues with sense of taste " Any incontinence of urine? History of intermittent urinary frequency   Any bowel dysfunction?   Patient denies History of heavy alcohol intake?  Patient denies   History of heavy tobacco use?  Patient denies   Family history of dementia?  Patient denies  Allergies  Allergen Reactions   Codeine Nausea And Vomiting   Morphine Other (See Comments)    Other reaction(s): Unknown   Morphine And Related Nausea And Vomiting    Current Outpatient Medications  Medication Instructions   alendronate (FOSAMAX) 70 mg, Oral, Weekly, Take with a full glass of water on an empty stomach.   azaTHIOprine (IMURAN) 50 MG tablet TAKE 1 TABLET(50 MG) BY MOUTH DAILY   Coenzyme Q10 (COQ10) 200 MG CAPS Oral, Daily   ezetimibe (ZETIA) 10 mg, Oral, Daily   levothyroxine (SYNTHROID) 50 MCG tablet 1 tablet, Oral, Every evening    MAGNESIUM CITRATE PO 250 mg, Oral, Daily   Multiple Vitamins-Minerals (PRESERVISION AREDS 2) CAPS 1 capsule, Oral, 2 times daily   rosuvastatin (CRESTOR) 20 MG tablet TAKE 1 TABLET(20 MG) BY MOUTH DAILY   TURMERIC PO Oral, As directed   Vitamin D (Cholecalciferol) 2,000 Units, Oral, Daily     VITALS:   Vitals:   06/28/21 0754  BP: 129/85  Pulse: 78  Resp: 20  Weight: 163 lb (73.9 kg)  Height: 5' 6.5" (1.689 m)       View : No data to display.          PHYSICAL EXAM   HEENT:  Normocephalic, atraumatic. The mucous membranes are moist. The superficial temporal arteries are without ropiness or tenderness. Cardiovascular: Regular rate and rhythm. Lungs: Clear to auscultation bilaterally. Neck: There are no carotid bruits noted bilaterally.  NEUROLOGICAL:    06/30/2021   11:00 AM  Montreal Cognitive Assessment   Visuospatial/ Executive (0/5) 4  Naming (0/3) 3  Attention: Read list of digits (0/2) 2  Attention: Read list of letters (0/1) 1  Attention: Serial 7 subtraction starting at 100 (0/3) 2  Language: Repeat phrase (0/2) 0  Language : Fluency (0/1) 1  Abstraction (0/2) 0  Delayed Recall (0/5) 2  Orientation (0/6) 5  Total 20  Adjusted Score (based on education) 20        View : No data to display.           Orientation:  Alert and oriented to person, place and time. No aphasia or dysarthria. Fund of knowledge is appropriate. Recent memory impaired and remote memory intact.  Attention and concentration are normal.  Able to name objects and repeat phrases. Delayed recall  2/5 Cranial nerves: There is good facial symmetry. Extraocular muscles are intact and visual fields are full to confrontational testing. Speech is fluent and clear. Soft palate rises symmetrically and there is no tongue deviation. Hearing is intact to conversational tone. Tone: Tone is good throughout. Sensation: Sensation is intact to light touch and pinprick throughout. Vibration is intact at  the bilateral big toe.There is no extinction with double simultaneous stimulation. There is no sensory dermatomal level identified. Coordination: The patient has no difficulty with RAM's or FNF bilaterally. Normal finger to nose  Motor: Strength is 5/5 in the bilateral upper and lower extremities. There is no pronator drift. There are no fasciculations noted. DTR's: Deep tendon reflexes are 2/4 at the bilateral biceps, triceps, brachioradialis, patella and achilles.  Plantar responses are downgoing bilaterally. Gait and Station: The patient is able to ambulate without difficulty.The patient is able to heel toe walk without any difficulty.The patient is able to ambulate in a tandem fashion. The patient is able to stand in the Romberg position.    Thank you for allowing [place] the opportunity to participate in the care of this nice patient. Please do not  hesitate to contact us for any questions or concerns.   Total time spent on today's visit was 60 minutes dedicated to this patient today, preparing to see patient, examining the patient, ordering tests and/or medications and counseling the patient, documenting clinical information in the EHR or other health record, independently interpreting results and communicating results to the patient/family, discussing treatment and goals, answering patient's questions and coordinating care.  Cc:  Burnard Bunting, MD  Sharene Butters 06/30/2021 11:04 AM

## 2021-06-28 NOTE — Patient Instructions (Addendum)
It was a pleasure to see you today at our office.   Recommendations:  Neurocognitive evaluation at our office MRI of the brain, the radiology office will call you to arrange you appointment Check labs today Follow up in  1 month   Whom to call:  Memory  decline, memory medications: Call out office [phone]   For psychiatric meds, mood meds: Please have your primary care physician manage these medications.    For assessment of decision of mental capacity and competency:  Call Dr. Anthoney Harada, geriatric psychiatrist at [phone]  For guidance in geriatric dementia issues please call [facility] [phone]  If you have any severe symptoms of a stroke, or other severe issues such as confusion,severe chills or fever, etc call 911 or go to the ER as you may need to be evaluate further     RECOMMENDATIONS FOR ALL PATIENTS WITH MEMORY PROBLEMS: 1. Continue to exercise (Recommend 30 minutes of walking everyday, or 3 hours every week) 2. Increase social interactions - continue going to [place] and enjoy social gatherings with friends and family 3. Eat healthy, avoid fried foods and eat more fruits and vegetables 4. Maintain adequate blood pressure, blood sugar, and blood cholesterol level. Reducing the risk of stroke and cardiovascular disease also helps promoting better memory. 5. Avoid stressful situations. Live a simple life and avoid aggravations. Organize your time and prepare for the next day in anticipation. 6. Sleep well, avoid any interruptions of sleep and avoid any distractions in the bedroom that may interfere with adequate sleep quality 7. Avoid sugar, avoid sweets as there is a strong link between excessive sugar intake, diabetes, and cognitive impairment We discussed the Mediterranean diet, which has been shown to help patients reduce the risk of progressive memory disorders and reduces cardiovascular risk. This includes eating fish, eat fruits and green  leafy vegetables, nuts like almonds and hazelnuts, walnuts, and also use olive oil. Avoid fast foods and fried foods as much as possible. Avoid sweets and sugar as sugar use has been linked to worsening of memory function.  There is always a concern of gradual progression of memory problems. If this is the case, then we may need to adjust level of care according to patient needs. Support, both to the patient and caregiver, should then be put into place.      You have been referred for a neuropsychological evaluation (i.e., evaluation of memory and thinking abilities). Please bring someone with you to this appointment if possible, as it is helpful for the doctor to hear from both you and another adult who knows you well. Please bring eyeglasses and hearing aids if you wear them.    The evaluation will take approximately 3 hours and has two parts:   The first part is a clinical interview with the neuropsychologist (Dr. Melvyn Novas or Dr. Nicole Kindred). During the interview, the neuropsychologist will speak with you and the individual you brought to the appointment.    The second part of the evaluation is testing with the doctor's technician Hinton Dyer or Maudie Mercury). During the testing, the technician will ask you to remember different types of material, solve problems, and answer some questionnaires. Your family member will not be present for this portion of the evaluation.   Please note: We must reserve several hours of the neuropsychologist's time and the psychometrician's time for your evaluation appointment. As such, there is a No-Show fee of $100. If you are unable to attend any of your appointments, please contact  our office as soon as possible to reschedule.    FALL PRECAUTIONS: Be cautious when walking. Scan the area for obstacles that may increase the risk of trips and falls. When getting up in the mornings, sit up at the edge of the bed for a few minutes before getting out of bed. Consider elevating the bed at  the head end to avoid drop of blood pressure when getting up. Walk always in a well-lit room (use night lights in the walls). Avoid area rugs or power cords from appliances in the middle of the walkways. Use a walker or a cane if necessary and consider physical therapy for balance exercise. Get your eyesight checked regularly.  FINANCIAL OVERSIGHT: Supervision, especially oversight when making financial decisions or transactions is also recommended.  HOME SAFETY: Consider the safety of the kitchen when operating appliances like stoves, microwave oven, and blender. Consider having supervision and share cooking responsibilities until no longer able to participate in those. Accidents with firearms and other hazards in the house should be identified and addressed as well.   ABILITY TO BE LEFT ALONE: If patient is unable to contact 911 operator, consider using Life[facility], or when the need is there, arrange for someone to stay with patients. Smoking is a fire hazard, consider supervision or cessation. Risk of wandering should be assessed by caregiver and if detected at any point, supervision and safe proof recommendations should be instituted.  MEDICATION SUPERVISION: Inability to self-administer medication needs to be constantly addressed. Implement a mechanism to ensure safe administration of the medications.   DRIVING: Regarding driving, in patients with progressive memory problems, driving will be impaired. We advise to have someone else do the driving if trouble finding directions or if minor accidents are reported. Independent driving assessment is available to determine safety of driving.   If you are interested in the driving assessment, you can contact the following:  [employer] in [place]  Hardy Burdette [phone] or [phone]    [place]  refers to food and lifestyle choices that are based on the traditions of countries located on the [employer]. This way of eating has been shown to help prevent certain conditions and improve outcomes for people who have chronic diseases, like kidney disease and heart disease. What are tips for following this plan? Lifestyle  Cook and eat meals together with your family, when possible. Drink enough fluid to keep your urine clear or pale yellow. Be physically active every day. This includes: Aerobic exercise like running or swimming. Leisure activities like gardening, walking, or housework. Get 7-8 hours of sleep each night. If recommended by your health care provider, drink red wine in moderation. This means 1 glass a day for nonpregnant women and 2 glasses a day for men. A glass of wine equals 5 oz (150 mL). Reading food labels  Check the serving size of packaged foods. For foods such as rice and pasta, the serving size refers to the amount of cooked product, not dry. Check the total fat in packaged foods. Avoid foods that have saturated fat or trans fats. Check the ingredients list for added sugars, such as corn syrup. Shopping  At the grocery store, buy most of your food from the areas near the walls of the store. This includes: Fresh fruits and vegetables (produce). Grains, beans, nuts, and seeds. Some of these may be available in unpackaged forms or large amounts (in  bulk). Fresh seafood. Poultry and eggs. Low-fat dairy products. Buy whole ingredients instead of prepackaged foods. Buy fresh fruits and vegetables in-season from local farmers markets. Buy frozen fruits and vegetables in resealable bags. If you do not have access to quality fresh seafood, buy precooked frozen shrimp or canned fish, such as tuna, salmon, or sardines. Buy small amounts of raw or cooked vegetables, salads, or olives from the deli or salad bar at your store. Stock your pantry so you always have certain  foods on hand, such as olive oil, canned tuna, canned tomatoes, rice, pasta, and beans. Cooking  Cook foods with extra-virgin olive oil instead of using butter or other vegetable oils. Have meat as a side dish, and have vegetables or grains as your main dish. This means having meat in small portions or adding small amounts of meat to foods like pasta or stew. Use beans or vegetables instead of meat in common dishes like chili or lasagna. Experiment with different cooking methods. Try roasting or broiling vegetables instead of steaming or sauteing them. Add frozen vegetables to soups, stews, pasta, or rice. Add nuts or seeds for added healthy fat at each meal. You can add these to yogurt, salads, or vegetable dishes. Marinate fish or vegetables using olive oil, lemon juice, garlic, and fresh herbs. Meal planning  Plan to eat 1 vegetarian meal one day each week. Try to work up to 2 vegetarian meals, if possible. Eat seafood 2 or more times a week. Have healthy snacks readily available, such as: Vegetable sticks with hummus. Greek yogurt. Fruit and nut trail mix. Eat balanced meals throughout the week. This includes: Fruit: 2-3 servings a day Vegetables: 4-5 servings a day Low-fat dairy: 2 servings a day Fish, poultry, or lean meat: 1 serving a day Beans and legumes: 2 or more servings a week Nuts and seeds: 1-2 servings a day Whole grains: 6-8 servings a day Extra-virgin olive oil: 3-4 servings a day Limit red meat and sweets to only a few servings a month What are my food choices? Mediterranean diet Recommended Grains: Whole-grain pasta. Brown rice. Bulgar wheat. Polenta. Couscous. Whole-wheat bread. [place]. Vegetables: Artichokes. Beets. Broccoli. Cabbage. Carrots. Eggplant. Green beans. Chard. Kale. Spinach. Onions. Leeks. Peas. Squash. Tomatoes. Peppers. Radishes. Fruits: Apples. Apricots. Avocado. Berries. Bananas. Cherries. Dates. Figs. Grapes. Lemons. Melon. Oranges.  Peaches. Plums. Pomegranate. Meats and other protein foods: Beans. Almonds. Sunflower seeds. Pine nuts. Peanuts. [place]. Salmon. Scallops. Shrimp. [place]. Tilapia. Clams. Oysters. Eggs. Dairy: Low-fat milk. Cheese. Greek yogurt. Beverages: Water. Red wine. Herbal tea. Fats and oils: Extra virgin olive oil. Avocado oil. Grape seed oil. Sweets and desserts: [place] yogurt with honey. Baked apples. Poached pears. Trail mix. Seasoning and other foods: Basil. Cilantro. Coriander. Cumin. Mint. Parsley. Sage. Rosemary. Tarragon. Garlic. Oregano. Thyme. Pepper. Balsalmic vinegar. Tahini. Hummus. Tomato sauce. Olives. Mushrooms. Limit these Grains: Prepackaged pasta or rice dishes. Prepackaged cereal with added sugar. Vegetables: Deep fried potatoes (french fries). Fruits: Fruit canned in syrup. Meats and other protein foods: Beef. Pork. Lamb. Poultry with skin. Hot dogs. [place] Salines. Dairy: Ice cream. Sour cream. Whole milk. Beverages: Juice. Sugar-sweetened soft drinks. Beer. Liquor and spirits. Fats and oils: Butter. Canola oil. Vegetable oil. Beef fat (tallow). Lard. Sweets and desserts: Cookies. Cakes. Pies. Candy. Seasoning and other foods: Mayonnaise. Premade sauces and marinades. The items listed may not be a complete list. Talk with your dietitian about what dietary choices are right for you. Summary The Mediterranean diet includes both food and lifestyle choices.  Eat a variety of fresh fruits and vegetables, beans, nuts, seeds, and whole grains. Limit the amount of red meat and sweets that you eat. Talk with your health care provider about whether it is safe for you to drink red wine in moderation. This means 1 glass a day for nonpregnant women and 2 glasses a day for men. A glass of wine equals 5 oz (150 mL). This information is not intended to replace advice given to you by your health care provider. Make sure you discuss any questions you have with your health care provider. Document Released:  09/20/2015 Document Revised: 10/23/2015 Document Reviewed: 09/20/2015 Elsevier Interactive Patient Education  2017 [employer].

## 2021-06-29 NOTE — Progress Notes (Signed)
Please inform patient her blood tests are normal, thanks

## 2021-06-30 DIAGNOSIS — G3184 Mild cognitive impairment, so stated: Secondary | ICD-10-CM | POA: Insufficient documentation

## 2021-07-02 DIAGNOSIS — H353132 Nonexudative age-related macular degeneration, bilateral, intermediate dry stage: Secondary | ICD-10-CM | POA: Diagnosis not present

## 2021-07-09 ENCOUNTER — Telehealth: Payer: Self-pay

## 2021-07-09 NOTE — Telephone Encounter (Signed)
Patient cancelled all appts for our office, she cancelled MRI brain wo contrast too. FYI

## 2021-07-16 DIAGNOSIS — Z01411 Encounter for gynecological examination (general) (routine) with abnormal findings: Secondary | ICD-10-CM | POA: Diagnosis not present

## 2021-07-16 DIAGNOSIS — Z124 Encounter for screening for malignant neoplasm of cervix: Secondary | ICD-10-CM | POA: Diagnosis not present

## 2021-07-16 DIAGNOSIS — Z01419 Encounter for gynecological examination (general) (routine) without abnormal findings: Secondary | ICD-10-CM | POA: Diagnosis not present

## 2021-07-16 DIAGNOSIS — Z0142 Encounter for cervical smear to confirm findings of recent normal smear following initial abnormal smear: Secondary | ICD-10-CM | POA: Diagnosis not present

## 2021-07-16 DIAGNOSIS — Z6826 Body mass index (BMI) 26.0-26.9, adult: Secondary | ICD-10-CM | POA: Diagnosis not present

## 2021-07-16 DIAGNOSIS — Z1231 Encounter for screening mammogram for malignant neoplasm of breast: Secondary | ICD-10-CM | POA: Diagnosis not present

## 2021-07-16 DIAGNOSIS — N8111 Cystocele, midline: Secondary | ICD-10-CM | POA: Diagnosis not present

## 2021-07-16 DIAGNOSIS — Z90711 Acquired absence of uterus with remaining cervical stump: Secondary | ICD-10-CM | POA: Diagnosis not present

## 2021-07-16 DIAGNOSIS — M810 Age-related osteoporosis without current pathological fracture: Secondary | ICD-10-CM | POA: Diagnosis not present

## 2021-07-29 ENCOUNTER — Ambulatory Visit: Payer: Medicare PPO | Admitting: Physician Assistant

## 2021-07-29 DIAGNOSIS — D2261 Melanocytic nevi of right upper limb, including shoulder: Secondary | ICD-10-CM | POA: Diagnosis not present

## 2021-07-29 DIAGNOSIS — L570 Actinic keratosis: Secondary | ICD-10-CM | POA: Diagnosis not present

## 2021-07-29 DIAGNOSIS — L814 Other melanin hyperpigmentation: Secondary | ICD-10-CM | POA: Diagnosis not present

## 2021-07-29 DIAGNOSIS — D2372 Other benign neoplasm of skin of left lower limb, including hip: Secondary | ICD-10-CM | POA: Diagnosis not present

## 2021-07-29 DIAGNOSIS — B078 Other viral warts: Secondary | ICD-10-CM | POA: Diagnosis not present

## 2021-07-29 DIAGNOSIS — D1801 Hemangioma of skin and subcutaneous tissue: Secondary | ICD-10-CM | POA: Diagnosis not present

## 2021-07-29 DIAGNOSIS — D485 Neoplasm of uncertain behavior of skin: Secondary | ICD-10-CM | POA: Diagnosis not present

## 2021-07-29 DIAGNOSIS — Z85828 Personal history of other malignant neoplasm of skin: Secondary | ICD-10-CM | POA: Diagnosis not present

## 2021-07-29 DIAGNOSIS — L821 Other seborrheic keratosis: Secondary | ICD-10-CM | POA: Diagnosis not present

## 2021-08-19 ENCOUNTER — Other Ambulatory Visit: Payer: Self-pay | Admitting: *Deleted

## 2021-08-19 DIAGNOSIS — Z79899 Other long term (current) drug therapy: Secondary | ICD-10-CM

## 2021-08-19 DIAGNOSIS — I7300 Raynaud's syndrome without gangrene: Secondary | ICD-10-CM

## 2021-08-19 DIAGNOSIS — M349 Systemic sclerosis, unspecified: Secondary | ICD-10-CM | POA: Diagnosis not present

## 2021-08-19 DIAGNOSIS — E8889 Other specified metabolic disorders: Secondary | ICD-10-CM | POA: Diagnosis not present

## 2021-08-19 DIAGNOSIS — M1990 Unspecified osteoarthritis, unspecified site: Secondary | ICD-10-CM | POA: Diagnosis not present

## 2021-08-19 DIAGNOSIS — J849 Interstitial pulmonary disease, unspecified: Secondary | ICD-10-CM

## 2021-08-20 LAB — CBC WITH DIFFERENTIAL/PLATELET
Absolute Monocytes: 706 cells/uL (ref 200–950)
Basophils Absolute: 40 cells/uL (ref 0–200)
Basophils Relative: 0.6 %
Eosinophils Absolute: 40 cells/uL (ref 15–500)
Eosinophils Relative: 0.6 %
HCT: 43.7 % (ref 35.0–45.0)
Hemoglobin: 14.3 g/dL (ref 11.7–15.5)
Lymphs Abs: 785 cells/uL — ABNORMAL LOW (ref 850–3900)
MCH: 31.7 pg (ref 27.0–33.0)
MCHC: 32.7 g/dL (ref 32.0–36.0)
MCV: 96.9 fL (ref 80.0–100.0)
MPV: 11.1 fL (ref 7.5–12.5)
Monocytes Relative: 10.7 %
Neutro Abs: 5029 cells/uL (ref 1500–7800)
Neutrophils Relative %: 76.2 %
Platelets: 298 10*3/uL (ref 140–400)
RBC: 4.51 10*6/uL (ref 3.80–5.10)
RDW: 12.9 % (ref 11.0–15.0)
Total Lymphocyte: 11.9 %
WBC: 6.6 10*3/uL (ref 3.8–10.8)

## 2021-08-20 LAB — COMPLETE METABOLIC PANEL WITH GFR
AG Ratio: 1.9 (calc) (ref 1.0–2.5)
ALT: 14 U/L (ref 6–29)
AST: 17 U/L (ref 10–35)
Albumin: 4.3 g/dL (ref 3.6–5.1)
Alkaline phosphatase (APISO): 49 U/L (ref 37–153)
BUN: 13 mg/dL (ref 7–25)
CO2: 28 mmol/L (ref 20–32)
Calcium: 9.3 mg/dL (ref 8.6–10.4)
Chloride: 106 mmol/L (ref 98–110)
Creat: 0.9 mg/dL (ref 0.60–1.00)
Globulin: 2.3 g/dL (calc) (ref 1.9–3.7)
Glucose, Bld: 95 mg/dL (ref 65–99)
Potassium: 4.7 mmol/L (ref 3.5–5.3)
Sodium: 140 mmol/L (ref 135–146)
Total Bilirubin: 0.4 mg/dL (ref 0.2–1.2)
Total Protein: 6.6 g/dL (ref 6.1–8.1)
eGFR: 66 mL/min/{1.73_m2} (ref >=60)

## 2021-08-20 NOTE — Progress Notes (Signed)
CBC and CMP are normal except lymphocyte count is low due to immunosuppression.  No change in treatment advised.

## 2021-08-29 ENCOUNTER — Ambulatory Visit (INDEPENDENT_AMBULATORY_CARE_PROVIDER_SITE_OTHER): Payer: Medicare PPO | Admitting: Internal Medicine

## 2021-08-29 ENCOUNTER — Ambulatory Visit: Payer: Medicare PPO | Admitting: Internal Medicine

## 2021-08-29 ENCOUNTER — Encounter: Payer: Self-pay | Admitting: Internal Medicine

## 2021-08-29 VITALS — BP 128/78 | HR 72 | Temp 98.3°F | Ht 67.0 in | Wt 163.0 lb

## 2021-08-29 DIAGNOSIS — M359 Systemic involvement of connective tissue, unspecified: Secondary | ICD-10-CM

## 2021-08-29 DIAGNOSIS — J8489 Other specified interstitial pulmonary diseases: Secondary | ICD-10-CM | POA: Diagnosis not present

## 2021-08-29 DIAGNOSIS — M349 Systemic sclerosis, unspecified: Secondary | ICD-10-CM | POA: Diagnosis not present

## 2021-08-29 NOTE — Patient Instructions (Signed)
Spirometry/DLCO performed today. 

## 2021-08-29 NOTE — Progress Notes (Addendum)
OV 11/10/2017  Background history followed by Dr. Estanislado Pandy - Scleroderma, limited ([place]) - Hx sclerodactyly, Raynauds, arthralgias, +ANA, +Ro, +La, +RF ; Off PLQ due to Macular Degenration Concern.  - Raynaud's disease without gangrene-it is not very symptomatic currently.  - Sicca syndrome-she continues to have dry mouth and dry eyes.  The symptoms are tolerable.  - Primary osteoarthritis of both hands-she has some changes consistent with osteoarthritis. - Antibiodies - 10/06/17   - neg CCP and RA   Current history -76 year old lady with the above medical problems.  Known to have scleroderma for at least 10 years according to history.  She tells me that she has had insidious onset of shortness of breath for heavy exertion after doing long walks for a few to several years.  It is stable and has not changed.  She thought this was because of aging.  She does not have scleroderma.  She had pulmonary function test in the past but she does not know the result.  Looking at it it shows reduction in diffusion capacity.  Most recently based on the history and review of rheumatology charts it appears that she started having arthritis in her forearm.  This was in August 2019.  Rheumatoid arthritis work-up was negative on serology.  However because the arthritis was felt to be inflammatory methotrexate was considered.  Because of her previous abnormal pulmonary function test high-resolution CT chest was obtained and it showed probable UIP pattern ILD.  I personally visualized the CT chest and agree with those findings.  Repeat pulmonary function test showed stability.  Therefore she has been referred here.  In the phone conversation with Dr. Keturah Barre I was okay with patient starting Imuran against both arthritis and ILD.  At this point that she is feeling stable.  We asked her to do interstitial lung disease questionnaire.  We sent this to her to her house but she forgot to bring it.  Walking desaturation test was  normal without any desaturations.    [facility] Comprehensive ILD Questionnaire  -Symptoms: She has level 3 dyspnea for walking up stairs or walking up a hill but otherwise does not have dyspnea.  She does not cough does not have a  Past medical history: She is occult positive for rheumatoid arthritis for a few weeks.  She she circled positive for scleroderma limited since 2010 but otherwise negative  Review of systems: Positive for fatigue at times for few years.  Arthralgia for several years.  She has had Raynud for several years.  She says after she retired and not having to rush to school and not being exposed to the outdoor air in the winter it is actually much better and not noticeable.  She does have some snoring but it is controlled with Afrin nasal spray.  Personal exposure history: Started smoking 1968 quit in 1974.  Smoked 8 cigarettes a day.  She has lived with a smoker but denies any use of cigars or marijuana cocaine intravenous drug use of vaping products.  Home exposure history: Single-family home suburban 33 years in the same home age of the home of 72 years  Occupational history: 122 questionnaires positive first living in a condition spaces but otherwise negative.  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 ,  MRN: [id number] , ADDRESS: [place] [address]   10/27/2018 -   Chief Complaint  Patient presents with   ILD (interstitial lung disease)    Discuss results of PFT     ICD-10-CM   1. ILD (interstitial lung disease) ([place])  J84.9   2. Scleroderma ([place])  M34.9      HPI Lindsay Herman 76 y.o. -presents for follow-up of interstitial lung disease secondary to scleroderma.  Since her last visit approximately a year ago she continues to be stable.  Symptom  scores are mild.  SHe is on Imuran through Dr Bo Merino.  She reports no new complaints.  She is social distancing quite well.  She had questions about monitoring of her disease and future therapy.  She had pulmonary function test today that shows continued stability.  Her current symptom scores are mild.    OV 09/08/2019   Subjective:  Patient ID: Lindsay Herman, female , DOB: 08/23/1945, age 76 y.o. years. , MRN: [id number],  ADDRESS: [place] [address] PCP  Burnard Bunting, MD Providers : Treatment Team:  Attending Provider: Brand Males, MD   Chief Complaint  Patient presents with   Follow-up    Pt states she has been doing good since last visit and states she feels like she has been stable. Pt denies any complaints with her breathing.       HPI Lindsay Herman 76 y.o. -follow-up for interstitial lung disease secondary to scleroderma on Imuran.  She has mild stable interstitial lung disease.  Last seen in September 2020.  Since then she continues to be stable.  Symptom scores are listed below and extremely mild.  Infectious somewhat better than before.  She continues on Imuran.  Because of stability we discussed and she would not take antifibrotic's.  Also she has a colectomy.  She tells me that [name] who is a patient of mine now disease from scleroderma ILD was a neighboring) for 44 years.  She is thankful for my care to her.  She does not have any chest pain.  She had a high-resolution CT chest and this documents continued stability of her probably UIP pattern of ILD.  She has scheduled PFT test today but given stability of her CT and symptoms wondering if we can skip it - per HPI  IMPRESSION: 1. There is mild, bibasilar predominant pulmonary fibrosis in a pattern featuring mild tubular bronchiectasis, irregular peripheral interstitial and ground-glass opacity and subtle areas of bronchiolectasis in the lung bases. Fibrotic findings are  not significantly changed compared to prior examination dated 10/19/2017. Findings remain most consistent with a "probable UIP" pattern of fibrosis by ATS pulmonary fibrosis criteria and are potentially consistent with a connective tissue disorder related fibrosis given reported history of scleroderma. Findings are categorized as probable UIP per consensus guidelines: Diagnosis of Idiopathic Pulmonary Fibrosis: An Official ATS/ERS/JRS/ALAT Clinical Practice Guideline. [place], Iss 5, [phone], Oct 11 2016. 2. No CT abnormality of the esophagus given reported history of scleroderma. 3. Cholelithiasis.     Electronically Signed   By: Eddie Candle M.D.   On: 09/01/2019 13:56   ROS - per HPI  OV 09/20/2020  Subjective:  Patient ID: Lindsay Herman, female , DOB: Sep 15, 1945 , age 78 y.o. , MRN: [id number] , ADDRESS: [place] [address] PCP Burnard Bunting, MD Patient Care Team: Burnard Bunting, MD as PCP - General (Internal Medicine) Harrington Challenger,  Carmin Muskrat, MD as PCP - Cardiology (Cardiology)  This Provider for this visit: Treatment Team:  Attending Provider: Brand Males, MD    09/20/2020 -   Chief Complaint  Patient presents with   Follow-up    PFT performed today.  Pt states she has been doing okay since last visit. States she does have some hoarseness first thing in the morning.    interstitial lung disease secondary to scleroderma on Imuran Hx of Colectomy  HPI RAELEIGH GUINN 76 y.o. -returns for her annual follow-up.  She says in the last 1 year respiratory status is stable.  ILD symptom score is 3 in the past it is fluctuated between 1 and 6 so she is in the middle.  There is no cough or any other issue.  However she does have mild hoarseness of voice for the last 1 year and it did get worse.  It is then on and off.  She has never seen ENT.  She is a current non-smoker but a former smoker   She continues on Imuran.  She is  worried about the fact she is immunosuppressed.  She social distances and masks.  Her pulmonary function test shows stability DLCO but her FVC is lower.  She thinks it was a technique issue.  Her walking desaturation test and symptom score is stable.      IMPRESSION: HRCT 1. There is mild, bibasilar predominant pulmonary fibrosis in a pattern featuring mild tubular bronchiectasis, irregular peripheral interstitial and ground-glass opacity and subtle areas of bronchiolectasis in the lung bases. Fibrotic findings are not significantly changed compared to prior examination dated 10/19/2017. Findings remain most consistent with a "probable UIP" pattern of fibrosis by ATS pulmonary fibrosis criteria and are potentially consistent with a connective tissue disorder related fibrosis given reported history of scleroderma. Findings are categorized as probable UIP per consensus guidelines: Diagnosis of Idiopathic Pulmonary Fibrosis: An Official ATS/ERS/JRS/ALAT Clinical Practice Guideline. [place], Iss 5, [id number], Oct 11 2016. 2. No CT abnormality of the esophagus given reported history of scleroderma. 3. Cholelithiasis.     Electronically Signed   By: Eddie Candle M.D.   On: 09/01/2019 13:56    OV 08/29/2021  Subjective:  Patient ID: Lindsay Herman, female , DOB: [birth year] , age 66 y.o. , MRN: [id number] , ADDRESS: [place] [id number] PCP Burnard Bunting, MD Patient Care Team: Burnard Bunting, MD as PCP - General (Internal Medicine) Fay Records, MD as PCP - Cardiology (Cardiology)  This Provider for this visit: Treatment Team:  Attending Provider: Brand Males, MD    08/29/2021 -   Chief Complaint  Patient presents with   Follow-up    Patient has no complaints.     interstitial lung disease secondary to scleroderma on Imuran  -Last high-resolution CT chest July 2021 described as mild probable UIP pattern. Hx of  Colectomy due to colon cancer History of acid reflux disease History of osteopenia on Fosamax HPI DONDRA RHETT 76 y.o. -returns for follow-up.  Last seen in August 2022.  Since then she did see Dr Bo Merino on Jun 19, 2021.  Notes from rheumatology back then noted.  She did say that rheumatology did notice crackles in her lungs.  Patient's feels over the last 1 year she is more short of breath with exertion relieved by rest.  There is no associated chest pain.  There is no cough or wheezing.  No syncope.  She  believes this because for the last 3 years because of the pandemic she has not been exercising and every time she tries to go to the [facility] this scared of another respiratory viral circulation in the community.  Therefore she is more sedentary.  She also during the winters used to walk around [employer] but she is not doing that anymore.  She continues to Imuran.  Review of the records indicate last CT scan of the chest is in 2021.  Last echocardiogram 2018.  Social history: Husband had MI 3 years ago and is more sedentary ECOG is 3-4.  He watching TV all the time and eating all the time.  She is frustrated little bit by this.        SYMPTOM SCALE - ILD 10/27/2018  09/08/2019  09/20/2020  08/29/2021   O2 use ra ra ra ra  Shortness of Breath symptoms 0 -> 5 scale with 5 being worst (score 6 If unable to do) 0    At rest 0 0 0 0  Simple tasks - showers, clothes change, eating, shaving 0 0 0 0  Household (dishes, doing bed, laundry) 1 0 1 2  Shopping 1 0 0 2  Walking level at own pace 1 0 1 2  Walking up Stairs '3 1 1 3  '$ Total (40 - 48) Dyspnea Score '6 1 3 9  '$ How bad is your cough? 0 0 0 0  How bad is your fatigue 3 1 0 3.5  nausea  0 0 0  vomit  0 0 0  diarrhea  0 0 0  anzity  0 0 1  depression  0 0 0     Simple office walk 185 feet x  3 laps goal with forehead probe 09/08/2019 Done with mask 09/20/2020  08/29/2021   O2 used ra ra ra3  Number laps completed '3 3 3   '$ Comments about pace moderate brisk fast  Resting Pulse Ox/HR 96% and 91/min 100% and 91% 98% and HR 100  Final Pulse Ox/HR 93% and 116/min 99% and 121 98% and HR 104  Desaturated </= 88% no no no  Desaturated <= 3% points yes no no  Got Tachycardic >/= 90/min yes yes yes  Symptoms at end of test Moderate dyspnea due to mask Mild dyspnea No dyspnea  Miscellaneous comments x         Latest Ref Rng & Units 08/29/2021    1:54 PM 09/20/2020   12:02 PM 10/27/2018    8:53 AM 11/10/2017    4:02 PM 04/16/2016   12:44 PM  PFT Results  FVC-Pre L 3.03  P 2.95  3.28  3.22  3.14   FVC-Predicted Pre % 96  P 92  100  98  94   FVC-Post L    3.23  3.14   FVC-Predicted Post %    98  94   Pre FEV1/FVC % % 89  P 91  88  90  91   Post FEV1/FCV % %    88  93   FEV1-Pre L 2.69  P 2.67  2.89  2.89  2.87   FEV1-Predicted Pre % 113  P 111  117  117  113   FEV1-Post L    2.85  2.92   DLCO uncorrected ml/min/mmHg 15.61  P 21.14  19.71  18.84  20.08   DLCO UNC% % 74  P 100  92  66  70   DLCO corrected ml/min/mmHg 15.33  P 20.95  DLCO COR %Predicted % 72  P 99      DLVA Predicted % 86  P 114  120  81  79   TLC L    5.04  5.41   TLC % Predicted %    91  98   RV % Predicted %    82  99     P Preliminary result       has a past medical history of Atherosclerosis, Atrophic vaginitis, Cancer ([facility] [place]) (1995), Contact lens/glasses fitting, Cystocele, Nasal fracture, Osteopenia (06/2017), PONV (postoperative nausea and vomiting), Raynaud's disease, Rectocele, Rosacea, Scleroderma ([place]), and Urinary frequency.   reports that she quit smoking about 49 years ago. Her smoking use included cigarettes. She started smoking about 58 years ago. She has a 4.50 pack-year smoking history. She has been exposed to tobacco smoke. She has never used smokeless tobacco.  Past Surgical History:  Procedure Laterality Date   APPENDECTOMY     colon cancer  1995   COLON RESECTION  1995   KNEE ARTHROSCOPY Left 01/14/2019    Procedure: ARTHROSCOPY KNEE, PARTIAL MENISCECTOMY, CHONDROPLASTY AND LOOSE BODY REMOVAL;  Surgeon: Dorna Leitz, MD;  Location: [facility];  Service: Orthopedics;  Laterality: Left;   MASS EXCISION Left 08/31/2012   Procedure: EXCISION MUCOID CYST DEBRIDEMENT DISTAL INTERPHALANGEAL LEFT MIDDLE FINGER;  Surgeon: Wynonia Sours, MD;  Location: [facility];  Service: Orthopedics;  Laterality: Left;   OOPHORECTOMY     LSO   ROTATOR CUFF REPAIR Right    TONSILLECTOMY     VAGINAL HYSTERECTOMY  1994    Allergies  Allergen Reactions   Codeine Nausea And Vomiting   Morphine Other (See Comments)    Other reaction(s): Unknown   Morphine And Related Nausea And Vomiting    Immunization History  Administered Date(s) Administered   Influenza Split 12/12/2010   Influenza, High Dose Seasonal PF 10/24/2017, 11/08/2018   PFIZER(Purple Top)SARS-COV-2 Vaccination 03/18/2019, 04/12/2019, 09/29/2019, 03/27/2020   Pneumococcal Conjugate-13 07/24/2014   Pneumococcal Polysaccharide-23 01/31/2009   Zoster Recombinat (Shingrix) 11/27/2017, 02/12/2018    Family History  Problem Relation Age of Onset   Diabetes Mother    Heart disease Mother    Stroke Mother    Diabetes Brother    Hypertension Brother    Lupus Father      Current Outpatient Medications:    alendronate (FOSAMAX) 70 MG tablet, Take 70 mg by mouth once a week. Take with a full glass of water on an empty stomach., Disp: , Rfl:    azaTHIOprine (IMURAN) 50 MG tablet, TAKE 1 TABLET(50 MG) BY MOUTH DAILY, Disp: 90 tablet, Rfl: 0   Coenzyme Q10 (COQ10) 200 MG CAPS, Take by mouth daily., Disp: , Rfl:    ezetimibe (ZETIA) 10 MG tablet, Take 1 tablet (10 mg total) by mouth daily., Disp: 90 tablet, Rfl: 3   levothyroxine (SYNTHROID) 50 MCG tablet, Take 1 tablet by mouth every evening. , Disp: , Rfl:    MAGNESIUM CITRATE PO, Take 250 mg by mouth daily., Disp: , Rfl:    Multiple Vitamins-Minerals (PRESERVISION AREDS 2)  CAPS, Take 1 capsule by mouth 2 (two) times daily., Disp: , Rfl:    rosuvastatin (CRESTOR) 20 MG tablet, TAKE 1 TABLET(20 MG) BY MOUTH DAILY, Disp: 90 tablet, Rfl: 3   TURMERIC PO, Take by mouth as directed., Disp: , Rfl:    Vitamin D, Cholecalciferol, 25 MCG (1000 UT) CAPS, Take 2,000 Units by mouth daily., Disp: , Rfl:  Objective:   Vitals:   08/29/21 1443  BP: 128/78  Pulse: 72  Temp: 98.3 F (36.8 C)  TempSrc: Oral  SpO2: 100%  Weight: 163 lb (73.9 kg)  Height: '5\' 7"'$  (1.702 m)    Estimated body mass index is 25.53 kg/m as calculated from the following:   Height as of this encounter: '5\' 7"'$  (1.702 m).   Weight as of this encounter: 163 lb (73.9 kg).  '@WEIGHTCHANGE'$ @  [employer]   08/29/21 1443  Weight: 163 lb (73.9 kg)     Physical Exam   General: No distress. Looks well Neuro: Alert and Oriented x 3. GCS 15. Speech normal Psych: Pleasant Resp:  Barrel Chest - no.  Wheeze - no, Crackles - yes mild at base, No overt respiratory distress CVS: Normal heart sounds. Murmurs - no Ext: Stigmata of Connective Tissue Disease -scleroderma but no Raynaud's HEENT: Normal upper airway. PEERL +. No post nasal drip        Assessment:       ICD-10-CM   1. Interstitial lung disease due to connective tissue disease ([place])  J84.89    M35.9     2. Scleroderma ([place])  M34.9          Plan:     Patient Instructions     ICD-10-CM   1. Interstitial lung disease due to connective tissue disease ([place])  J84.89    M35.9     2. Scleroderma ([place])  M34.9     3. Hoarse voice quality  R49.0      You have interstitial lung disease secondary to scleroderma. You seem to have slightly more shortness of breath than in the past  -Possible the disease is getting worse or you have developed pulmonary hypertension or also possibility of physical deconditioning by not exercising in the last few years   Plan -At this time I recommend you continue Imuran that was prescribed by  Dr. Keturah Barre for your joint pain -Get 2D echocardiogram in the next few weeks to few months [last in 2018] -Get high-resolution CT chest supine and prone in the next few weeks to few months [last in 2021] -Get blood work for BNP today -Take contact information for Butch Penny who is the patient support group leader for scleroderma foundation -In future can consider research as a care option  Follow-up - Return to see Dr. Chase Caller 30-minute visit to discuss results of the above investigations    SIGNATURE    Dr. Brand Males, M.D., F.C.C.P,  Pulmonary and Critical Care Medicine Staff Physician, [place] Director - Interstitial Lung Disease  Program  [facility] [place], [place], [address]  Pager: [phone], If no answer or between  15:00h - 7:00h: call [phone] Telephone: [phone]  3:44 PM 08/29/2021

## 2021-08-29 NOTE — Patient Instructions (Addendum)
ICD-10-CM   1. Interstitial lung disease due to connective tissue disease ([place])  J84.89    M35.9     2. Scleroderma ([place])  M34.9     3. Hoarse voice quality  R49.0      You have interstitial lung disease secondary to scleroderma. You seem to have slightly more shortness of breath than in the past  -Possible the disease is getting worse or you have developed pulmonary hypertension or also possibility of physical deconditioning by not exercising in the last few years   Plan -At this time I recommend you continue Imuran that was prescribed by Dr. Keturah Barre for your joint pain -Get 2D echocardiogram in the next few weeks to few months [last in 2018] -Get high-resolution CT chest supine and prone in the next few weeks to few months [last in 2021] -Get blood work for BNP today -Take contact information for Butch Penny who is the patient support group leader for scleroderma foundation -In future can consider research as a care option  Follow-up - Return to see Dr. Chase Caller 30-minute visit to discuss results of the above investigations

## 2021-08-29 NOTE — Progress Notes (Signed)
Spirometry/DLCO performed today. 

## 2021-08-30 ENCOUNTER — Other Ambulatory Visit (INDEPENDENT_AMBULATORY_CARE_PROVIDER_SITE_OTHER): Payer: Medicare PPO

## 2021-08-30 DIAGNOSIS — J8489 Other specified interstitial pulmonary diseases: Secondary | ICD-10-CM

## 2021-08-30 DIAGNOSIS — M359 Systemic involvement of connective tissue, unspecified: Secondary | ICD-10-CM | POA: Diagnosis not present

## 2021-08-30 DIAGNOSIS — M349 Systemic sclerosis, unspecified: Secondary | ICD-10-CM | POA: Diagnosis not present

## 2021-08-30 LAB — BRAIN NATRIURETIC PEPTIDE: Pro B Natriuretic peptide (BNP): 76 pg/mL (ref 0.0–100.0)

## 2021-08-30 NOTE — Addendum Note (Signed)
Addended by: Dessie Coma on: 08/30/2021 11:32 AM   Modules accepted: Orders

## 2021-09-03 ENCOUNTER — Telehealth: Payer: Self-pay | Admitting: Internal Medicine

## 2021-09-05 LAB — PULMONARY FUNCTION TEST
DL/VA % pred: 86 %
DL/VA: 3.45 ml/min/mmHg/L
DLCO cor % pred: 72 %
DLCO cor: 15.33 ml/min/mmHg
DLCO unc % pred: 74 %
DLCO unc: 15.61 ml/min/mmHg
FEF 25-75 Pre: 4.03 L/sec
FEF2575-%Pred-Pre: 225 %
FEV1-%Pred-Pre: 113 %
FEV1-Pre: 2.69 L
FEV1FVC-%Pred-Pre: 118 %
FEV6-%Pred-Pre: 101 %
FEV6-Pre: 3.03 L
FEV6FVC-%Pred-Pre: 104 %
FVC-%Pred-Pre: 96 %
FVC-Pre: 3.03 L
Pre FEV1/FVC ratio: 89 %
Pre FEV6/FVC Ratio: 100 %

## 2021-09-10 ENCOUNTER — Ambulatory Visit (INDEPENDENT_AMBULATORY_CARE_PROVIDER_SITE_OTHER): Payer: Medicare PPO

## 2021-09-10 DIAGNOSIS — M359 Systemic involvement of connective tissue, unspecified: Secondary | ICD-10-CM | POA: Diagnosis not present

## 2021-09-10 DIAGNOSIS — J8489 Other specified interstitial pulmonary diseases: Secondary | ICD-10-CM | POA: Diagnosis not present

## 2021-09-10 DIAGNOSIS — M349 Systemic sclerosis, unspecified: Secondary | ICD-10-CM

## 2021-09-10 LAB — ECHOCARDIOGRAM COMPLETE
AR max vel: 2.13 cm2
AV Area VTI: 2.1 cm2
AV Area mean vel: 2.06 cm2
AV Mean grad: 4 mmHg
AV Peak grad: 8 mmHg
Ao pk vel: 1.41 m/s
Area-P 1/2: 3.91 cm2
S' Lateral: 1.84 cm

## 2021-09-19 NOTE — Progress Notes (Signed)
Very mild heart muscule stiffness that is c;w aging but can cotnribute to dyspnea. Otherwise normal ECHO. Specifically no evidence of pulmonary hypertensin  Please let her know

## 2021-09-22 ENCOUNTER — Other Ambulatory Visit: Payer: Self-pay | Admitting: Physician Assistant

## 2021-09-23 NOTE — Telephone Encounter (Signed)
Nothing further needed at this time. 

## 2021-09-23 NOTE — Telephone Encounter (Signed)
Next Visit: 12/18/2021  Last Visit: 06/19/2021  Last Fill: 06/27/2021  DX: Scleroderma, limited   Current Dose per office note 06/27/2021: Imuran 50 mg po daily  Labs: 08/19/2021 CBC and CMP are normal except lymphocyte count is low due to immunosuppression.  No change in treatment advised.  Okay to refill Imuran?

## 2021-09-24 DIAGNOSIS — S92414A Nondisplaced fracture of proximal phalanx of right great toe, initial encounter for closed fracture: Secondary | ICD-10-CM | POA: Diagnosis not present

## 2021-09-24 DIAGNOSIS — S92414D Nondisplaced fracture of proximal phalanx of right great toe, subsequent encounter for fracture with routine healing: Secondary | ICD-10-CM | POA: Diagnosis not present

## 2021-09-30 ENCOUNTER — Ambulatory Visit (HOSPITAL_COMMUNITY)
Admission: RE | Admit: 2021-09-30 | Discharge: 2021-09-30 | Disposition: A | Discharge status: Home / Self Care | Payer: Medicare PPO | Source: Ambulatory Visit | Attending: Internal Medicine | Admitting: Internal Medicine

## 2021-09-30 DIAGNOSIS — M359 Systemic involvement of connective tissue, unspecified: Secondary | ICD-10-CM | POA: Diagnosis not present

## 2021-09-30 DIAGNOSIS — I2510 Atherosclerotic heart disease of native coronary artery without angina pectoris: Secondary | ICD-10-CM | POA: Diagnosis not present

## 2021-09-30 DIAGNOSIS — M349 Systemic sclerosis, unspecified: Secondary | ICD-10-CM

## 2021-09-30 DIAGNOSIS — J479 Bronchiectasis, uncomplicated: Secondary | ICD-10-CM | POA: Diagnosis not present

## 2021-09-30 DIAGNOSIS — J849 Interstitial pulmonary disease, unspecified: Secondary | ICD-10-CM | POA: Diagnosis not present

## 2021-09-30 DIAGNOSIS — I700 Atherosclerosis of aorta: Secondary | ICD-10-CM | POA: Diagnosis not present

## 2021-09-30 DIAGNOSIS — J8489 Other specified interstitial pulmonary diseases: Secondary | ICD-10-CM | POA: Diagnosis not present

## 2021-10-09 DIAGNOSIS — S92414A Nondisplaced fracture of proximal phalanx of right great toe, initial encounter for closed fracture: Secondary | ICD-10-CM | POA: Diagnosis not present

## 2021-10-09 DIAGNOSIS — S92414D Nondisplaced fracture of proximal phalanx of right great toe, subsequent encounter for fracture with routine healing: Secondary | ICD-10-CM | POA: Diagnosis not present

## 2021-10-16 DIAGNOSIS — E039 Hypothyroidism, unspecified: Secondary | ICD-10-CM | POA: Diagnosis not present

## 2021-10-16 DIAGNOSIS — D649 Anemia, unspecified: Secondary | ICD-10-CM | POA: Diagnosis not present

## 2021-10-16 DIAGNOSIS — I10 Essential (primary) hypertension: Secondary | ICD-10-CM | POA: Diagnosis not present

## 2021-10-16 DIAGNOSIS — R5383 Other fatigue: Secondary | ICD-10-CM | POA: Diagnosis not present

## 2021-10-16 DIAGNOSIS — R7989 Other specified abnormal findings of blood chemistry: Secondary | ICD-10-CM | POA: Diagnosis not present

## 2021-10-16 DIAGNOSIS — E785 Hyperlipidemia, unspecified: Secondary | ICD-10-CM | POA: Diagnosis not present

## 2021-10-21 DIAGNOSIS — Z0000 Encounter for general adult medical examination without abnormal findings: Secondary | ICD-10-CM | POA: Diagnosis not present

## 2021-10-21 DIAGNOSIS — R82998 Other abnormal findings in urine: Secondary | ICD-10-CM | POA: Diagnosis not present

## 2021-10-21 DIAGNOSIS — D649 Anemia, unspecified: Secondary | ICD-10-CM | POA: Diagnosis not present

## 2021-10-21 DIAGNOSIS — M349 Systemic sclerosis, unspecified: Secondary | ICD-10-CM | POA: Diagnosis not present

## 2021-10-21 DIAGNOSIS — R6889 Other general symptoms and signs: Secondary | ICD-10-CM | POA: Diagnosis not present

## 2021-10-21 DIAGNOSIS — Z1339 Encounter for screening examination for other mental health and behavioral disorders: Secondary | ICD-10-CM | POA: Diagnosis not present

## 2021-10-21 DIAGNOSIS — E039 Hypothyroidism, unspecified: Secondary | ICD-10-CM | POA: Diagnosis not present

## 2021-10-21 DIAGNOSIS — J849 Interstitial pulmonary disease, unspecified: Secondary | ICD-10-CM | POA: Diagnosis not present

## 2021-10-21 DIAGNOSIS — Z1331 Encounter for screening for depression: Secondary | ICD-10-CM | POA: Diagnosis not present

## 2021-10-21 DIAGNOSIS — I10 Essential (primary) hypertension: Secondary | ICD-10-CM | POA: Diagnosis not present

## 2021-10-21 DIAGNOSIS — E785 Hyperlipidemia, unspecified: Secondary | ICD-10-CM | POA: Diagnosis not present

## 2021-10-31 ENCOUNTER — Telehealth: Payer: Self-pay | Admitting: Internal Medicine

## 2021-10-31 ENCOUNTER — Ambulatory Visit: Payer: Medicare PPO | Admitting: Internal Medicine

## 2021-10-31 ENCOUNTER — Encounter: Payer: Self-pay | Admitting: Internal Medicine

## 2021-10-31 VITALS — BP 124/70 | HR 71 | Temp 98.0°F | Ht 67.0 in | Wt 164.4 lb

## 2021-10-31 DIAGNOSIS — J8489 Other specified interstitial pulmonary diseases: Secondary | ICD-10-CM

## 2021-10-31 DIAGNOSIS — M349 Systemic sclerosis, unspecified: Secondary | ICD-10-CM

## 2021-10-31 DIAGNOSIS — M359 Systemic involvement of connective tissue, unspecified: Secondary | ICD-10-CM | POA: Diagnosis not present

## 2021-10-31 NOTE — Patient Instructions (Addendum)
ICD-10-CM   1. Interstitial lung disease due to connective tissue disease ([place])  J84.89    M35.9     2. 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 Radiologist thinks fibrosis is mildly worse s2021 -> 2023 but to me the CT scan looks stable  ECHO normal but for mild heart muscle stiffness   Plan -At this time I recommend you continue Imuran that was prescribed by Dr. Keturah Barre for your joint pain - We discussed ofev but based on colectomy history and lack of strong evidence for progression will hold off starting it -Take contact information for Butch Penny who is the patient support group leader for scleroderma foundation - do sprimetry and dlco in 8-10 weeks (will do serial monitoring but more closely)  - anti fibrotic if definitely worse - recommmend stop fosfamax due to aGERD  risk which can make ILD worse - talk t PCP Burnard Bunting, MD    Followup  -8-10 weeks visit; but after spiro/dlco  - walk and ILD symptoms score at followup

## 2021-10-31 NOTE — Progress Notes (Signed)
OV 11/10/2017  Background history followed by Dr. Estanislado Pandy - Scleroderma, limited ([place]) - Hx sclerodactyly, Raynauds, arthralgias, +ANA, +Ro, +La, +RF ; Off PLQ due to Macular Degenration Concern.  - Raynaud's disease without gangrene-it is not very symptomatic currently.  - Sicca syndrome-she continues to have dry mouth and dry eyes.  The symptoms are tolerable.  - Primary osteoarthritis of both hands-she has some changes consistent with osteoarthritis. - Antibiodies - 10/06/17   - neg CCP and RA   Current history -76 year old lady with the above medical problems.  Known to have scleroderma for at least 10 years according to history.  She tells me that she has had insidious onset of shortness of breath for heavy exertion after doing long walks for a few to several years.  It is stable and has not changed.  She thought this was because of aging.  She does not have scleroderma.  She had pulmonary function test in the past but she does not know the result.  Looking at it it shows reduction in diffusion capacity.  Most recently based on the history and review of rheumatology charts it appears that she started having arthritis in her forearm.  This was in August 2019.  Rheumatoid arthritis work-up was negative on serology.  However because the arthritis was felt to be inflammatory methotrexate was considered.  Because of her previous abnormal pulmonary function test high-resolution CT chest was obtained and it showed probable UIP pattern ILD.  I personally visualized the CT chest and agree with those findings.  Repeat pulmonary function test showed stability.  Therefore she has been referred here.  In the phone conversation with Dr. Keturah Barre I was okay with patient starting Imuran against both arthritis and ILD.  At this point that she is feeling stable.  We asked her to do interstitial lung disease questionnaire.  We sent this to her to her house but she forgot to bring it.  Walking desaturation test was  normal without any desaturations.    [facility] Comprehensive ILD Questionnaire  -Symptoms: She has level 3 dyspnea for walking up stairs or walking up a hill but otherwise does not have dyspnea.  She does not cough does not have a  Past medical history: She is occult positive for rheumatoid arthritis for a few weeks.  She she circled positive for scleroderma limited since 2010 but otherwise negative  Review of systems: Positive for fatigue at times for few years.  Arthralgia for several years.  She has had Raynud for several years.  She says after she retired and not having to rush to school and not being exposed to the outdoor air in the winter it is actually much better and not noticeable.  She does have some snoring but it is controlled with Afrin nasal spray.  Personal exposure history: Started smoking 1968 quit in 1974.  Smoked 8 cigarettes a day.  She has lived with a smoker but denies any use of cigars or marijuana cocaine intravenous drug use of vaping products.  Home exposure history: Single-family home suburban 33 years in the same home age of the home of 25 years  Occupational history: 122 questionnaires positive first living in a condition spaces but otherwise negative.  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 ,  MRN: [id number] , ADDRESS: [place] [address]   10/27/2018 -   Chief Complaint  Patient presents with   ILD (interstitial lung disease)    Discuss results of PFT     ICD-10-CM   1. ILD (interstitial lung disease) ([place])  J84.9   2. Scleroderma ([place])  M34.9      HPI Lindsay Herman 76 y.o. -presents for follow-up of interstitial lung disease secondary to scleroderma.  Since her last visit approximately a year ago she continues to be stable.  Symptom  scores are mild.  SHe is on Imuran through Dr Bo Merino.  She reports no new complaints.  She is social distancing quite well.  She had questions about monitoring of her disease and future therapy.  She had pulmonary function test today that shows continued stability.  Her current symptom scores are mild.    OV 09/08/2019   Subjective:  Patient ID: Lindsay Herman, female , DOB: 08/23/1945, age 76 y.o. years. , MRN: [id number],  ADDRESS: [place] [address] PCP  Burnard Bunting, MD Providers : Treatment Team:  Attending Provider: Brand Males, MD   Chief Complaint  Patient presents with   Follow-up    Pt states she has been doing good since last visit and states she feels like she has been stable. Pt denies any complaints with her breathing.       HPI Lindsay Herman 76 y.o. -follow-up for interstitial lung disease secondary to scleroderma on Imuran.  She has mild stable interstitial lung disease.  Last seen in September 2020.  Since then she continues to be stable.  Symptom scores are listed below and extremely mild.  Infectious somewhat better than before.  She continues on Imuran.  Because of stability we discussed and she would not take antifibrotic's.  Also she has a colectomy.  She tells me that [name] who is a patient of mine now disease from scleroderma ILD was a neighboring) for 44 years.  She is thankful for my care to her.  She does not have any chest pain.  She had a high-resolution CT chest and this documents continued stability of her probably UIP pattern of ILD.  She has scheduled PFT test today but given stability of her CT and symptoms wondering if we can skip it - per HPI  IMPRESSION: 1. There is mild, bibasilar predominant pulmonary fibrosis in a pattern featuring mild tubular bronchiectasis, irregular peripheral interstitial and ground-glass opacity and subtle areas of bronchiolectasis in the lung bases. Fibrotic findings are  not significantly changed compared to prior examination dated 10/19/2017. Findings remain most consistent with a "probable UIP" pattern of fibrosis by ATS pulmonary fibrosis criteria and are potentially consistent with a connective tissue disorder related fibrosis given reported history of scleroderma. Findings are categorized as probable UIP per consensus guidelines: Diagnosis of Idiopathic Pulmonary Fibrosis: An Official ATS/ERS/JRS/ALAT Clinical Practice Guideline. [place], Iss 5, [phone], Oct 11 2016. 2. No CT abnormality of the esophagus given reported history of scleroderma. 3. Cholelithiasis.     Electronically Signed   By: Eddie Candle M.D.   On: 09/01/2019 13:56   ROS - per HPI  OV 09/20/2020  Subjective:  Patient ID: Lindsay Herman, female , DOB: Sep 15, 1945 , age 78 y.o. , MRN: [id number] , ADDRESS: [place] [address] PCP Burnard Bunting, MD Patient Care Team: Burnard Bunting, MD as PCP - General (Internal Medicine) Harrington Challenger,  Carmin Muskrat, MD as PCP - Cardiology (Cardiology)  This Provider for this visit: Treatment Team:  Attending Provider: Brand Males, MD    09/20/2020 -   Chief Complaint  Patient presents with   Follow-up    PFT performed today.  Pt states she has been doing okay since last visit. States she does have some hoarseness first thing in the morning.    interstitial lung disease secondary to scleroderma on Imuran Hx of Colectomy  HPI Lindsay Herman 76 y.o. -returns for her annual follow-up.  She says in the last 1 year respiratory status is stable.  ILD symptom score is 3 in the past it is fluctuated between 1 and 6 so she is in the middle.  There is no cough or any other issue.  However she does have mild hoarseness of voice for the last 1 year and it did get worse.  It is then on and off.  She has never seen ENT.  She is a current non-smoker but a former smoker   She continues on Imuran.  She is  worried about the fact she is immunosuppressed.  She social distances and masks.  Her pulmonary function test shows stability DLCO but her FVC is lower.  She thinks it was a technique issue.  Her walking desaturation test and symptom score is stable.      IMPRESSION: HRCT 1. There is mild, bibasilar predominant pulmonary fibrosis in a pattern featuring mild tubular bronchiectasis, irregular peripheral interstitial and ground-glass opacity and subtle areas of bronchiolectasis in the lung bases. Fibrotic findings are not significantly changed compared to prior examination dated 10/19/2017. Findings remain most consistent with a "probable UIP" pattern of fibrosis by ATS pulmonary fibrosis criteria and are potentially consistent with a connective tissue disorder related fibrosis given reported history of scleroderma. Findings are categorized as probable UIP per consensus guidelines: Diagnosis of Idiopathic Pulmonary Fibrosis: An Official ATS/ERS/JRS/ALAT Clinical Practice Guideline. [place], Iss 5, [phone], Oct 11 2016. 2. No CT abnormality of the esophagus given reported history of scleroderma. 3. Cholelithiasis.     Electronically Signed   By: Eddie Candle M.D.   On: 09/01/2019 13:56    OV 08/29/2021  Subjective:  Patient ID: Lindsay Herman, female , DOB: [birth year] , age 84 y.o. , MRN: [id number] , ADDRESS: [place] [id number] PCP Burnard Bunting, MD Patient Care Team: Burnard Bunting, MD as PCP - General (Internal Medicine) Fay Records, MD as PCP - Cardiology (Cardiology)  This Provider for this visit: Treatment Team:  Attending Provider: Brand Males, MD    08/29/2021 -   Chief Complaint  Patient presents with   Follow-up    Patient has no complaints.      HPI Lindsay Herman 76 y.o. -returns for follow-up.  Last seen in August 2022.  Since then she did see Dr Bo Merino on Jun 19, 2021.  Notes from  rheumatology back then noted.  She did say that rheumatology did notice crackles in her lungs.  Patient's feels over the last 1 year she is more short of breath with exertion relieved by rest.  There is no associated chest pain.  There is no cough or wheezing.  No syncope.  She believes this because for the last 3 years because of the pandemic she has not been exercising and every time she tries to go to the [facility] this scared of another respiratory viral circulation in the community.  Therefore she is more sedentary.  She also during the winters used to walk around [employer] but she is not doing that anymore.  She continues to Imuran.  Review of the records indicate last CT scan of the chest is in 2021.  Last echocardiogram 2018.  Social history: Husband had MI 3 years ago and is more sedentary ECOG is 3-4.  He watching TV all the time and eating all the time.  She is frustrated little bit by this.        OV 10/31/2021  Subjective:  Patient ID: Lindsay Herman, female , DOB: November 21, 1945 , age 76 y.o. , MRN: [id number] , ADDRESS: [place] [id number] PCP Burnard Bunting, MD Patient Care Team: Burnard Bunting, MD as PCP - General (Internal Medicine) Fay Records, MD as PCP - Cardiology (Cardiology) Skeet Latch, MD as Attending Physician (Cardiology)  This Provider for this visit: Treatment Team:  Attending Provider: Brand Males, MD    10/31/2021 -   Chief Complaint  Patient presents with   Follow-up    Pt is here today to discuss results of recent CT. Pt states she has been doing okay since last visit and denies any complaints.    interstitial lung disease secondary to scleroderma on Imuran  -Last high-resolution CT chest July 2021 described as mild probable UIP pattern. Hx of Colectomy due to colon cancer History of acid reflux disease History of osteopenia on Fosamax   -Advised to talk to the primary care physician and stop it in September  5885 Grade 1 diastolic dysfunction on echo summer 2023  HPI Lindsay Herman 76 y.o. -returns for follow-up.  At last visit there was concern ILD was getting worse because of symptom score was worse.  Today she tells me she saw it improved spontaneously.  Symptom score still shows slight improvement nevertheless compared to a year ago it slightly worse.  Her echocardiogram shows grade 1 diastolic dysfunction.  She actually told me she did not understand fully what scleroderma meant.  She did not understand her thickening skin and the wrinkles on her face are because of scleroderma.  She says she has been on Imuran for a few years.  She is has history of colectomy but there is no diarrhea.  Our radiologist thought the ILD is slightly worse.  I shared this result with her.  She wanted know about therapeutic options.  We discussed nintedanib but did express concern that given her colectomy she would have diarrhea but at baseline she says she never had any diarrhea.  I personally visualized the scan and compared to 2021 and also 2019.  I personally do not think her ILD is worse now compared to the past.  Her echo has grade 1 diastolic dysfunction.  I noticed that she is on Fosamax which can provoke acid reflux particular in scleroderma and then this in turn can make ILD worse.  I think she should stop this.  Overall we recommended close monitoring because of the side effect profile of approved antifibrotic nintedanib.  The other option would be to do Actemra or CellCept.  CellCept also can cause diarrhea.  Actemra is given subcutaneous and would be a viable option for her but I would need to know if she has at least 10% of lung involvement fibrosis.  I put a message out to the radiologist.  She is already on 1 immunomodulators and so adding another immunomodulator could increase the risk of opportunistic infections.  Therefore  overall taking a more close monitoring approach.    SYMPTOM SCALE - ILD 10/27/2018   09/08/2019  09/20/2020  08/29/2021  10/31/2021   O2 use ra ra ra ra ra  Shortness of Breath symptoms 0 -> 5 scale with 5 being worst (score 6 If unable to do) 0     At rest 0 0 0 0 1  Simple tasks - showers, clothes change, eating, shaving 0 0 0 0 1  Household (dishes, doing bed, laundry) 1 0 '1 2 1  '$ Shopping 1 0 0 2 1  Walking level at own pace 1 0 '1 2 1  '$ Walking up Stairs '3 1 1 3 2  '$ Total (40 - 48) Dyspnea Score '6 1 3 9 7  '$ How bad is your cough? 0 0 0 0 0  How bad is your fatigue 3 1 0 3.5 1  nausea  0 0 0 0  vomit  0 0 0 0  diarrhea  0 0 0 0  anzity  0 0 1 0  depression  0 0 0 0  0 0  Simple office walk 185 feet x  3 laps goal with forehead probe 09/08/2019 Done with mask 09/20/2020  08/29/2021   O2 used ra ra ra  Number laps completed '3 3 3  '$ Comments about pace moderate brisk fast  Resting Pulse Ox/HR 96% and 91/min 100% and 91% 98% and HR 100  Final Pulse Ox/HR 93% and 116/min 99% and 121 98% and HR 104  Desaturated </= 88% no no no  Desaturated <= 3% points yes no no  Got Tachycardic >/= 90/min yes yes yes  Symptoms at end of test Moderate dyspnea due to mask Mild dyspnea No dyspnea  Miscellaneous comments x      PFT     Latest Ref Rng & Units 08/29/2021    1:54 PM 09/20/2020   12:02 PM 10/27/2018    8:53 AM 11/10/2017    4:02 PM 04/16/2016   12:44 PM  PFT Results  FVC-Pre L 3.03  2.95  3.28  3.22  3.14   FVC-Predicted Pre % 96  92  100  98  94   FVC-Post L    3.23  3.14   FVC-Predicted Post %    98  94   Pre FEV1/FVC % % 89  91  88  90  91   Post FEV1/FCV % %    88  93   FEV1-Pre L 2.69  2.67  2.89  2.89  2.87   FEV1-Predicted Pre % 113  111  117  117  113   FEV1-Post L    2.85  2.92   DLCO uncorrected ml/min/mmHg 15.61  21.14  19.71  18.84  20.08   DLCO UNC% % 74  100  92  66  70   DLCO corrected ml/min/mmHg 15.33  20.95      DLCO COR %Predicted % 72  99      DLVA Predicted % 86  114  120  81  79   TLC L    5.04  5.41   TLC % Predicted %    91  98   RV %  Predicted %    82  99     ECHO 09/10/21   IMPRESSIONS     1. Left ventricular ejection fraction, by estimation, is 60 to 65%. Left  ventricular ejection fraction by 3D volume is 60 %. The left ventricle has  normal function. The  left ventricle has no regional wall motion  abnormalities. Left ventricular diastolic   parameters are consistent with Grade I diastolic dysfunction (impaired  relaxation).   2. Right ventricular systolic function is normal. The right ventricular  size is normal. Tricuspid regurgitation signal is inadequate for assessing  PA pressure.   3. The mitral valve is normal in structure. No evidence of mitral valve  regurgitation.   4. The aortic valve is tricuspid. There is mild calcification of the  aortic valve. Aortic valve regurgitation is not visualized. Aortic valve  sclerosis is present, with no evidence of aortic valve stenosis.   5. The inferior vena cava is normal in size with greater than 50%  respiratory variability, suggesting right atrial pressure of 3 mmHg.    Latest Reference Range & Units 08/30/21 13:18  Pro B Natriuretic peptide (BNP) 0.0 - 100.0 pg/mL 76.0    HRCT 09/30/21  Narrative & Impression  CLINICAL DATA:  76 year old female with history of scleroderma. Evaluate for interstitial lung disease.   EXAM: CT CHEST WITHOUT CONTRAST   TECHNIQUE: Multidetector CT imaging of the chest was performed following the standard protocol without intravenous contrast. High resolution imaging of the lungs, as well as inspiratory and expiratory imaging, was performed.   RADIATION DOSE REDUCTION: This exam was performed according to the departmental dose-optimization program which includes automated exposure control, adjustment of the mA and/or kV according to patient size and/or use of iterative reconstruction technique.   COMPARISON:  High-resolution chest CT 09/01/2019.   FINDINGS: Cardiovascular: Heart size is normal. There is no  significant pericardial fluid, thickening or pericardial calcification. There is aortic atherosclerosis, as well as atherosclerosis of the great vessels of the mediastinum and the coronary arteries, including calcified atherosclerotic plaque in the left anterior descending and left circumflex coronary arteries.   Mediastinum/Nodes: No pathologically enlarged mediastinal or hilar lymph nodes. Please note that accurate exclusion of hilar adenopathy is limited on noncontrast CT scans. Esophagus is unremarkable in appearance. No axillary lymphadenopathy.   Lungs/Pleura: High-resolution images demonstrates some areas of ground-glass attenuation ground-glass attenuation, septal thickening, subpleural reticulation, cylindrical bronchiectasis and peripheral bronchiolectasis. These findings are most evident in the extreme lung bases and are mildly progressive compared to the prior examination. No frank honeycombing. Inspiratory and expiratory imaging is unremarkable. No acute consolidative airspace disease. No pleural effusions. No suspicious appearing pulmonary nodules or masses are noted.   Upper Abdomen: Numerous calcified gallstones in the gallbladder measuring up to 1.5 cm in diameter. Atherosclerotic calcifications in the abdominal aorta.   Musculoskeletal: There are no aggressive appearing lytic or blastic lesions noted in the visualized portions of the skeleton.   IMPRESSION: 1. The appearance of the lungs remains compatible with interstitial lung disease, with a spectrum of findings once again categorized as probable usual interstitial pneumonia (UIP) per current ATS guidelines. Minimal progression of disease compared to the prior study from 2001. 2. Aortic atherosclerosis, in addition to two-vessel coronary artery disease. Please note that although the presence of coronary artery calcium documents the presence of coronary artery disease, the severity of this disease and any  potential stenosis cannot be assessed on this non-gated CT examination. Assessment for potential risk factor modification, dietary therapy or pharmacologic therapy may be warranted, if clinically indicated. 3. Cholelithiasis.   Aortic Atherosclerosis (ICD10-I70.0).     Electronically Signed   By: Vinnie Langton M.D.   On: 10/01/2021 11:21       has a past medical history of Atherosclerosis, Atrophic vaginitis, Cancer (  [place]) (1995), Contact lens/glasses fitting, Cystocele, Nasal fracture, Osteopenia (06/2017), PONV (postoperative nausea and vomiting), Raynaud's disease, Rectocele, Rosacea, Scleroderma ([place]), and Urinary frequency.   reports that she quit smoking about 49 years ago. Her smoking use included cigarettes. She started smoking about 58 years ago. She has a 4.50 pack-year smoking history. She has been exposed to tobacco smoke. She has never used smokeless tobacco.  Past Surgical History:  Procedure Laterality Date   APPENDECTOMY     colon cancer  1995   COLON RESECTION  1995   KNEE ARTHROSCOPY Left 01/14/2019   Procedure: ARTHROSCOPY KNEE, PARTIAL MENISCECTOMY, CHONDROPLASTY AND LOOSE BODY REMOVAL;  Surgeon: Dorna Leitz, MD;  Location: [facility];  Service: Orthopedics;  Laterality: Left;   MASS EXCISION Left 08/31/2012   Procedure: EXCISION MUCOID CYST DEBRIDEMENT DISTAL INTERPHALANGEAL LEFT MIDDLE FINGER;  Surgeon: Wynonia Sours, MD;  Location: [facility];  Service: Orthopedics;  Laterality: Left;   OOPHORECTOMY     LSO   ROTATOR CUFF REPAIR Right    TONSILLECTOMY     VAGINAL HYSTERECTOMY  1994    Allergies  Allergen Reactions   Codeine Nausea And Vomiting   Morphine Other (See Comments)    Other reaction(s): Unknown   Morphine And Related Nausea And Vomiting    Immunization History  Administered Date(s) Administered   Influenza Split 12/12/2010   Influenza, High Dose Seasonal PF 10/24/2017, 11/08/2018   PFIZER(Purple  Top)SARS-COV-2 Vaccination 03/18/2019, 04/12/2019, 09/29/2019, 03/27/2020   Pneumococcal Conjugate-13 07/24/2014   Pneumococcal Polysaccharide-23 01/31/2009   Zoster Recombinat (Shingrix) 11/27/2017, 02/12/2018    Family History  Problem Relation Age of Onset   Diabetes Mother    Heart disease Mother    Stroke Mother    Diabetes Brother    Hypertension Brother    Lupus Father      Current Outpatient Medications:    alendronate (FOSAMAX) 70 MG tablet, Take 70 mg by mouth once a week. Take with a full glass of water on an empty stomach., Disp: , Rfl:    azaTHIOprine (IMURAN) 50 MG tablet, TAKE 1 TABLET(50 MG) BY MOUTH DAILY, Disp: 90 tablet, Rfl: 0   Coenzyme Q10 (COQ10) 200 MG CAPS, Take by mouth daily., Disp: , Rfl:    ezetimibe (ZETIA) 10 MG tablet, Take 1 tablet (10 mg total) by mouth daily., Disp: 90 tablet, Rfl: 3   levothyroxine (SYNTHROID) 50 MCG tablet, Take 1 tablet by mouth every evening. , Disp: , Rfl:    MAGNESIUM CITRATE PO, Take 250 mg by mouth daily., Disp: , Rfl:    Multiple Vitamins-Minerals (PRESERVISION AREDS 2) CAPS, Take 1 capsule by mouth 2 (two) times daily., Disp: , Rfl:    rosuvastatin (CRESTOR) 20 MG tablet, TAKE 1 TABLET(20 MG) BY MOUTH DAILY, Disp: 90 tablet, Rfl: 3   TURMERIC PO, Take by mouth as directed., Disp: , Rfl:    Vitamin D, Cholecalciferol, 25 MCG (1000 UT) CAPS, Take 2,000 Units by mouth daily., Disp: , Rfl:       Objective:   Vitals:   10/31/21 1502  BP: 124/70  Pulse: 71  Temp: 98 F (36.7 C)  TempSrc: Oral  SpO2: 97%  Weight: 164 lb 6.4 oz (74.6 kg)  Height: '5\' 7"'$  (1.702 m)    Estimated body mass index is 25.75 kg/m as calculated from the following:   Height as of this encounter: '5\' 7"'$  (1.702 m).   Weight as of this encounter: 164 lb 6.4 oz (74.6 kg).  @  Luan Pulling  Filed Weights   10/31/21 1502  Weight: 164 lb 6.4 oz (74.6 kg)     Physical Exam    General: No distress. Look swell Neuro: Alert and Oriented x 3.  GCS 15. Speech normal Psych: Pleasant Resp:  Barrel Chest - no.  Wheeze - no, Crackles - yes base, No overt respiratory distress CVS: Normal heart sounds. Murmurs - no Ext: Stigmata of Connective Tissue Disease -scleroderma HEENT: Normal upper airway. PEERL +. No post nasal drip        Assessment:       ICD-10-CM   1. Interstitial lung disease due to connective tissue disease ([place])  J84.89 Pulmonary function test   M35.9     2. Scleroderma ([place])  M34.9 Pulmonary function test     Overall we recommended close monitoring because of the side effect profile of approved antifibrotic nintedanib.  The other option would be to do Actemra or CellCept.  CellCept also can cause diarrhea.  Actemra is given subcutaneous and would be a viable option for her but I would need to know if she has at least 10% of lung involvement fibrosis.  I put a message out to the radiologist.  She is already on 1 immunomodulators and so adding another immunomodulator could increase the risk of opportunistic infections.  Therefore overall taking a more close monitoring approach.     Plan:     Patient Instructions     ICD-10-CM   1. Interstitial lung disease due to connective tissue disease ([place])  J84.89    M35.9     2. 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 Radiologist thinks fibrosis is mildly worse s2021 -> 2023 but to me the CT scan looks stable  ECHO normal but for mild heart muscle stiffness   Plan -At this time I recommend you continue Imuran that was prescribed by Dr. Keturah Barre for your joint pain - We discussed ofev but based on colectomy history and lack of strong evidence for progression will hold off starting it -Take contact information for Butch Penny who is the patient support group leader for scleroderma foundation - do sprimetry and dlco in 8-10 weeks (will do serial monitoring but more closely)  - anti fibrotic if definitely worse - recommmend stop  fosfamax due to aGERD  risk which can make ILD worse - talk t PCP Burnard Bunting, MD    Followup  -8-10 weeks visit; but after spiro/dlco  - walk and ILD symptoms score at followup     SIGNATURE    Dr. Brand Males, M.D., F.C.C.P,  Pulmonary and Critical Care Medicine Staff Physician, [place] Director - Interstitial Lung Disease  Program  Pulmonary [place] at [place], [place], [address]  Pager: [phone], If no answer or between  15:00h - 7:00h: call [phone] Telephone: [phone]  3:43 PM 10/31/2021

## 2021-10-31 NOTE — Telephone Encounter (Signed)
     For radiology addendum:   - please call radiologist assistant line [phone] and specify Lindsay Herman , 1946/01/14 and [phone] - mention imaging type HRCT and date 09/30/21 - request addendum for purpose of for consideration of anti-fibrotic/research -> a) Is the amount of Fibrosis at >= 10% level and b) looking for traction bronchiectasis - which is not described. Is it there?   Please send phone message back when done  Thanks    SIGNATURE    Dr. Brand Males, M.D., F.C.C.P,  Pulmonary and Critical Care Medicine Staff Physician, [place] Director - Interstitial Lung Disease  Program  Pulmonary [place] at [place], [place], [address]  Pager: [phone], If no answer  OR between  19:00-7:00h: page [phone] Telephone (clinical office): [phone] Telephone (research): [phone]  3:17 PM 10/31/2021

## 2021-11-01 NOTE — Telephone Encounter (Signed)
Called and spoke with pt letting her know the info from MR that he had recommended her stopping fosamax and she verbalized understanding. Nothing further needed.

## 2021-11-01 NOTE — Telephone Encounter (Signed)
[name] and spoke with Lindsay Herman letting him know of the addendum that needs to be done. Lindsay Herman stated he would send this to radiologist for review. Routing back to MR.

## 2021-11-20 NOTE — Telephone Encounter (Signed)
Per radiology >15% of lung is fibrosed. Wills ee what PFTs in December are and discuss step up Rx

## 2021-12-06 NOTE — Progress Notes (Signed)
Office Visit Note  Patient: Lindsay Herman             Date of Birth: February 03, 1946           MRN: [id number]             PCP: Burnard Bunting, MD Referring: Burnard Bunting, MD Visit Date: 12/18/2021 Occupation: '@GUAROCC'$ @  Subjective:  Medication management  History of Present Illness: Lindsay Herman is a 76 y.o. female with history of limited systemic sclerosis, ILD and osteoarthritis.  She was evaluated by Dr. Chase Caller on October 31, 2021.  According to his note the CT scan looked a stable and echocardiogram was normal with mild heart muscle stiffness.  Dr. Chase Caller recommended to continue Imuran.  He also decided to hold off Ofev for now.  She gets shortness of breath only on exertion.  She is not having discomfort in her hands.  She denies any increased skin tightness.  She is not having any side effects from Fosamax.  She denies any symptoms of reflux.  He recommended stopping Fosamax due to gastroesophageal reflux which can make ILD worse.  Patient was evaluated by Dr. Harrington Challenger on December 25, 2020.  Patient states that she tripped in June and hurt her toes.  She has seen an orthopedic surgeon twice.  She still continues to have some discomfort in her second and third metatarsal region.  She is planning to go for a follow-up visit.  Activities of Daily Living:  Patient reports morning stiffness for 0 minutes.   Patient Denies nocturnal pain.  Difficulty dressing/grooming: Denies Difficulty climbing stairs: Denies Difficulty getting out of chair: Denies Difficulty using hands for taps, buttons, cutlery, and/or writing: Denies  Review of Systems  Constitutional:  Positive for fatigue.  HENT:  Negative for mouth sores and mouth dryness.   Eyes:  Negative for dryness.  Respiratory:  Negative for shortness of breath.   Cardiovascular:  Negative for chest pain and palpitations.  Gastrointestinal:  Negative for blood in stool, constipation and diarrhea.  Endocrine: Negative for  increased urination.  Genitourinary:  Positive for nocturia. Negative for involuntary urination.  Musculoskeletal:  Negative for joint pain, gait problem, joint pain, joint swelling, myalgias, muscle weakness, morning stiffness, muscle tenderness and myalgias.  Skin:  Positive for hair loss. Negative for color change, rash and sensitivity to sunlight.  Allergic/Immunologic: Negative for susceptible to infections.  Neurological:  Negative for dizziness and headaches.  Hematological:  Negative for swollen glands.  Psychiatric/Behavioral:  Negative for depressed mood and sleep disturbance. The patient is not nervous/anxious.     PMFS History:  Patient Active Problem List   Diagnosis Date Noted   Mild cognitive impairment 06/30/2021   Acute medial meniscal tear, left, initial encounter 01/14/2019   Chondromalacia, left knee 01/14/2019   Loose body of left knee 01/14/2019   Scleroderma, limited ([place]) 02/10/2016   History of environmental allergies 02/10/2016   Gastroesophageal reflux disease 02/10/2016   History of colon cancer 02/10/2016   Sicca syndrome 02/01/2016   History of repair of right rotator cuff 02/01/2016   Primary osteoarthritis of both knees 02/01/2016   Primary osteoarthritis of both hands 02/01/2016   Raynaud's disease without gangrene 02/01/2016   Cystocele    Atrophic vaginitis    Osteopenia    Menopausal symptoms     Past Medical History:  Diagnosis Date   Atherosclerosis    aorta and coronary artery   Atrophic vaginitis    Cancer ([place]) 1995   Colon  cancer tx surgery and chemo   Contact lens/glasses fitting    wears contacts or glasses   Cystocele    Nasal fracture    Osteopenia 06/2017   T score -2.0 FRAX 13% / 2.8%   PONV (postoperative nausea and vomiting)    Raynaud's disease    Rectocele    Rosacea    Scleroderma ([place])    Urinary frequency     Family History  Problem Relation Age of Onset   Diabetes Mother    Heart disease Mother    Stroke  Mother    Diabetes Brother    Hypertension Brother    Lupus Father    Past Surgical History:  Procedure Laterality Date   APPENDECTOMY     colon cancer  1995   COLON RESECTION  1995   KNEE ARTHROSCOPY Left 01/14/2019   Procedure: ARTHROSCOPY KNEE, PARTIAL MENISCECTOMY, CHONDROPLASTY AND LOOSE BODY REMOVAL;  Surgeon: Dorna Leitz, MD;  Location: [facility];  Service: Orthopedics;  Laterality: Left;   MASS EXCISION Left 08/31/2012   Procedure: EXCISION MUCOID CYST DEBRIDEMENT DISTAL INTERPHALANGEAL LEFT MIDDLE FINGER;  Surgeon: Wynonia Sours, MD;  Location: [facility];  Service: Orthopedics;  Laterality: Left;   OOPHORECTOMY     LSO   ROTATOR CUFF REPAIR Right    TONSILLECTOMY     VAGINAL HYSTERECTOMY  1994   Social History   Social History Narrative   Right handed   Drinks caffeine   Two story home   Immunization History  Administered Date(s) Administered   Influenza Split 12/12/2010   Influenza, High Dose Seasonal PF 10/24/2017, 11/08/2018   PFIZER(Purple Top)SARS-COV-2 Vaccination 03/18/2019, 04/12/2019, 09/29/2019, 03/27/2020   Pneumococcal Conjugate-13 07/24/2014   Pneumococcal Polysaccharide-23 01/31/2009   Zoster Recombinat (Shingrix) 11/27/2017, 02/12/2018     Objective: Vital Signs: BP 132/81 (BP Location: Left Arm, Patient Position: Sitting, Cuff Size: Normal)   Pulse (!) 59   Resp 15   Ht '5\' 7"'$  (1.702 m)   Wt 164 lb 9.6 oz (74.7 kg)   BMI 25.78 kg/m    Physical Exam Vitals and nursing note reviewed.  Constitutional:      Appearance: She is well-developed.  HENT:     Head: Normocephalic and atraumatic.  Eyes:     Conjunctiva/sclera: Conjunctivae normal.  Cardiovascular:     Rate and Rhythm: Normal rate and regular rhythm.     Heart sounds: Normal heart sounds.  Pulmonary:     Effort: Pulmonary effort is normal.     Breath sounds: Normal breath sounds.     Comments: crackles Abdominal:     General: Bowel sounds are  normal.     Palpations: Abdomen is soft.  Musculoskeletal:     Cervical back: Normal range of motion.  Lymphadenopathy:     Cervical: No cervical adenopathy.  Skin:    General: Skin is warm and dry.     Capillary Refill: Capillary refill takes less than 2 seconds.     Comments: sclerodactyly  Neurological:     Mental Status: She is alert and oriented to person, place, and time.  Psychiatric:        Behavior: Behavior normal.      Musculoskeletal Exam: She had limited range of motion of the cervical spine.  Shoulder joints, elbow joints, wrist joints with good range of motion.  She had bilateral PIP and DIP thickening with no synovitis.  Hip joints and knee joints with good range of motion.  There was no  tenderness over ankles.  She had some tenderness over third and fourth metatarsals but no synovitis was noted.  CDAI Exam: CDAI Score: -- Patient Global: --; Provider Global: -- Swollen: --; Tender: -- Joint Exam 12/18/2021   No joint exam has been documented for this visit   There is currently no information documented on the homunculus. Go to the Rheumatology activity and complete the homunculus joint exam.  Investigation: No additional findings.  Imaging: No results found.  Recent Labs: Lab Results  Component Value Date   WBC 7.0 12/12/2021   HGB 13.8 12/12/2021   PLT 271 12/12/2021   NA 142 12/12/2021   K 5.1 12/12/2021   CL 107 12/12/2021   CO2 29 12/12/2021   GLUCOSE 97 12/12/2021   BUN 12 12/12/2021   CREATININE 0.90 12/12/2021   BILITOT 0.3 12/12/2021   ALKPHOS 64 10/19/2018   AST 17 12/12/2021   ALT 12 12/12/2021   PROT 6.6 12/12/2021   ALBUMIN 4.3 10/19/2018   CALCIUM 9.5 12/12/2021   GFRAA 97 06/14/2020   QFTBGOLDPLUS NEGATIVE 10/06/2017    Speciality Comments: Osteoporosis managed by Dr. Phineas Real.  Procedures:  No procedures performed Allergies: Codeine, Morphine, and Morphine and related   Assessment / Plan:     Visit Diagnoses:  Scleroderma, limited ([place]) - Hx sclerodactyly, Raynauds, arthralgias, ILD, +ANA, +Ro, +La, -RF.  Sclerodactyly this is stable.  She had good capillary refill.  Her Raynauds is currently not very active.  She denies any increase shortness of breath.  She has been followed by Dr. Chase Caller and Dr. Harrington Challenger.   Chronic inflammatory arthritis - RF-, CCP-, 14-3-3 eta negative, R wrist synovitis, erosive changes, intercarpal and radiocarpal joint space narrowing.  No synovitis was noted on the examination today.  Pain in right foot-patient states she tripped on the carpet in June 2023.  She has been followed by orthopedic surgery.  She had no signs of joint swelling today.  She continues to have some discomfort in the base of her third and fourth MTP joint.  She has a follow-up appointment with the orthopedic surgeon.  Interstitial lung disease ([place]) - followed by Dr. Chase Caller and Dorris Carnes.  Patient saw Dr. Chase Caller in September 2023.  I reviewed the note.  He did not recommend any change in treatment.  According to his note the CT scan was stable.  High risk medication use - Imuran 50 mg po daily. Off PLQ due to Macular Degenration Concern.  Labs obtained on December 12, 2021 were reviewed which showed CBC and CMP were stable.  She was advised to get labs every 3 months.  Information regarding immunization was placed in the AVS.  Raynaud's disease without gangrene-keeping core temperature warm and warm clothing was discussed.  TPMT intermediate metabolizer (HCC)  History of repair of right rotator cuff-she had good range of shoulder discomfort.  Primary osteoarthritis of both hands-bilateral.  B and DIP thickening was noted.  No synovitis was noted.  Mild sclerodactyly was noted.  Primary osteoarthritis of both knees-she denies any just comfort today.  S/P arthroscopy of right knee  History of colon cancer  Osteopenia of multiple sites -DEXA scan on March 28, 2019 showed a T score of -2.0, BMD  0.628 in the left femoral neck.  She had been on Fosamax for 2 years by her GYN.  I will schedule repeat DEXA scan.  She was also advised to discontinue Fosamax per Dr. Golden Pop advise as he was concerned that Fosamax can lead to reflux  and may worsen ILD.  If needed we can consider either Reclast or Prolia in the future.  Orders: No orders of the defined types were placed in this encounter.  No orders of the defined types were placed in this encounter.    Follow-Up Instructions: Return in about 3 months (around 03/20/2022) for Scleroderma.   Bo Merino, MD  Note - This record has been created using Editor, commissioning.  Chart creation errors have been sought, but may not always  have been located. Such creation errors do not reflect on  the standard of medical care.

## 2021-12-12 ENCOUNTER — Other Ambulatory Visit: Payer: Self-pay | Admitting: *Deleted

## 2021-12-12 DIAGNOSIS — M349 Systemic sclerosis, unspecified: Secondary | ICD-10-CM

## 2021-12-12 DIAGNOSIS — M1380 Other specified arthritis, unspecified site: Secondary | ICD-10-CM

## 2021-12-12 DIAGNOSIS — J849 Interstitial pulmonary disease, unspecified: Secondary | ICD-10-CM

## 2021-12-12 DIAGNOSIS — Z79899 Other long term (current) drug therapy: Secondary | ICD-10-CM

## 2021-12-12 DIAGNOSIS — I7300 Raynaud's syndrome without gangrene: Secondary | ICD-10-CM

## 2021-12-12 DIAGNOSIS — M1990 Unspecified osteoarthritis, unspecified site: Secondary | ICD-10-CM | POA: Diagnosis not present

## 2021-12-12 DIAGNOSIS — E8889 Other specified metabolic disorders: Secondary | ICD-10-CM

## 2021-12-13 LAB — CBC WITH DIFFERENTIAL/PLATELET
Absolute Monocytes: 819 cells/uL (ref 200–950)
Basophils Absolute: 21 cells/uL (ref 0–200)
Basophils Relative: 0.3 %
Eosinophils Absolute: 28 cells/uL (ref 15–500)
Eosinophils Relative: 0.4 %
HCT: 42.1 % (ref 35.0–45.0)
Hemoglobin: 13.8 g/dL (ref 11.7–15.5)
Lymphs Abs: 812 cells/uL — ABNORMAL LOW (ref 850–3900)
MCH: 31.5 pg (ref 27.0–33.0)
MCHC: 32.8 g/dL (ref 32.0–36.0)
MCV: 96.1 fL (ref 80.0–100.0)
MPV: 11.8 fL (ref 7.5–12.5)
Monocytes Relative: 11.7 %
Neutro Abs: 5320 cells/uL (ref 1500–7800)
Neutrophils Relative %: 76 %
Platelets: 271 10*3/uL (ref 140–400)
RBC: 4.38 10*6/uL (ref 3.80–5.10)
RDW: 12.5 % (ref 11.0–15.0)
Total Lymphocyte: 11.6 %
WBC: 7 10*3/uL (ref 3.8–10.8)

## 2021-12-13 LAB — COMPLETE METABOLIC PANEL WITH GFR
AG Ratio: 1.8 (calc) (ref 1.0–2.5)
ALT: 12 U/L (ref 6–29)
AST: 17 U/L (ref 10–35)
Albumin: 4.2 g/dL (ref 3.6–5.1)
Alkaline phosphatase (APISO): 43 U/L (ref 37–153)
BUN: 12 mg/dL (ref 7–25)
CO2: 29 mmol/L (ref 20–32)
Calcium: 9.5 mg/dL (ref 8.6–10.4)
Chloride: 107 mmol/L (ref 98–110)
Creat: 0.9 mg/dL (ref 0.60–1.00)
Globulin: 2.4 g/dL (calc) (ref 1.9–3.7)
Glucose, Bld: 97 mg/dL (ref 65–99)
Potassium: 5.1 mmol/L (ref 3.5–5.3)
Sodium: 142 mmol/L (ref 135–146)
Total Bilirubin: 0.3 mg/dL (ref 0.2–1.2)
Total Protein: 6.6 g/dL (ref 6.1–8.1)
eGFR: 66 mL/min/{1.73_m2} (ref >=60)

## 2021-12-13 NOTE — Progress Notes (Signed)
CBC and CMP normal.  Lymphocyte count is low due to immunosuppression.  No change in treatment advised.

## 2021-12-18 ENCOUNTER — Encounter: Payer: Self-pay | Admitting: Rheumatology

## 2021-12-18 ENCOUNTER — Ambulatory Visit: Payer: Medicare PPO | Attending: Rheumatology | Admitting: Rheumatology

## 2021-12-18 VITALS — BP 132/81 | HR 59 | Resp 15 | Ht 67.0 in | Wt 164.6 lb

## 2021-12-18 DIAGNOSIS — J849 Interstitial pulmonary disease, unspecified: Secondary | ICD-10-CM

## 2021-12-18 DIAGNOSIS — M19041 Primary osteoarthritis, right hand: Secondary | ICD-10-CM

## 2021-12-18 DIAGNOSIS — I7300 Raynaud's syndrome without gangrene: Secondary | ICD-10-CM | POA: Diagnosis not present

## 2021-12-18 DIAGNOSIS — Z79899 Other long term (current) drug therapy: Secondary | ICD-10-CM

## 2021-12-18 DIAGNOSIS — M1990 Unspecified osteoarthritis, unspecified site: Secondary | ICD-10-CM | POA: Diagnosis not present

## 2021-12-18 DIAGNOSIS — Z8719 Personal history of other diseases of the digestive system: Secondary | ICD-10-CM

## 2021-12-18 DIAGNOSIS — Z9889 Other specified postprocedural states: Secondary | ICD-10-CM | POA: Diagnosis not present

## 2021-12-18 DIAGNOSIS — M349 Systemic sclerosis, unspecified: Secondary | ICD-10-CM

## 2021-12-18 DIAGNOSIS — M170 Bilateral primary osteoarthritis of knee: Secondary | ICD-10-CM

## 2021-12-18 DIAGNOSIS — M79671 Pain in right foot: Secondary | ICD-10-CM

## 2021-12-18 DIAGNOSIS — E8889 Other specified metabolic disorders: Secondary | ICD-10-CM | POA: Diagnosis not present

## 2021-12-18 DIAGNOSIS — M19042 Primary osteoarthritis, left hand: Secondary | ICD-10-CM

## 2021-12-18 DIAGNOSIS — M8589 Other specified disorders of bone density and structure, multiple sites: Secondary | ICD-10-CM

## 2021-12-18 DIAGNOSIS — Z85038 Personal history of other malignant neoplasm of large intestine: Secondary | ICD-10-CM

## 2021-12-18 NOTE — Addendum Note (Signed)
Addended by: Francis Gaines C on: 12/18/2021 12:00 PM   Modules accepted: Orders

## 2021-12-18 NOTE — Patient Instructions (Addendum)
Discontinue Fosamax per Dr. Golden Pop recommendations.  Standing Labs We placed an order today for your standing lab work.   Please have your standing labs drawn in February and every 3 months  Please have your labs drawn 2 weeks prior to your appointment so that the provider can discuss your lab results at your appointment.  Please note that you may see your imaging and lab results in [facility] before we have reviewed them. We will contact you once all results are reviewed. Please allow our office up to 72 hours to thoroughly review all of the results before contacting the office for clarification of your results.  Lab hours are:   Monday through Thursday from 8:00 am -12:30 pm and 1:00 pm-5:00 pm and Friday from 8:00 am-12:00 pm.  Please be advised, all patients with office appointments requiring lab work will take precedent over walk-in lab work.   Labs are drawn by [facility]. Please bring your co-pay at the time of your lab draw.  You may receive a bill from [facility] for your lab work.  Please note if you are on Hydroxychloroquine and and an order has been placed for a Hydroxychloroquine level, you will need to have it drawn 4 hours or more after your last dose.  If you wish to have your labs drawn at another location, please call the office 24 hours in advance so we can fax the orders.  The office is located at [address], [place], [place], [place] [address] No appointment is necessary.    If you have any questions regarding directions or hours of operation,  please call [phone].   As a reminder, please drink plenty of water prior to coming for your lab work. Thanks!    Vaccines You are taking a medication(s) that can suppress your immune system.  The following immunizations are recommended: Flu annually Covid-19  Td/Tdap (tetanus, diphtheria, pertussis) every 10 years Pneumonia (Prevnar 15 then Pneumovax 23 at least 1 year apart.  Alternatively, can take Prevnar 20  without needing additional dose) Shingrix: 2 doses from 4 weeks to 6 months apart  Please check with your PCP to make sure you are up to date.   If you have signs or symptoms of an infection or start antibiotics: First, call your PCP for workup of your infection. Hold your medication through the infection, until you complete your antibiotics, and until symptoms resolve if you take the following: Injectable medication (Actemra, Benlysta, Cimzia, Cosentyx, Enbrel, Humira, Kevzara, Orencia, Remicade, Simponi, Stelara, Taltz, Tremfya) Methotrexate Leflunomide (Arava) Mycophenolate (Cellcept) Morrie Sheldon, Olumiant, or Rinvoq

## 2021-12-19 ENCOUNTER — Other Ambulatory Visit: Payer: Self-pay | Admitting: Physician Assistant

## 2021-12-19 NOTE — Telephone Encounter (Signed)
Next Visit: 03/26/2022  Last Visit: 12/18/2021  Last Fill: 09/23/2021  DX: Scleroderma, limited   Current Dose per office note 12/18/2021: Imuran 50 mg po daily   Labs: 12/12/2021 CBC and CMP normal.  Lymphocyte count is low due to immunosuppression.   Okay to refill Imuran?

## 2022-01-01 ENCOUNTER — Telehealth: Payer: Self-pay | Admitting: *Deleted

## 2022-01-01 NOTE — Telephone Encounter (Signed)
Received DEXA results from [facility].  Date of Scan: 06/14/2021  Lowest T-score:-2.3  BMD:0.593  Lowest site measured:Left Femoral NEck  DX:   Significant changes in BMD and site measured (5% and above):n/a  Current Regimen:Vitamin D. Patient was instructed to stop Fosamax by Dr. Chase Caller.   Recommendation:Discuss at follow up visit.   Reviewed by:Dr. Bo Merino   Next Appointment:  03/29/2022

## 2022-01-07 NOTE — Progress Notes (Unsigned)
Cardiology Office Note   Date:  01/09/2022   ID:  Lindsay Herman, DOB Oct 28, 1945, MRN [id number]  PCP:  Burnard Bunting, MD  Cardiologist:   Dorris Carnes, MD   F/U of CAD   History of Present Illness: Lindsay Herman is a 76 y.o. 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   Felt mild   Rx Imuran   Plan for spirometry f/u and f/u in clinic     The pt has atherosclerosis of aorta and coronary arteries as seen on CT    I saw the pt in 2022  She continues to follow with Drs Estanislado Pandy and Chase Caller     Pt denies CP  Breathing is OK   No dizziness    Current Meds  Medication Sig   azaTHIOprine (IMURAN) 50 MG tablet TAKE 1 TABLET(50 MG) BY MOUTH DAILY   Coenzyme Q10 (COQ10) 200 MG CAPS Take by mouth daily.   ezetimibe (ZETIA) 10 MG tablet Take 1 tablet (10 mg total) by mouth daily.   levothyroxine (SYNTHROID) 50 MCG tablet Take 1 tablet by mouth every evening.    MAGNESIUM CITRATE PO Take 250 mg by mouth daily.   Multiple Vitamins-Minerals (PRESERVISION AREDS 2) CAPS Take 1 capsule by mouth 2 (two) times daily.   rosuvastatin (CRESTOR) 20 MG tablet TAKE 1 TABLET(20 MG) BY MOUTH DAILY   TURMERIC PO Take by mouth 2 (two) times daily.   Vitamin D, Cholecalciferol, 25 MCG (1000 UT) CAPS Take 2,000 Units by mouth daily.     Allergies:   Codeine, Morphine, and Morphine and related   Past Medical History:  Diagnosis Date   Atherosclerosis    aorta and coronary artery   Atrophic vaginitis    Cancer ([place]) 1995   Colon cancer tx surgery and chemo   Contact lens/glasses fitting    wears contacts or glasses   Cystocele    Nasal  fracture    Osteopenia 06/2017   T score -2.0 FRAX 13% / 2.8%   PONV (postoperative nausea and vomiting)    Raynaud's disease    Rectocele    Rosacea    Scleroderma ([place])    Urinary frequency     Past Surgical History:  Procedure Laterality Date   APPENDECTOMY     colon cancer  1995   COLON RESECTION  1995   KNEE ARTHROSCOPY Left 01/14/2019   Procedure: ARTHROSCOPY KNEE, PARTIAL MENISCECTOMY, CHONDROPLASTY AND LOOSE BODY REMOVAL;  Surgeon: Dorna Leitz, MD;  Location: [facility];  Service: Orthopedics;  Laterality: Left;   MASS EXCISION Left 08/31/2012   Procedure: EXCISION MUCOID CYST DEBRIDEMENT DISTAL INTERPHALANGEAL LEFT MIDDLE FINGER;  Surgeon: Wynonia Sours, MD;  Location: [facility];  Service: Orthopedics;  Laterality: Left;   OOPHORECTOMY     LSO   ROTATOR CUFF REPAIR Right    TONSILLECTOMY     VAGINAL HYSTERECTOMY  1994     Social History:  The patient  reports that she quit smoking about 49 years ago. Her smoking use included cigarettes. She started  smoking about 58 years ago. She has a 4.50 pack-year smoking history. She has been exposed to tobacco smoke. She has never used smokeless tobacco. She reports current alcohol use of about 3.0 standard drinks of alcohol per week. She reports that she does not use drugs.   Family History:  The patient's family history includes Diabetes in her brother and mother; Heart disease in her mother; Hypertension in her brother; Lupus in her father; Stroke in her mother.    ROS:  Please see the history of present illness. All other systems are reviewed and  Negative to the above problem except as noted.    PHYSICAL EXAM: VS:  BP 122/74   Pulse 74   Ht 5' 6.5" (1.689 m)   Wt 167 lb (75.8 kg)   BMI 26.55 kg/m   GEN: Well nourished, well developed, in no acute distress  HEENT: normal  Neck: JVP is nromal  NO carotid bruits Cardiac: RRR; no murmurs, no LE  edema  Respiratory:  clear to auscultation  bilaterally,  No ralres GI: soft, nontender, nondistended, + BS  No hepatomegaly  MS: no deformity Moving all extremities   Skin: warm and dry, no rash Neuro:  Strength and sensation are intact Psych: euthymic mood, full affect   EKG:  EKGshows NSR   74 bpm    Echo   09/10/21  Left ventricular ejection fraction, by estimation, is 60 to 65%. Left ventricular ejection fraction by 3D volume is 60 %. The left ventricle has normal function. The left ventricle has no regional wall motion abnormalities. Left ventricular diastolic parameters are consistent with Grade I diastolic dysfunction (impaired relaxation). 1. Right ventricular systolic function is normal. The right ventricular size is normal. Tricuspid regurgitation signal is inadequate for assessing PA pressure. 2. 3. The mitral valve is normal in structure. No evidence of mitral valve regurgitation. The aortic valve is tricuspid. There is mild calcification of the aortic valve. Aortic valve regurgitation is not visualized. Aortic valve sclerosis is present, with no evidence of aortic valve stenosis. 4. The inferior vena cava is normal in size with greater than 50% respiratory variability, suggesting right atrial pressure of 3 mmHg.  Lipid Panel    Component Value Date/Time   CHOL 150 03/27/2021 1001   TRIG 72 03/27/2021 1001   HDL 65 03/27/2021 1001   CHOLHDL 2.3 03/27/2021 1001   LDLCALC 71 03/27/2021 1001      Wt Readings from Last 3 Encounters:  01/09/22 167 lb (75.8 kg)  12/18/21 164 lb 9.6 oz (74.7 kg)  10/31/21 164 lb 6.4 oz (74.6 kg)      ASSESSMENT AND PLAN:  1.  CAD.  Found on cardiac on chest CT.  Remains symptom free      2 dyslipidemia.  Will increase Crestor to 40 mg   Keep on Zetia   watch diet better   Follow up lipomed and Lpa and Liver panel in 12 wks    3.Hx   Interstitial lung disease.  Followed by Chase Caller.  4.  History of rheumatoid arthritis/scleroderma.  Followed by Dr.Deveshwar  F/U in  1 year     Current medicines are reviewed at length with the patient today.  The patient does not have concerns regarding medicines.  Signed, Dorris Carnes, MD  01/09/2022 1:33 PM    [facility] [place], [place], [place]  [address] Phone: [phone]; Fax: [phone]

## 2022-01-09 ENCOUNTER — Ambulatory Visit: Payer: Medicare PPO | Attending: Internal Medicine | Admitting: Internal Medicine

## 2022-01-09 VITALS — BP 122/74 | HR 74 | Ht 66.5 in | Wt 167.0 lb

## 2022-01-09 DIAGNOSIS — Z79899 Other long term (current) drug therapy: Secondary | ICD-10-CM | POA: Diagnosis not present

## 2022-01-09 DIAGNOSIS — E782 Mixed hyperlipidemia: Secondary | ICD-10-CM | POA: Diagnosis not present

## 2022-01-09 DIAGNOSIS — I2510 Atherosclerotic heart disease of native coronary artery without angina pectoris: Secondary | ICD-10-CM

## 2022-01-09 MED ORDER — ROSUVASTATIN CALCIUM 40 MG PO TABS: 40.0000 mg | ORAL_TABLET | Freq: Every day | ORAL | 3 refills | Status: DC

## 2022-01-09 NOTE — Patient Instructions (Signed)
Medication Instructions:  INCREASE YOUR CRESTOR TO 40 MG DAILY  *If you need a refill on your cardiac medications before your next appointment, please call your pharmacy*   Lab Work:  If you have labs (blood work) drawn today and your tests are completely normal, you will receive your results only by: [facility] (if you have MyChart) OR A paper copy in the mail If you have any lab test that is abnormal or we need to change your treatment, we will call you to review the results.   Testing/Procedures: LABS IN 3 MONTHS  NMR, APO B, LIPO A, HEPATIC   I just need a date for the lab when you can come by fasting? You are not bound to a time.. you can come anytime on that date between 7:30 am to 4:30 pm.      Follow-Up: At [facility], you and your health needs are our priority.  As part of our continuing mission to provide you with exceptional heart care, we have created designated Provider Care Teams.  These Care Teams include your primary Cardiologist (physician) and Advanced Practice Providers (APPs -  Physician Assistants and Nurse Practitioners) who all work together to provide you with the care you need, when you need it.  We recommend signing up for the patient portal called "MyChart".  Sign up information is provided on this After Visit Summary.  MyChart is used to connect with patients for Virtual Visits (Telemedicine).  Patients are able to view lab/test results, encounter notes, upcoming appointments, etc.  Non-urgent messages can be sent to your provider as well.   To learn more about what you can do with MyChart, go to [email or web address].    Your next appointment:   12 month(s)  The format for your next appointment:   In Person  Provider:   Dorris Carnes, MD     Other Instructions   Important Information About Sugar

## 2022-01-24 ENCOUNTER — Encounter: Payer: Self-pay | Admitting: Internal Medicine

## 2022-01-24 ENCOUNTER — Ambulatory Visit: Payer: Medicare PPO | Admitting: Internal Medicine

## 2022-01-24 ENCOUNTER — Ambulatory Visit (INDEPENDENT_AMBULATORY_CARE_PROVIDER_SITE_OTHER): Payer: Medicare PPO | Admitting: Internal Medicine

## 2022-01-24 ENCOUNTER — Telehealth: Payer: Self-pay | Admitting: Internal Medicine

## 2022-01-24 VITALS — BP 124/72 | HR 72 | Ht 67.0 in | Wt 165.8 lb

## 2022-01-24 DIAGNOSIS — M349 Systemic sclerosis, unspecified: Secondary | ICD-10-CM | POA: Diagnosis not present

## 2022-01-24 DIAGNOSIS — M359 Systemic involvement of connective tissue, unspecified: Secondary | ICD-10-CM | POA: Diagnosis not present

## 2022-01-24 DIAGNOSIS — Z85038 Personal history of other malignant neoplasm of large intestine: Secondary | ICD-10-CM

## 2022-01-24 DIAGNOSIS — J8489 Other specified interstitial pulmonary diseases: Secondary | ICD-10-CM

## 2022-01-24 DIAGNOSIS — G3184 Mild cognitive impairment, so stated: Secondary | ICD-10-CM

## 2022-01-24 DIAGNOSIS — Z7983 Long term (current) use of bisphosphonates: Secondary | ICD-10-CM | POA: Diagnosis not present

## 2022-01-24 LAB — PULMONARY FUNCTION TEST
DL/VA % pred: 86 %
DL/VA: 3.48 ml/min/mmHg/L
DLCO cor % pred: 71 %
DLCO cor: 14.97 ml/min/mmHg
DLCO unc % pred: 71 %
DLCO unc: 14.97 ml/min/mmHg
FEF 25-75 Pre: 3.64 L/sec
FEF2575-%Pred-Pre: 209 %
FEV1-%Pred-Pre: 112 %
FEV1-Pre: 2.62 L
FEV1FVC-%Pred-Pre: 115 %
FEV6-%Pred-Pre: 102 %
FEV6-Pre: 3.05 L
FEV6FVC-%Pred-Pre: 105 %
FVC-%Pred-Pre: 97 %
FVC-Pre: 3.05 L
Pre FEV1/FVC ratio: 86 %
Pre FEV6/FVC Ratio: 100 %

## 2022-01-24 NOTE — Progress Notes (Signed)
OV 11/10/2017  Background history followed by Dr. Estanislado Herman - Scleroderma, limited ([place]) - Hx sclerodactyly, Raynauds, arthralgias, +ANA, +Ro, +La, +RF ; Off PLQ due to Macular Degenration Concern.  - Raynaud's disease without gangrene-it is not very symptomatic currently.  - Sicca syndrome-she continues to have dry mouth and dry eyes.  The symptoms are tolerable.  - Primary osteoarthritis of both hands-she has some changes consistent with osteoarthritis. - Antibiodies - 10/06/17   - neg CCP and RA   Current history -76 year old lady with the above medical problems.  Known to have scleroderma for at least 10 years according to history.  She tells me that she has had insidious onset of shortness of breath for heavy exertion after doing long walks for a few to several years.  It is stable and has not changed.  She thought this was because of aging.  She does not have scleroderma.  She had pulmonary function test in the past but she does not know the result.  Looking at it it shows reduction in diffusion capacity.  Most recently based on the history and review of rheumatology charts it appears that she started having arthritis in her forearm.  This was in August 2019.  Rheumatoid arthritis work-up was negative on serology.  However because the arthritis was felt to be inflammatory methotrexate was considered.  Because of her previous abnormal pulmonary function test high-resolution CT chest was obtained and it showed probable UIP pattern ILD.  I personally visualized the CT chest and agree with those findings.  Repeat pulmonary function test showed stability.  Therefore she has been referred here.  In the phone conversation with Dr. Keturah Herman I was okay with patient starting Imuran against both arthritis and ILD.  At this point that she is feeling stable.  We asked her to do interstitial lung disease questionnaire.  We sent this to her to her house but she forgot to bring it.  Walking desaturation test was  normal without any desaturations.    [facility] Comprehensive ILD Questionnaire  -Symptoms: She has level 3 dyspnea for walking up stairs or walking up a hill but otherwise does not have dyspnea.  She does not cough does not have a  Past medical history: She is occult positive for rheumatoid arthritis for a few weeks.  She she circled positive for scleroderma limited since 2010 but otherwise negative  Review of systems: Positive for fatigue at times for few years.  Arthralgia for several years.  She has had Raynud for several years.  She says after she retired and not having to rush to school and not being exposed to the outdoor air in the winter it is actually much better and not noticeable.  She does have some snoring but it is controlled with Afrin nasal spray.  Personal exposure history: Started smoking 1968 quit in 1974.  Smoked 8 cigarettes a day.  She has lived with a smoker but denies any use of cigars or marijuana cocaine intravenous drug use of vaping products.  Home exposure history: Single-family home suburban 33 years in the same home age of the home of 72 years  Occupational history: 122 questionnaires positive first living in a condition spaces but otherwise negative.  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 ,  MRN: [id number] , ADDRESS: [place] [address]   10/27/2018 -   Chief Complaint  Patient presents with   ILD (interstitial lung disease)    Discuss results of PFT     ICD-10-CM   1. ILD (interstitial lung disease) ([place])  J84.9   2. Scleroderma ([place])  M34.9      HPI Lindsay Herman 76 y.o. -presents for follow-up of interstitial lung disease secondary to scleroderma.  Since her last visit approximately a year ago she continues to be stable.  Symptom  scores are mild.  SHe is on Imuran through Dr Lindsay Herman.  She reports no new complaints.  She is social distancing quite well.  She had questions about monitoring of her disease and future therapy.  She had pulmonary function test today that shows continued stability.  Her current symptom scores are mild.    OV 09/08/2019   Subjective:  Patient ID: Lindsay Herman, female , DOB: 08/23/1945, age 76 y.o. years. , MRN: [id number],  ADDRESS: [place] [address] PCP  Lindsay Bunting, MD Providers : Treatment Team:  Attending Provider: Brand Males, MD   Chief Complaint  Patient presents with   Follow-up    Pt states she has been doing good since last visit and states she feels like she has been stable. Pt denies any complaints with her breathing.       HPI Lindsay Herman 76 y.o. -follow-up for interstitial lung disease secondary to scleroderma on Imuran.  She has mild stable interstitial lung disease.  Last seen in September 2020.  Since then she continues to be stable.  Symptom scores are listed below and extremely mild.  Infectious somewhat better than before.  She continues on Imuran.  Because of stability we discussed and she would not take antifibrotic's.  Also she has a colectomy.  She tells me that Lindsay Herman who is a patient of mine now disease from scleroderma ILD was a neighboring) for 44 years.  She is thankful for my care to her.  She does not have any chest pain.  She had a high-resolution CT chest and this documents continued stability of her probably UIP pattern of ILD.  She has scheduled PFT test today but given stability of her CT and symptoms wondering if we can skip it - per HPI  IMPRESSION: 1. There is mild, bibasilar predominant pulmonary fibrosis in a pattern featuring mild tubular bronchiectasis, irregular peripheral interstitial and ground-glass opacity and subtle areas of bronchiolectasis in the lung bases. Fibrotic findings are  not significantly changed compared to prior examination dated 10/19/2017. Findings remain most consistent with a "probable UIP" pattern of fibrosis by ATS pulmonary fibrosis criteria and are potentially consistent with a connective tissue disorder related fibrosis given reported history of scleroderma. Findings are categorized as probable UIP per consensus guidelines: Diagnosis of Idiopathic Pulmonary Fibrosis: An Official ATS/ERS/JRS/ALAT Clinical Practice Guideline. [place], Iss 5, [phone], Oct 11 2016. 2. No CT abnormality of the esophagus given reported history of scleroderma. 3. Cholelithiasis.     Electronically Signed   By: Eddie Candle M.D.   On: 09/01/2019 13:56   ROS - per HPI  OV 09/20/2020  Subjective:  Patient ID: Lindsay Herman, female , DOB: Sep 15, 1945 , age 78 y.o. , MRN: [id number] , ADDRESS: [place] [address] PCP Lindsay Bunting, MD Patient Care Team: Lindsay Bunting, MD as PCP - General (Internal Medicine) Harrington Challenger,  Carmin Muskrat, MD as PCP - Cardiology (Cardiology)  This Provider for this visit: Treatment Team:  Attending Provider: Brand Males, MD    09/20/2020 -   Chief Complaint  Patient presents with   Follow-up    PFT performed today.  Pt states she has been doing okay since last visit. States she does have some hoarseness first thing in the morning.    interstitial lung disease secondary to scleroderma on Imuran Hx of Colectomy  HPI Lindsay Herman 76 y.o. -returns for her annual follow-up.  She says in the last 1 year respiratory status is stable.  ILD symptom score is 3 in the past it is fluctuated between 1 and 6 so she is in the middle.  There is no cough or any other issue.  However she does have mild hoarseness of voice for the last 1 year and it did get worse.  It is then on and off.  She has never seen ENT.  She is a current non-smoker but a former smoker   She continues on Imuran.  She is  worried about the fact she is immunosuppressed.  She social distances and masks.  Her pulmonary function test shows stability DLCO but her FVC is lower.  She thinks it was a technique issue.  Her walking desaturation test and symptom score is stable.      IMPRESSION: HRCT 1. There is mild, bibasilar predominant pulmonary fibrosis in a pattern featuring mild tubular bronchiectasis, irregular peripheral interstitial and ground-glass opacity and subtle areas of bronchiolectasis in the lung bases. Fibrotic findings are not significantly changed compared to prior examination dated 10/19/2017. Findings remain most consistent with a "probable UIP" pattern of fibrosis by ATS pulmonary fibrosis criteria and are potentially consistent with a connective tissue disorder related fibrosis given reported history of scleroderma. Findings are categorized as probable UIP per consensus guidelines: Diagnosis of Idiopathic Pulmonary Fibrosis: An Official ATS/ERS/JRS/ALAT Clinical Practice Guideline. [place], Iss 5, [phone], Oct 11 2016. 2. No CT abnormality of the esophagus given reported history of scleroderma. 3. Cholelithiasis.     Electronically Signed   By: Eddie Candle M.D.   On: 09/01/2019 13:56    OV 08/29/2021  Subjective:  Patient ID: Lindsay Herman, female , DOB: [birth year] , age 84 y.o. , MRN: [id number] , ADDRESS: [place] [id number] PCP Lindsay Bunting, MD Patient Care Team: Lindsay Bunting, MD as PCP - General (Internal Medicine) Fay Records, MD as PCP - Cardiology (Cardiology)  This Provider for this visit: Treatment Team:  Attending Provider: Brand Males, MD    08/29/2021 -   Chief Complaint  Patient presents with   Follow-up    Patient has no complaints.      HPI Lindsay Herman 76 y.o. -returns for follow-up.  Last seen in August 2022.  Since then she did see Dr Lindsay Herman on Jun 19, 2021.  Notes from  rheumatology back then noted.  She did say that rheumatology did notice crackles in her lungs.  Patient's feels over the last 1 year she is more short of breath with exertion relieved by rest.  There is no associated chest pain.  There is no cough or wheezing.  No syncope.  She believes this because for the last 3 years because of the pandemic she has not been exercising and every time she tries to go to the [facility] this scared of another respiratory viral circulation in the community.  Therefore she is more sedentary.  She also during the winters used to walk around [employer] but she is not doing that anymore.  She continues to Imuran.  Review of the records indicate last CT scan of the chest is in 2021.  Last echocardiogram 2018.  Social history: Husband had MI 3 years ago and is more sedentary ECOG is 3-4.  He watching TV all the time and eating all the time.  She is frustrated little bit by this.        OV 10/31/2021  Subjective:  Patient ID: Lindsay Herman, female , DOB: Jun 12, 1945 , age 33 y.o. , MRN: [id number] , ADDRESS: [place] [id number] PCP Lindsay Bunting, MD Patient Care Team: Lindsay Bunting, MD as PCP - General (Internal Medicine) Fay Records, MD as PCP - Cardiology (Cardiology) Skeet Latch, MD as Attending Physician (Cardiology)  This Provider for this visit: Treatment Team:  Attending Provider: Brand Males, MD    10/31/2021 -   Chief Complaint  Patient presents with   Follow-up    Pt is here today to discuss results of recent CT. Pt states she has been doing okay since last visit and denies any complaints.    HPI Lindsay Herman 76 y.o. -returns for follow-up.  At last visit there was concern ILD was getting worse because of symptom score was worse.  Today she tells me she saw it improved spontaneously.  Symptom score still shows slight improvement nevertheless compared to a year ago it slightly worse.  Her echocardiogram shows  grade 1 diastolic dysfunction.  She actually told me she did not understand fully what scleroderma meant.  She did not understand her thickening skin and the wrinkles on her face are because of scleroderma.  She says she has been on Imuran for a few years.  She is has history of colectomy but there is no diarrhea.  Our radiologist thought the ILD is slightly worse.  I shared this result with her.  She wanted know about therapeutic options.  We discussed nintedanib but did express concern that given her colectomy she would have diarrhea but at baseline she says she never had any diarrhea.  I personally visualized the scan and compared to 2021 and also 2019.  I personally do not think her ILD is worse now compared to the past.  Her echo has grade 1 diastolic dysfunction.  I noticed that she is on Fosamax which can provoke acid reflux particular in scleroderma and then this in turn can make ILD worse.  I think she should stop this.  Overall we recommended close monitoring because of the side effect profile of approved antifibrotic nintedanib.  The other option would be to do Actemra or CellCept.  CellCept also can cause diarrhea.  Actemra is given subcutaneous and would be a viable option for her but I would need to know if she has at least 10% of lung involvement fibrosis.  I put a message out to the radiologist.  She is already on 1 immunomodulators and so adding another immunomodulator could increase the risk of opportunistic infections.  Therefore overall taking a more close monitoring approach.      OV 01/24/2022  Subjective:  Patient ID: Lindsay Herman, female , DOB: April 18, 1945 , age 76 y.o. , MRN: [id number] , ADDRESS: [place] [id number] PCP Lindsay Bunting, MD Patient Care Team: Lindsay Bunting, MD as PCP - General (Internal Medicine) Fay Records, MD as PCP -  Cardiology (Cardiology) Skeet Latch, MD as Attending Physician (Cardiology)  This Provider for this  visit: Treatment Team:  Attending Provider: Brand Males, MD    01/24/2022 -   Chief Complaint  Patient presents with   Follow-up    PFT performed today.  Pt states she has been doing okay since last visit and denies any complaints.    interstitial lung disease secondary to scleroderma on Imuran  -Last high-resolution CT chest July 2021 described as mild probable UIP pattern. -> Aug 2023 with minimal progession  - 15-20% of involvement of fibrosis latest CT Aug 2023  -Started low-dose nintedanib protocol December 2023 Hx of Colectomy due to colon cancer  -Per Dr. Therisa Doyne December 2023: colonoscopy was in 01/01/22 , a large polyp was removed, she has history of colectomy in 1996 ( sigmoid).  History of acid reflux disease History of osteopenia on Fosamax   -Advised to talk to the primary care physician and stop it in September 8413 Grade 1 diastolic dysfunction on echo summer 2023 Mild cognitive impairment  -Sees neurology: Spring 2023 [facility] cognitive score 20  HPI Lindsay Herman 76 y.o. -returns for follow-up.  At this visit she actually reports stable symptoms.  Her dyspnea score is actually very low.  I did indicate to her that in the past she had given a high score.  She said that she quite could not understand the questions.  Last visit I realized that she did not have a good grasp of interstitial lung disease and scleroderma and I explained it.  This visit I asked her about scleroderma.  She says that all she notices that she has scleroderma and Dr. Wille Glaser sees her for this.  She is not fully sure what the implications of scleroderma are.  She is not aware that she has interstitial lung disease although she does realize that she is seeing me for "a lung problem".  Review of the records indicate that she is having short-term memory loss.  She has seen neurology in the spring 2023 and noticed to have mild cognitive impairment.  She was able to recollect details of colon cancer  from 1996.  Given the scleroderma last visit I told her to stop her Fosamax.  At that point in time she told me that she was still taking her Fosamax.  I then pointed out to the med list that did not show Fosamax.  Then she did not know what Fosamax was for.  Then she said that she absolutely was not taking Fosamax anymore.  I did inquire about her social status.  She lives with her husband.  Husband's had MI in the past.  She states that he spends his night sleeping in the recliner in the TV room.  She has 2 sons one of them lives in [place].  Another 1 lives in [place].  I did ask if she would be interested in bringing them for a future visit.  She did not feel it was necessary.  She wanted to know the benefit of bringing her children to her medical visit.  I did indicate to her that it is generally a good idea to have children involved so the full knowledge of the parents health status but again it would be her decision and her right to disclose.  She says she will update her children about her medical conditions.  Reviewed pulmonary function test the DLCO is definitely declining this year.  CT scan of the chest radiologist also felt there was  a slight decline.  Therefore I did indicate to her that there is evidence for progression in the interstitial lung disease.  In addition with scleroderma according to Dr. Weber Cooks addendum report there is at least 15% of her lungs involved with fibrosis.  Therefore I told her that she meets criteria to start a second medication which is antifibrotic nintedanib.  She does not have any GI issues other than colectomy.  I did contact the GI doctor through secure chat.  Dr. Therisa Doyne did not see any contraindication for nintedanib from a GI standpoint.  I told patient we will start a low-dose protocol.  She is normal liver function and renal function recently.  Some minutes after this I had the research coordinator give her a consent form for ILD-Pro registry.  By this time  she was not fully aware about her interstitial lung disease even though I just discussed this.  So I went back.  She told me she was able to recollect details of interstitial lung disease and our conversation and the decision to start nintedanib and she was fine with it.  She does keep her appointments on time and does follow the instructions.      SYMPTOM SCALE - ILD 10/27/2018  09/08/2019  09/20/2020  08/29/2021  10/31/2021  01/24/2022   O2 use ra ra ra ra ra ra  Shortness of Breath symptoms 0 -> 5 scale with 5 being worst (score 6 If unable to do) 0      At rest 0 0 0 0 1 0  Simple tasks - showers, clothes change, eating, shaving 0 0 0 0 1 00  Household (dishes, doing bed, laundry) 1 0 '1 2 1 '$ 0  Shopping 1 0 0 2 1 0  Walking level at own pace 1 0 '1 2 1 1  '$ Walking up Stairs '3 1 1 3 2 1  '$ Total (40 - 48) Dyspnea Score '6 1 3 9 7 2  '$ How bad is your cough? 0 0 0 0 0 0  How bad is your fatigue 3 1 0 3.5 1 0  nausea  0 0 0 0 0  vomit  0 0 0 0 0  diarrhea  0 0 0 0 0  anzity  0 0 1 0 0  depression  0 0 0 0 0  0 0  Simple office walk 185 feet x  3 laps goal with forehead probe 09/08/2019 Done with mask 09/20/2020  08/29/2021  01/24/2022   O2 used ra ra ra ra  Number laps completed '3 3 3 3  '$ Comments about pace moderate brisk fast avg  Resting Pulse Ox/HR 96% and 91/min 100% and 91% 98% and HR 100 99% and HR 72  Final Pulse Ox/HR 93% and 116/min 99% and 121 98% and HR 104 97% and HR 125  Desaturated </= 88% no no no no  Desaturated <= 3% points yes no no no  Got Tachycardic >/= 90/min yes yes yes yes  Symptoms at end of test Moderate dyspnea due to mask Mild dyspnea No dyspnea Mild dyspnea  Miscellaneous comments x   x     PFT     Latest Ref Rng & Units 01/24/2022    8:38 AM 08/29/2021    1:54 PM 09/20/2020   12:02 PM 10/27/2018    8:53 AM 11/10/2017    4:02 PM 04/16/2016   12:44 PM  PFT Results  FVC-Pre L 3.05  P 3.03  2.95  3.28  3.22  3.14   FVC-Predicted Pre % 97  P 96  92   100  98  94   FVC-Post L     3.23  3.14   FVC-Predicted Post %     98  94   Pre FEV1/FVC % % 86  P 89  91  88  90  91   Post FEV1/FCV % %     88  93   FEV1-Pre L 2.62  P 2.69  2.67  2.89  2.89  2.87   FEV1-Predicted Pre % 112  P 113  111  117  117  113   FEV1-Post L     2.85  2.92   DLCO uncorrected ml/min/mmHg 14.97  P 15.61  21.14  19.71  18.84  20.08   DLCO UNC% % 71  P 74  100  92  66  70   DLCO corrected ml/min/mmHg 14.97  P 15.33  20.95      DLCO COR %Predicted % 71  P 72  99      DLVA Predicted % 86  P 86  114  120  81  79   TLC L     5.04  5.41   TLC % Predicted %     91  98   RV % Predicted %     82  99     P Preliminary result       has a past medical history of Atherosclerosis, Atrophic vaginitis, Cancer (HCC) (1995), Contact lens/glasses fitting, Cystocele, Nasal fracture, Osteopenia (06/2017), PONV (postoperative nausea and vomiting), Raynaud's disease, Rectocele, Rosacea, Scleroderma ([place]), and Urinary frequency.   reports that she quit smoking about 49 years ago. Her smoking use included cigarettes. She started smoking about 58 years ago. She has a 4.50 pack-year smoking history. She has been exposed to tobacco smoke. She has never used smokeless tobacco.  Past Surgical History:  Procedure Laterality Date   APPENDECTOMY     colon cancer  1995   COLON RESECTION  1995   KNEE ARTHROSCOPY Left 01/14/2019   Procedure: ARTHROSCOPY KNEE, PARTIAL MENISCECTOMY, CHONDROPLASTY AND LOOSE BODY REMOVAL;  Surgeon: Dorna Leitz, MD;  Location: [facility];  Service: Orthopedics;  Laterality: Left;   MASS EXCISION Left 08/31/2012   Procedure: EXCISION MUCOID CYST DEBRIDEMENT DISTAL INTERPHALANGEAL LEFT MIDDLE FINGER;  Surgeon: Wynonia Sours, MD;  Location: [facility];  Service: Orthopedics;  Laterality: Left;   OOPHORECTOMY     LSO   ROTATOR CUFF REPAIR Right    TONSILLECTOMY     VAGINAL HYSTERECTOMY  1994    Allergies  Allergen Reactions   Codeine  Nausea And Vomiting   Morphine Other (See Comments)    Other reaction(s): Unknown   Morphine And Related Nausea And Vomiting    Immunization History  Administered Date(s) Administered   Influenza Split 12/12/2010   Influenza, High Dose Seasonal PF 10/24/2017, 11/08/2018   PFIZER(Purple Top)SARS-COV-2 Vaccination 03/18/2019, 04/12/2019, 09/29/2019, 03/27/2020   Pneumococcal Conjugate-13 07/24/2014   Pneumococcal Polysaccharide-23 01/31/2009   Zoster Recombinat (Shingrix) 11/27/2017, 02/12/2018    Family History  Problem Relation Age of Onset   Diabetes Mother    Heart disease Mother    Stroke Mother    Diabetes Brother    Hypertension Brother    Lupus Father      Current Outpatient Medications:    azaTHIOprine (IMURAN) 50 MG tablet, TAKE 1 TABLET(50 MG) BY MOUTH DAILY, Disp: 90 tablet, Rfl: 0  Coenzyme Q10 (COQ10) 200 MG CAPS, Take by mouth daily., Disp: , Rfl:    ezetimibe (ZETIA) 10 MG tablet, Take 1 tablet (10 mg total) by mouth daily., Disp: 90 tablet, Rfl: 3   levothyroxine (SYNTHROID) 50 MCG tablet, Take 1 tablet by mouth every evening. , Disp: , Rfl:    MAGNESIUM CITRATE PO, Take 250 mg by mouth daily., Disp: , Rfl:    Multiple Vitamins-Minerals (PRESERVISION AREDS 2) CAPS, Take 1 capsule by mouth 2 (two) times daily., Disp: , Rfl:    rosuvastatin (CRESTOR) 40 MG tablet, Take 1 tablet (40 mg total) by mouth daily., Disp: 90 tablet, Rfl: 3   TURMERIC PO, Take by mouth 2 (two) times daily., Disp: , Rfl:    Vitamin D, Cholecalciferol, 25 MCG (1000 UT) CAPS, Take 2,000 Units by mouth daily., Disp: , Rfl:       Objective:   Vitals:   01/24/22 0928  BP: 124/72  Pulse: 72  SpO2: 99%  Weight: 165 lb 12.8 oz (75.2 kg)  Height: '5\' 7"'$  (1.702 m)    Estimated body mass index is 25.97 kg/m as calculated from the following:   Height as of this encounter: '5\' 7"'$  (1.702 m).   Weight as of this encounter: 165 lb 12.8 oz (75.2 kg).  '@WEIGHTCHANGE'$ @  Filed Weights    01/24/22 0928  Weight: 165 lb 12.8 oz (75.2 kg)     Physical Exam General: No distress. Looks wel.  Neuro: Alert and Oriented x 3. GCS 15. Speech normal Psych: Pleasant Resp:  Barrel Chest - no.  Wheeze - no, Crackles - mild, No overt respiratory distress CVS: Normal heart sounds. Murmurs - no Ext: Stigmata of Connective Tissue Disease - MILD SSC face and fingts HEENT: Normal upper airway. PEERL +. No post nasal drip        Assessment:       ICD-10-CM   1. Interstitial lung disease due to connective tissue disease ([place])  J84.89    M35.9     2. Scleroderma ([place])  M34.9     3. Mild cognitive impairment  G31.84     4. Personal history of ongoing treatment with alendronate (Fosamax)  Z79.83     5. History of colon cancer  Z85.038          Plan:     Patient Instructions     ICD-10-CM   1. Interstitial lung disease due to connective tissue disease ([place])  J84.89    M35.9     2. Scleroderma ([place])  M34.9     3. Mild cognitive impairment  G31.84     4. Personal history of ongoing treatment with alendronate (Fosamax)  Z79.83     5. History of colon cancer  Z85.038         Interstitial lung disease secondary to scleroderma. - Radiologist thinks fibrosis is mildly worse s2021 -> 2023 and overall 15% of lung is involved with fibrosis - PFT test also suggests that since July 2023 (also dec 2023) that scar tissue likely getting worse  Unclear if you are still on fosfamax or not  - you told me 2 different version but last is you said you stopped it  History colectomy 1990s  - only small portion bowel removed per your history. No GI symptoms currently   Plan -Continue Imuran that was prescribed by Dr. Keturah Herman for your joint pain - We discussed ofev   - start '100mg'$  twice daily  low dose protocol - CMA to inform  PCP Lindsay Bunting, MD and ensure fosafamx stopped - do spirometry/dlco in 12 weeks - Meet Ruch and take ILD-PRO consent for registry   Followup -12 weeks  visit; but after spiro/dlco  - walk and ILD symptoms score at followup     ( Level 05 visit: Estb 40-54 min  in  visit type: on-site physical face to visit  in total care time and counseling or/and coordination of care by this undersigned MD - Dr Lindsay Herman. This includes one or more of the following on this same day 01/24/2022: pre-charting, chart review, note writing, documentation discussion of test results, diagnostic or treatment recommendations, prognosis, risks and benefits of management options, instructions, education, compliance or risk-factor reduction. It excludes time spent by the [facility] or office staff in the care of the patient. Actual time 22 min)   SIGNATURE    Dr. Brand Herman, M.D., F.C.C.P,  Pulmonary and Critical Care Medicine Staff Physician, [place] Director - Interstitial Lung Disease  Program  [facility] [place], [place], [address]  Pager: [phone], If no answer or between  15:00h - 7:00h: call [phone] Telephone: [phone]  10:12 AM 01/24/2022

## 2022-01-24 NOTE — Telephone Encounter (Signed)
Spoke to pt and she needed to know who's financials she should use on a patient assist. form. I advised she use her own unless she has joint income. She stated she did not. Nothing further needed at this time.

## 2022-01-24 NOTE — Progress Notes (Signed)
Spiro/ DLCO completed today  

## 2022-01-24 NOTE — Telephone Encounter (Signed)
Pt was given Med fin assistance form. Needs assistance answering certain questions on form. Pls call to advise. [phone]

## 2022-01-24 NOTE — Patient Instructions (Addendum)
ICD-10-CM   1. Interstitial lung disease due to connective tissue disease ([place])  J84.89    M35.9     2. Scleroderma ([place])  M34.9     3. Mild cognitive impairment  G31.84     4. Personal history of ongoing treatment with alendronate (Fosamax)  Z79.83     5. History of colon cancer  Z85.038         Interstitial lung disease secondary to scleroderma. - Radiologist thinks fibrosis is mildly worse s2021 -> 2023 and overall 15% of lung is involved with fibrosis - PFT test also suggests that since July 2023 (also dec 2023) that scar tissue likely getting worse  Unclear if you are still on fosfamax or not  - you told me 2 different version but last is you said you stopped it  History colectomy 1990s  - only small portion bowel removed per your history. No GI symptoms currently   Plan -Continue Imuran that was prescribed by Dr. Keturah Barre for your joint pain - We discussed ofev   - start '100mg'$  twice daily  low dose protocol - CMA to inform PCP Burnard Bunting, MD and ensure fosafamx stopped - do spirometry/dlco in 12 weeks - Meet [facility]uch and take ILD-PRO consent for registry   Followup -12 weeks visit; but after spiro/dlco  - walk and ILD symptoms score at followup

## 2022-01-27 ENCOUNTER — Other Ambulatory Visit: Payer: Self-pay

## 2022-01-27 ENCOUNTER — Telehealth: Payer: Self-pay

## 2022-01-27 DIAGNOSIS — Z79899 Other long term (current) drug therapy: Secondary | ICD-10-CM

## 2022-01-27 DIAGNOSIS — E782 Mixed hyperlipidemia: Secondary | ICD-10-CM

## 2022-01-27 DIAGNOSIS — I2510 Atherosclerotic heart disease of native coronary artery without angina pectoris: Secondary | ICD-10-CM

## 2022-01-27 MED ORDER — EZETIMIBE 10 MG PO TABS: 10.0000 mg | ORAL_TABLET | Freq: Every day | ORAL | 3 refills | Status: DC

## 2022-01-27 NOTE — Telephone Encounter (Signed)
Submitted a Prior Authorization request to [facility] for OFEV '100mg'$  via CoverMyMeds. Awaiting clinical questions to populate.  Key: Jeani Sow, PharmD, MPH, BCPS, CPP Clinical Pharmacist (Rheumatology and Pulmonology)

## 2022-01-27 NOTE — Telephone Encounter (Signed)
Received New start paperwork for OFEV. Will update as we work through the benefits process.  

## 2022-02-21 ENCOUNTER — Other Ambulatory Visit: Payer: Self-pay | Admitting: Internal Medicine

## 2022-02-26 NOTE — Telephone Encounter (Signed)
Called [employer] for update on Ofev PA status. Restarted URGENT PA for Ofev. Clinicals faxed to [facility] today. Turnaround time is 24 hours  Fax: [phone]  EOC ID # [id number]  Phone: [phone]  Knox Saliva, PharmD, MPH, BCPS, CPP Clinical Pharmacist (Rheumatology and Pulmonology)

## 2022-02-27 ENCOUNTER — Other Ambulatory Visit (HOSPITAL_COMMUNITY): Payer: Self-pay

## 2022-02-27 NOTE — Telephone Encounter (Signed)
Received fax from [facility] stating no Pa is required for Ofev '100mg'$ , it is on formulary.  Ran test claim, patient's copay is $100 for 1 month supply.

## 2022-02-28 NOTE — Telephone Encounter (Signed)
I spoke with patient about $100 copay for Ofev. She'd like to pursue patient assistance since cost is still tight for their budget. Submitted Patient Assistance Application to [employer] for OFEV along with provider portion, patient portion, med list, insurance card copy, PA and income documents. Will update patient when we receive a response.  Fax# [phone] Phone# [phone]  Knox Saliva, PharmD, MPH, BCPS, CPP Clinical Pharmacist (Rheumatology and Pulmonology)

## 2022-03-06 ENCOUNTER — Telehealth: Payer: Self-pay | Admitting: Internal Medicine

## 2022-03-10 ENCOUNTER — Telehealth: Payer: Self-pay | Admitting: Pharmacist

## 2022-03-10 DIAGNOSIS — J8489 Other specified interstitial pulmonary diseases: Secondary | ICD-10-CM

## 2022-03-10 DIAGNOSIS — M349 Systemic sclerosis, unspecified: Secondary | ICD-10-CM

## 2022-03-10 MED ORDER — OFEV 100 MG PO CAPS: 100.0000 mg | ORAL_CAPSULE | Freq: Two times a day (BID) | ORAL | 1 refills | Status: DC

## 2022-03-10 NOTE — Telephone Encounter (Signed)
Called [employer] for update on Ofev pt assistance status - pt is approved but would like approval letter sent to clinic. Patient received 3 month supply at home with loperamide. Per [employer] rep, patient is approved from 02/28/2022 through 02/10/2023. Rep will refax approval letter to clinic.  Phone# [phone]  Knox Saliva, PharmD, MPH, BCPS, CPP Clinical Pharmacist (Rheumatology and Pulmonology)

## 2022-03-10 NOTE — Telephone Encounter (Signed)
Returned call to patient and provided Ofev counseling (documented in separate telephone encounter)  Knox Saliva, PharmD, MPH, BCPS, CPP Clinical Pharmacist (Rheumatology and Pulmonology)

## 2022-03-10 NOTE — Telephone Encounter (Signed)
Subjective:  Patient called today by [facility] Pulmonary pharmacy team for Ofev new start counseling.   She has SSc-ILD. Patient was last seen by Dr. Chase Caller on 01/24/2022. Pertinent past medical history includes Scleroderma with Raynaud's, sicca syndrome, GERD, history of colon cancer. Scleroderma is managed by Dr . Estanislado Pandy with rheum. She takes azathioprine '50mg'$  daily for scleroderma.  History of CAD: No History of MI: No Current anticoagulant use: No History of HTN: No  History of diarrhea, nausea, vomiting: No  Objective: Allergies  Allergen Reactions   Codeine Nausea And Vomiting   Morphine Other (See Comments)    Other reaction(s): Unknown   Morphine And Related Nausea And Vomiting    Outpatient Encounter Medications as of 03/10/2022  Medication Sig   azaTHIOprine (IMURAN) 50 MG tablet TAKE 1 TABLET(50 MG) BY MOUTH DAILY   Coenzyme Q10 (COQ10) 200 MG CAPS Take by mouth daily.   ezetimibe (ZETIA) 10 MG tablet Take 1 tablet (10 mg total) by mouth daily.   levothyroxine (SYNTHROID) 50 MCG tablet Take 1 tablet by mouth every evening.    MAGNESIUM CITRATE PO Take 250 mg by mouth daily.   Multiple Vitamins-Minerals (PRESERVISION AREDS 2) CAPS Take 1 capsule by mouth 2 (two) times daily.   rosuvastatin (CRESTOR) 20 MG tablet Take 2 tablets (40 mg total) by mouth daily.   rosuvastatin (CRESTOR) 40 MG tablet Take 1 tablet (40 mg total) by mouth daily.   TURMERIC PO Take by mouth 2 (two) times daily.   Vitamin D, Cholecalciferol, 25 MCG (1000 UT) CAPS Take 2,000 Units by mouth daily.   No facility-administered encounter medications on file as of 03/10/2022.     Immunization History  Administered Date(s) Administered   Influenza Split 12/12/2010   Influenza, High Dose Seasonal PF 10/24/2017, 11/08/2018   PFIZER(Purple Top)SARS-COV-2 Vaccination 03/18/2019, 04/12/2019, 09/29/2019, 03/27/2020   Pneumococcal Conjugate-13 07/24/2014   Pneumococcal Polysaccharide-23 01/31/2009    Zoster Recombinat (Shingrix) 11/27/2017, 02/12/2018      PFT's TLC  Date Value Ref Range Status  11/10/2017 5.04 L Final      CMP     Component Value Date/Time   NA 142 12/12/2021 1425   K 5.1 12/12/2021 1425   CL 107 12/12/2021 1425   CO2 29 12/12/2021 1425   GLUCOSE 97 12/12/2021 1425   BUN 12 12/12/2021 1425   CREATININE 0.90 12/12/2021 1425   CALCIUM 9.5 12/12/2021 1425   PROT 6.6 12/12/2021 1425   PROT 6.6 10/19/2018 0750   ALBUMIN 4.3 10/19/2018 0750   AST 17 12/12/2021 1425   ALT 12 12/12/2021 1425   ALKPHOS 64 10/19/2018 0750   BILITOT 0.3 12/12/2021 1425   BILITOT 0.4 10/19/2018 0750   GFRNONAA 83 06/14/2020 1356   GFRAA 97 06/14/2020 1356      CBC    Component Value Date/Time   WBC 7.0 12/12/2021 1425   RBC 4.38 12/12/2021 1425   HGB 13.8 12/12/2021 1425   HCT 42.1 12/12/2021 1425   PLT 271 12/12/2021 1425   MCV 96.1 12/12/2021 1425   MCH 31.5 12/12/2021 1425   MCHC 32.8 12/12/2021 1425   RDW 12.5 12/12/2021 1425   LYMPHSABS 812 (L) 12/12/2021 1425   MONOABS 980 (H) 02/12/2016 0939   EOSABS 28 12/12/2021 1425   BASOSABS 21 12/12/2021 1425      LFT's    Latest Ref Rng & Units 12/12/2021    2:25 PM 08/19/2021    1:42 PM 06/14/2021    3:40 PM  Hepatic  Function  Total Protein 6.1 - 8.1 g/dL 6.6  6.6  6.3   AST 10 - 35 U/L '17  17  19   '$ ALT 6 - 29 U/L '12  14  14   '$ Total Bilirubin 0.2 - 1.2 mg/dL 0.3  0.4  0.3     HRCT (11/02/2021) - Estimated percentage of fibrosis is 15-20 percent of the lung involved with the mild fibrotic changes, including the areas of cylindrical bronchiectasis as originally described in the report.  Assessment and Plan  Ofev Medication Management Thoroughly counseled patient on the efficacy, mechanism of action, dosing, administration, adverse effects, and monitoring parameters of Ofev. Patient verbalized understanding.  Goals of Therapy: Will not stop or reverse the progression of ILD. It will slow the progression of  ILD.  Inhibits tyrosine kinase inhibitors which slow the fibrosis/progression of ILD -Significant reduction in the rate of disease progression was observed after treatment (61.1% [before] vs 33.3% [after], P?=?0.008) over 42 weeks.  Dosing: 100 mg (one capsule) by mouth twice daily (approx 12 hours apart). Discussed taking with food approximately 12 hours apart. Discussed that capsule should not be crushed or split.  Adverse Effects: Nausea, vomiting, diarrhea (2 in 3 patients) appetite loss, weight loss - management of diarrhea with loperamide discussed including max use of 48 hours and max of 8 capsules per day. Abdominal pain (up to 1 in 5 patients) Nasopharyngitis (13%), UTI (6%) Risk of thrombosis (3%) and acute MI (2%) Hypertension (5%) Dizziness Fatigue (10%)  Monitoring: Monitor for diarrhea, nausea and vomiting, GI perforation, hepatotoxicity  Monitor LFTs - baseline, monthly for first 6 months, then every 3 months routinely CBC w differential at baseline and every 3 months routinely  Access: Approval of Ofev through: patient assistance Rx sent to: [employer] for Ofev: [phone]  Medication Reconciliation A drug regimen assessment was performed, including review of allergies, interactions, disease-state management, dosing and immunization history. Medications were reviewed with the patient, including name, instructions, indication, goals of therapy, potential side effects, importance of adherence, and safe use.  Anticoagulant use: No  Patient is to continue azathioprine '50mg'$  daily with Ofev '100mg'$  twice daily. She will plan to start Ofev this evening.  Thank you for involving pharmacy to assist in providing this patient's care.    Knox Saliva, PharmD, MPH, BCPS, CPP Clinical Pharmacist (Rheumatology and Pulmonology)

## 2022-03-12 NOTE — Progress Notes (Signed)
Office Visit Note  Patient: Lindsay Herman             Date of Birth: [birth year]           MRN: [id number]             PCP: Burnard Bunting, MD Referring: Burnard Bunting, MD Visit Date: 03/26/2022 Occupation: @GUAROCC$ @  Subjective:  Medication monitor   History of Present Illness: Lindsay Herman is a 77 y.o. female with history of limited systemic sclerosis, ILD and osteoarthritis.  She continues to be on Imuran 50 mg p.o. daily.  She denies any joint pain or joint swelling.  She denies any increased shortness of breath.  She has not had Raynauds symptoms this winter.  She denies any history of digital ulcers. I reviewed notes from January 24, 2022.  Patient was evaluated by Dr. Lynford Citizen who mentioned that pulmonary fibrosis is mildly worse with 15% of longest involved.  PFTs also showed mild worsening.  He recommended continue Imuran and adding off of 100 mg twice daily.  He also recommended taking patient off Fosamax as it may be contributing to worsening of pulmonary functions.  Patient was to follow-up in 3 months for repeat PFTs and DLCO.  She started Ofev 100 mg p.o. twice daily on March 10, 2022.    Activities of Daily Living:  Patient reports morning stiffness for 0 minutes.   Patient Denies nocturnal pain.  Difficulty dressing/grooming: Denies Difficulty climbing stairs: Denies Difficulty getting out of chair: Denies Difficulty using hands for taps, buttons, cutlery, and/or writing: Denies  Review of Systems  Constitutional:  Positive for fatigue.  HENT: Negative.  Negative for mouth sores and mouth dryness.   Eyes: Negative.  Negative for dryness.  Respiratory: Negative.  Negative for shortness of breath.   Cardiovascular: Negative.  Negative for chest pain and palpitations.  Gastrointestinal: Negative.  Negative for blood in stool, constipation and diarrhea.  Endocrine: Positive for increased urination.  Genitourinary: Negative.  Negative for involuntary  urination.  Musculoskeletal:  Positive for myalgias, muscle tenderness and myalgias. Negative for joint pain, gait problem, joint pain, joint swelling, muscle weakness and morning stiffness.  Skin: Negative.  Negative for color change, rash, hair loss and sensitivity to sunlight.  Allergic/Immunologic: Negative.  Negative for susceptible to infections.  Neurological: Negative.  Negative for dizziness and headaches.  Hematological: Negative.  Negative for swollen glands.  Psychiatric/Behavioral: Negative.  Negative for depressed mood and sleep disturbance. The patient is not nervous/anxious.     PMFS History:  Patient Active Problem List   Diagnosis Date Noted   Mild cognitive impairment 06/30/2021   Acute medial meniscal tear, left, initial encounter 01/14/2019   Chondromalacia, left knee 01/14/2019   Loose body of left knee 01/14/2019   Scleroderma, limited ([place]) 02/10/2016   History of environmental allergies 02/10/2016   Gastroesophageal reflux disease 02/10/2016   History of colon cancer 02/10/2016   Sicca syndrome 02/01/2016   History of repair of right rotator cuff 02/01/2016   Primary osteoarthritis of both knees 02/01/2016   Primary osteoarthritis of both hands 02/01/2016   Raynaud's disease without gangrene 02/01/2016   Cystocele    Atrophic vaginitis    Osteopenia    Menopausal symptoms     Past Medical History:  Diagnosis Date   Atherosclerosis    aorta and coronary artery   Atrophic vaginitis    Cancer ([place]) 1995   Colon cancer tx surgery and chemo   Contact lens/glasses fitting  wears contacts or glasses   Cystocele    Nasal fracture    Osteopenia 06/2017   T score -2.0 FRAX 13% / 2.8%   PONV (postoperative nausea and vomiting)    Raynaud's disease    Rectocele    Rosacea    Scleroderma ([place])    Urinary frequency     Family History  Problem Relation Age of Onset   Diabetes Mother    Heart disease Mother    Stroke Mother    Diabetes Brother     Hypertension Brother    Lupus Father    Past Surgical History:  Procedure Laterality Date   APPENDECTOMY     colon cancer  1995   COLON RESECTION  1995   KNEE ARTHROSCOPY Left 01/14/2019   Procedure: ARTHROSCOPY KNEE, PARTIAL MENISCECTOMY, CHONDROPLASTY AND LOOSE BODY REMOVAL;  Surgeon: Dorna Leitz, MD;  Location: [facility];  Service: Orthopedics;  Laterality: Left;   MASS EXCISION Left 08/31/2012   Procedure: EXCISION MUCOID CYST DEBRIDEMENT DISTAL INTERPHALANGEAL LEFT MIDDLE FINGER;  Surgeon: Wynonia Sours, MD;  Location: [place] [facility];  Service: Orthopedics;  Laterality: Left;   OOPHORECTOMY     LSO   ROTATOR CUFF REPAIR Right    TONSILLECTOMY     VAGINAL HYSTERECTOMY  1994   Social History   Social History Narrative   Right handed   Drinks caffeine   Two story home   Immunization History  Administered Date(s) Administered   Influenza Split 12/12/2010   Influenza, High Dose Seasonal PF 10/24/2017, 11/08/2018   PFIZER(Purple Top)SARS-COV-2 Vaccination 03/18/2019, 04/12/2019, 09/29/2019, 03/27/2020   Pneumococcal Conjugate-13 07/24/2014   Pneumococcal Polysaccharide-23 01/31/2009   Zoster Recombinat (Shingrix) 11/27/2017, 02/12/2018     Objective: Vital Signs: BP 133/74 (BP Location: Left Arm, Patient Position: Sitting, Cuff Size: Normal)   Pulse 77   Ht 5' 7"$  (1.702 m)   Wt 162 lb (73.5 kg)   BMI 25.37 kg/m    Physical Exam Vitals and nursing note reviewed.  Constitutional:      Appearance: She is well-developed.  HENT:     Head: Normocephalic and atraumatic.  Eyes:     Conjunctiva/sclera: Conjunctivae normal.  Cardiovascular:     Rate and Rhythm: Normal rate and regular rhythm.     Heart sounds: Normal heart sounds.  Pulmonary:     Effort: Pulmonary effort is normal.     Breath sounds: Normal breath sounds.     Comments: Crackles in the lung bases Abdominal:     General: Bowel sounds are normal.     Palpations: Abdomen is soft.   Musculoskeletal:     Cervical back: Normal range of motion.  Lymphadenopathy:     Cervical: No cervical adenopathy.  Skin:    General: Skin is warm and dry.     Capillary Refill: Capillary refill takes less than 2 seconds.     Comments: Sclerodactyly noted distal to MCPs.  Tapering of fingers with good capillary refill was noted.  Neurological:     Mental Status: She is alert and oriented to person, place, and time.  Psychiatric:        Behavior: Behavior normal.      Musculoskeletal Exam: She had limitation with range of motion of the cervical spine.  There was no tenderness over thoracic or lumbar spine.  Shoulders, elbows, wrist, MCPs were in good range of motion.  She had bilateral PIP and DIP thickening consistent with osteoarthritis.  Hip joints and knee joints were  in good range of motion.  There was no tenderness over ankles or MTPs.  CDAI Exam: CDAI Score: -- Patient Global: --; Provider Global: -- Swollen: --; Tender: -- Joint Exam 03/26/2022   No joint exam has been documented for this visit   There is currently no information documented on the homunculus. Go to the Rheumatology activity and complete the homunculus joint exam.  Investigation: No additional findings.  Imaging: No results found.  Recent Labs: Lab Results  Component Value Date   WBC 7.0 12/12/2021   HGB 13.8 12/12/2021   PLT 271 12/12/2021   NA 142 12/12/2021   K 5.1 12/12/2021   CL 107 12/12/2021   CO2 29 12/12/2021   GLUCOSE 97 12/12/2021   BUN 12 12/12/2021   CREATININE 0.90 12/12/2021   BILITOT 0.3 12/12/2021   ALKPHOS 64 10/19/2018   AST 17 12/12/2021   ALT 12 12/12/2021   PROT 6.6 12/12/2021   ALBUMIN 4.3 10/19/2018   CALCIUM 9.5 12/12/2021   GFRAA 97 06/14/2020   QFTBGOLDPLUS NEGATIVE 10/06/2017     Speciality Comments: Osteoporosis managed by Dr. Phineas Real.  Procedures:  No procedures performed Allergies: Codeine, Morphine, and Morphine and related   Assessment / Plan:      Visit Diagnoses: Scleroderma, limited ([place]) - Hx sclerodactyly, Raynauds, arthralgias, ILD, +ANA, +Ro, +La, -RF.  Sclerodactyly was noted.  She denies any increased shortness of breath.  She states her Raynaud's has been stable without any flares.  She was evaluated by Dr. Chase Caller recently and was started on Ofev.  She has been taking Ofev on a regular basis without any side effects.  Chronic inflammatory arthritis - RF-, CCP-, 14-3-3 eta negative, R wrist synovitis, erosive changes, intercarpal and radiocarpal joint space narrowing.  She had no synovitis on the examination.  She had been doing well on Imuran 50 mg p.o. daily..  Interstitial lung disease ([place]) - followed by Dr. Chase Caller and Dorris Carnes.  Patient saw Dr. Chase Caller in November 2023.  She had some deterioration of the fibrosis and PFTs.  Ofev was added 100 mg p.o. twice daily on March 10, 2022.  She has been tolerating medication well.  She will follow-up with Dr. Chase Caller soon.  Pain in right foot - patient states she tripped on the carpet in June 2023.  Pain resolved now.  High risk medication use - Imuran 50 mg po daily. Off PLQ due to Macular Degenration Concern.  Ofev 100 mg p.o. twice daily started on March 10, 2022.-CBC and CMP were normal on December 12, 2021.  Will obtain labs today.  Plan: CBC with Differential/Platelet, COMPLETE METABOLIC PANEL WITH GFR  Raynaud's disease without gangrene-currently not active.  No digital ulcers were noted.  TPMT intermediate metabolizer (HCC)-low metabolite  History of repair of right rotator cuff-she had good Ro  Primary osteoarthritis of both hands-she had bilateral IP thickening.  Joint protection muscle strengthening was discussed.  Primary osteoarthritis of both knees-no warmth swelling or effusion was noted.  She had good range of motion of bilateral knee joints.  S/P arthroscopy of right knee  History of colon cancer  Osteopenia of multiple sites - DEXA scan on  March 28, 2019 showed a T score of -2.0, BMD 0.628 in the left femoral neck.  She had been on Fosamax for the last few years by her GYN but she discontinued Fosamax about a year ago per patient.  She is uncertain when exactly she started Fosamax and when she discontinued.  Jun 14, 2021  DEXA scan T-score -2.3, BMD 0.593 left femoral neck no comparison.  Right total femur decreased 0.1%-I reviewed the DEXA results with the patient.  She is a still in the osteopenia range.  States uncertainty for the duration she has been on bisphosphonates we will give her a drug holiday for the next 2 years.  We discussed most likely starting her on IV Reclast after her next DEXA scan.  Use of calcium rich diet and vitamin D supplement was advised.  Plan: VITAMIN D 25 Hydroxy (Vit-D Deficiency, Fractures)  Vitamin D deficiency -she has history of vitamin D deficiency.  Will check vitamin D level today.  Plan: VITAMIN D 25 Hydroxy (Vit-D Deficiency, Fractures)  Orders: Orders Placed This Encounter  Procedures   CBC with Differential/Platelet   COMPLETE METABOLIC PANEL WITH GFR   VITAMIN D 25 Hydroxy (Vit-D Deficiency, Fractures)   No orders of the defined types were placed in this encounter.    Follow-Up Instructions: Return in about 5 months (around 08/24/2022) for Scleroderma, Osteoarthritis.   Bo Merino, MD  Note - This record has been created using Editor, commissioning.  Chart creation errors have been sought, but may not always  have been located. Such creation errors do not reflect on  the standard of medical care.

## 2022-03-26 ENCOUNTER — Encounter: Payer: Self-pay | Admitting: Rheumatology

## 2022-03-26 ENCOUNTER — Ambulatory Visit: Payer: Medicare PPO | Attending: Rheumatology | Admitting: Rheumatology

## 2022-03-26 VITALS — BP 133/74 | HR 77 | Ht 67.0 in | Wt 162.0 lb

## 2022-03-26 DIAGNOSIS — M19042 Primary osteoarthritis, left hand: Secondary | ICD-10-CM

## 2022-03-26 DIAGNOSIS — Z79899 Other long term (current) drug therapy: Secondary | ICD-10-CM | POA: Diagnosis not present

## 2022-03-26 DIAGNOSIS — M349 Systemic sclerosis, unspecified: Secondary | ICD-10-CM

## 2022-03-26 DIAGNOSIS — Z85038 Personal history of other malignant neoplasm of large intestine: Secondary | ICD-10-CM

## 2022-03-26 DIAGNOSIS — M8589 Other specified disorders of bone density and structure, multiple sites: Secondary | ICD-10-CM

## 2022-03-26 DIAGNOSIS — E8889 Other specified metabolic disorders: Secondary | ICD-10-CM

## 2022-03-26 DIAGNOSIS — M19041 Primary osteoarthritis, right hand: Secondary | ICD-10-CM | POA: Diagnosis not present

## 2022-03-26 DIAGNOSIS — J849 Interstitial pulmonary disease, unspecified: Secondary | ICD-10-CM | POA: Diagnosis not present

## 2022-03-26 DIAGNOSIS — M1990 Unspecified osteoarthritis, unspecified site: Secondary | ICD-10-CM | POA: Diagnosis not present

## 2022-03-26 DIAGNOSIS — E559 Vitamin D deficiency, unspecified: Secondary | ICD-10-CM | POA: Diagnosis not present

## 2022-03-26 DIAGNOSIS — M79671 Pain in right foot: Secondary | ICD-10-CM

## 2022-03-26 DIAGNOSIS — Z9889 Other specified postprocedural states: Secondary | ICD-10-CM

## 2022-03-26 DIAGNOSIS — M170 Bilateral primary osteoarthritis of knee: Secondary | ICD-10-CM | POA: Diagnosis not present

## 2022-03-26 DIAGNOSIS — I7300 Raynaud's syndrome without gangrene: Secondary | ICD-10-CM | POA: Diagnosis not present

## 2022-03-26 NOTE — Patient Instructions (Signed)
Standing Labs We placed an order today for your standing lab work.   Please have your standing labs drawn in May and every 3 months  Please have your labs drawn 2 weeks prior to your appointment so that the provider can discuss your lab results at your appointment.  Please note that you may see your imaging and lab results in [facility] before we have reviewed them. We will contact you once all results are reviewed. Please allow our office up to 72 hours to thoroughly review all of the results before contacting the office for clarification of your results.  Lab hours are:   Monday through Thursday from 8:00 am -12:30 pm and 1:00 pm-5:00 pm and Friday from 8:00 am-12:00 pm.  Please be advised, all patients with office appointments requiring lab work will take precedent over walk-in lab work.   Labs are drawn by [facility]. Please bring your co-pay at the time of your lab draw.  You may receive a bill from [facility] for your lab work.  Please note if you are on Hydroxychloroquine and and an order has been placed for a Hydroxychloroquine level, you will need to have it drawn 4 hours or more after your last dose.  If you wish to have your labs drawn at another location, please call the office 24 hours in advance so we can fax the orders.  The office is located at [address], [place], [place], [place] [address] No appointment is necessary.    If you have any questions regarding directions or hours of operation,  please call [phone].   As a reminder, please drink plenty of water prior to coming for your lab work. Thanks!   Vaccines You are taking a medication(s) that can suppress your immune system.  The following immunizations are recommended: Flu annually Covid-19 RSV  Td/Tdap (tetanus, diphtheria, pertussis) every 10 years Pneumonia (Prevnar 15 then Pneumovax 23 at least 1 year apart.  Alternatively, can take Prevnar 20 without needing additional dose) Shingrix: 2 doses from 4 weeks  to 6 months apart  Please check with your PCP to make sure you are up to date.

## 2022-03-27 LAB — COMPLETE METABOLIC PANEL WITH GFR
AG Ratio: 1.8 (calc) (ref 1.0–2.5)
ALT: 10 U/L (ref 6–29)
AST: 15 U/L (ref 10–35)
Albumin: 4.2 g/dL (ref 3.6–5.1)
Alkaline phosphatase (APISO): 52 U/L (ref 37–153)
BUN: 11 mg/dL (ref 7–25)
CO2: 29 mmol/L (ref 20–32)
Calcium: 9.7 mg/dL (ref 8.6–10.4)
Chloride: 107 mmol/L (ref 98–110)
Creat: 0.79 mg/dL (ref 0.60–1.00)
Globulin: 2.3 g/dL (calc) (ref 1.9–3.7)
Glucose, Bld: 103 mg/dL — ABNORMAL HIGH (ref 65–99)
Potassium: 4.8 mmol/L (ref 3.5–5.3)
Sodium: 142 mmol/L (ref 135–146)
Total Bilirubin: 0.3 mg/dL (ref 0.2–1.2)
Total Protein: 6.5 g/dL (ref 6.1–8.1)
eGFR: 77 mL/min/{1.73_m2} (ref >=60)

## 2022-03-27 LAB — CBC WITH DIFFERENTIAL/PLATELET
Absolute Monocytes: 787 cells/uL (ref 200–950)
Basophils Absolute: 31 cells/uL (ref 0–200)
Basophils Relative: 0.5 %
Eosinophils Absolute: 130 cells/uL (ref 15–500)
Eosinophils Relative: 2.1 %
HCT: 41.5 % (ref 35.0–45.0)
Hemoglobin: 13.6 g/dL (ref 11.7–15.5)
Lymphs Abs: 812 cells/uL — ABNORMAL LOW (ref 850–3900)
MCH: 31 pg (ref 27.0–33.0)
MCHC: 32.8 g/dL (ref 32.0–36.0)
MCV: 94.5 fL (ref 80.0–100.0)
MPV: 11 fL (ref 7.5–12.5)
Monocytes Relative: 12.7 %
Neutro Abs: 4439 cells/uL (ref 1500–7800)
Neutrophils Relative %: 71.6 %
Platelets: 310 10*3/uL (ref 140–400)
RBC: 4.39 10*6/uL (ref 3.80–5.10)
RDW: 12.4 % (ref 11.0–15.0)
Total Lymphocyte: 13.1 %
WBC: 6.2 10*3/uL (ref 3.8–10.8)

## 2022-03-27 LAB — VITAMIN D 25 HYDROXY (VIT D DEFICIENCY, FRACTURES): Vit D, 25-Hydroxy: 28 ng/mL — ABNORMAL LOW (ref 30–100)

## 2022-03-27 NOTE — Progress Notes (Signed)
Lymphocyte count is low and stable.  CMP is normal.  Vitamin D is still low.  Patient should take vitamin D 2000 units daily.

## 2022-03-31 ENCOUNTER — Encounter: Payer: Medicare PPO | Admitting: Psychology

## 2022-04-07 ENCOUNTER — Encounter: Payer: Medicare PPO | Admitting: Psychology

## 2022-04-09 ENCOUNTER — Ambulatory Visit: Payer: Medicare PPO | Attending: Internal Medicine

## 2022-04-09 DIAGNOSIS — E782 Mixed hyperlipidemia: Secondary | ICD-10-CM

## 2022-04-09 DIAGNOSIS — Z79899 Other long term (current) drug therapy: Secondary | ICD-10-CM

## 2022-04-09 DIAGNOSIS — I2510 Atherosclerotic heart disease of native coronary artery without angina pectoris: Secondary | ICD-10-CM

## 2022-04-11 LAB — HEPATIC FUNCTION PANEL
ALT: 11 IU/L (ref 0–32)
AST: 16 IU/L (ref 0–40)
Albumin: 4.2 g/dL (ref 3.8–4.8)
Alkaline Phosphatase: 57 IU/L (ref 44–121)
Bilirubin Total: 0.5 mg/dL (ref 0.0–1.2)
Bilirubin, Direct: 0.14 mg/dL (ref 0.00–0.40)
Total Protein: 6.4 g/dL (ref 6.0–8.5)

## 2022-04-11 LAB — NMR, LIPOPROFILE
Cholesterol, Total: 157 mg/dL (ref 100–199)
HDL Particle Number: 47 umol/L (ref >=30.5)
HDL-C: 68 mg/dL (ref >39)
LDL Particle Number: 1119 nmol/L — ABNORMAL HIGH (ref <1000)
LDL Size: 20.6 nm (ref >20.5)
LDL-C (NIH Calc): 72 mg/dL (ref 0–99)
LP-IR Score: 45 (ref <=45)
Small LDL Particle Number: 638 nmol/L — ABNORMAL HIGH (ref <=527)
Triglycerides: 92 mg/dL (ref 0–149)

## 2022-04-11 LAB — LIPOPROTEIN A (LPA): Lipoprotein (a): 216.6 nmol/L — ABNORMAL HIGH (ref <75.0)

## 2022-04-11 LAB — APOLIPOPROTEIN B: Apolipoprotein B: 83 mg/dL (ref <90)

## 2022-04-15 ENCOUNTER — Telehealth: Payer: Self-pay | Admitting: Internal Medicine

## 2022-04-15 NOTE — Telephone Encounter (Signed)
Pt is returning call for lab results. Pt has an appt and wont be back home till after 1:30 PM

## 2022-04-15 NOTE — Telephone Encounter (Signed)
The patient has been notified of the result and verbalized understanding.  All questions (if any) were answered. Gershon Crane, LPN [phone] [id number] PM

## 2022-04-16 DIAGNOSIS — Z636 Dependent relative needing care at home: Secondary | ICD-10-CM | POA: Diagnosis not present

## 2022-04-16 DIAGNOSIS — I10 Essential (primary) hypertension: Secondary | ICD-10-CM | POA: Diagnosis not present

## 2022-04-16 DIAGNOSIS — I700 Atherosclerosis of aorta: Secondary | ICD-10-CM | POA: Diagnosis not present

## 2022-04-24 ENCOUNTER — Encounter: Payer: Self-pay | Admitting: Internal Medicine

## 2022-04-24 ENCOUNTER — Ambulatory Visit (INDEPENDENT_AMBULATORY_CARE_PROVIDER_SITE_OTHER): Payer: Medicare PPO | Admitting: Internal Medicine

## 2022-04-24 ENCOUNTER — Ambulatory Visit: Payer: Medicare PPO | Admitting: Internal Medicine

## 2022-04-24 ENCOUNTER — Telehealth: Payer: Self-pay | Admitting: Internal Medicine

## 2022-04-24 VITALS — BP 106/68 | HR 81 | Temp 97.9°F | Ht 67.0 in | Wt 164.4 lb

## 2022-04-24 DIAGNOSIS — Z5181 Encounter for therapeutic drug level monitoring: Secondary | ICD-10-CM | POA: Diagnosis not present

## 2022-04-24 DIAGNOSIS — M359 Systemic involvement of connective tissue, unspecified: Secondary | ICD-10-CM

## 2022-04-24 DIAGNOSIS — M349 Systemic sclerosis, unspecified: Secondary | ICD-10-CM | POA: Diagnosis not present

## 2022-04-24 DIAGNOSIS — J8489 Other specified interstitial pulmonary diseases: Secondary | ICD-10-CM

## 2022-04-24 LAB — PULMONARY FUNCTION TEST
DL/VA % pred: 109 %
DL/VA: 4.39 ml/min/mmHg/L
DLCO cor % pred: 107 %
DLCO cor: 22.5 ml/min/mmHg
DLCO unc % pred: 107 %
DLCO unc: 22.63 ml/min/mmHg
FEF 25-75 Pre: 2.82 L/sec
FEF2575-%Pred-Pre: 162 %
FEV1-%Pred-Pre: 114 %
FEV1-Pre: 2.66 L
FEV1FVC-%Pred-Pre: 115 %
FEV6-%Pred-Pre: 104 %
FEV6-Pre: 3.08 L
FEV6FVC-%Pred-Pre: 105 %
FVC-%Pred-Pre: 99 %
FVC-Pre: 3.08 L
Pre FEV1/FVC ratio: 86 %
Pre FEV6/FVC Ratio: 100 %

## 2022-04-24 NOTE — Patient Instructions (Signed)
Spiro/DLCO performed today.  

## 2022-04-24 NOTE — Progress Notes (Signed)
Spiro/DLCO performed today.  

## 2022-04-24 NOTE — Telephone Encounter (Signed)
Rx team  RALEY KALLER -started on nintedanib on 03/10/2022.  At that time I saw a note that she has 59-monthsupply at home.  However today she showed me a nearly empty bottle and says she does not have any more medicine at home.  Please note that she also has mild memory loss.  Plan - Please do double check with her about her supply at home.

## 2022-04-24 NOTE — Addendum Note (Signed)
Addended byOralia Rud M on: 04/24/2022 03:56 PM   Modules accepted: Orders

## 2022-04-24 NOTE — Patient Instructions (Addendum)
ICD-10-CM   1. Interstitial lung disease due to connective tissue disease ([place])  J84.89    M35.9     2. Scleroderma ([place])  M34.9     3. Encounter for therapeutic drug monitoring  Z51.81          Interstitial lung disease secondary to scleroderma.- mildly and slwly progressive over time On Ofev since Mar 10, 2022 LFT normal 04/09/22 Glad you stopped  fosfamax because of acid reflux risk    Plan -Continue Imuran that was prescribed by Dr. Keturah Barre for your joint pain - Cotninue ofev low dose protocol at '100mg'$  twice daily    - sent note to our pharmacy to ensure you have adequate supply - Check LFT 04/24/2022 and  again in 1 month and 2 months    - need standing orders   Followup -3 months - 30 min visit with Dr Chase Caller   - walk and ILD symptoms score at followup

## 2022-04-24 NOTE — Progress Notes (Addendum)
OV 11/10/2017  Background history followed by Dr. Estanislado Pandy - Scleroderma, limited ([place]) - Hx sclerodactyly, Raynauds, arthralgias, +ANA, +Ro, +La, +RF ; Off PLQ due to Macular Degenration Concern.  - Raynaud's disease without gangrene-it is not very symptomatic currently.  - Sicca syndrome-she continues to have dry mouth and dry eyes.  The symptoms are tolerable.  - Primary osteoarthritis of both hands-she has some changes consistent with osteoarthritis. - Antibiodies - 10/06/17   - neg CCP and RA   Current history -77 year old lady with the above medical problems.  Known to have scleroderma for at least 10 years according to history.  She tells me that she has had insidious onset of shortness of breath for heavy exertion after doing long walks for a few to several years.  It is stable and has not changed.  She thought this was because of aging.  She does not have scleroderma.  She had pulmonary function test in the past but she does not know the result.  Looking at it it shows reduction in diffusion capacity.  Most recently based on the history and review of rheumatology charts it appears that she started having arthritis in her forearm.  This was in August 2019.  Rheumatoid arthritis work-up was negative on serology.  However because the arthritis was felt to be inflammatory methotrexate was considered.  Because of her previous abnormal pulmonary function test high-resolution CT chest was obtained and it showed probable UIP pattern ILD.  I personally visualized the CT chest and agree with those findings.  Repeat pulmonary function test showed stability.  Therefore she has been referred here.  In the phone conversation with Dr. Keturah Barre I was okay with patient starting Imuran against both arthritis and ILD.  At this point that she is feeling stable.  We asked her to do interstitial lung disease questionnaire.  We sent this to her to her house but she forgot to bring it.  Walking desaturation test was  normal without any desaturations.    [place] [facility] Comprehensive ILD Questionnaire  -Symptoms: She has level 3 dyspnea for walking up stairs or walking up a hill but otherwise does not have dyspnea.  She does not cough does not have a  Past medical history: She is occult positive for rheumatoid arthritis for a few weeks.  She she circled positive for scleroderma limited since 2010 but otherwise negative  Review of systems: Positive for fatigue at times for few years.  Arthralgia for several years.  She has had Raynud for several years.  She says after she retired and not having to rush to school and not being exposed to the outdoor air in the winter it is actually much better and not noticeable.  She does have some snoring but it is controlled with Afrin nasal spray.  Personal exposure history: Started smoking 1968 quit in 1974.  Smoked 8 cigarettes a day.  She has lived with a smoker but denies any use of cigars or marijuana cocaine intravenous drug use of vaping products.  Home exposure history: Single-family home suburban 33 years in the same home age of the home of 23 years  Occupational history: 122 questionnaires positive first living in a condition spaces but otherwise negative.  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 ,  MRN: [id number] , ADDRESS: [place] [address]   10/27/2018 -   Chief Complaint  Patient presents with   ILD (interstitial lung disease)    Discuss results of PFT     ICD-10-CM   1. ILD (interstitial lung disease) ([place])  J84.9   2. Scleroderma ([place])  M34.9      HPI Lindsay Herman 77 y.o. -presents for follow-up of interstitial lung disease secondary to scleroderma.  Since her last visit approximately a year ago she continues to be stable.  Symptom  scores are mild.  SHe is on Imuran through Dr Bo Merino.  She reports no new complaints.  She is social distancing quite well.  She had questions about monitoring of her disease and future therapy.  She had pulmonary function test today that shows continued stability.  Her current symptom scores are mild.    OV 09/08/2019   Subjective:  Patient ID: Lindsay Herman, female , DOB: [birth year], age 77 y.o. years. , MRN: [id number],  ADDRESS: [place] [address] PCP  Burnard Bunting, MD Providers : Treatment Team:  Attending Provider: Brand Males, MD   Chief Complaint  Patient presents with   Follow-up    Pt states she has been doing good since last visit and states she feels like she has been stable. Pt denies any complaints with her breathing.       HPI Lindsay Herman 77 y.o. -follow-up for interstitial lung disease secondary to scleroderma on Imuran.  She has mild stable interstitial lung disease.  Last seen in September 2020.  Since then she continues to be stable.  Symptom scores are listed below and extremely mild.  Infectious somewhat better than before.  She continues on Imuran.  Because of stability we discussed and she would not take antifibrotic's.  Also she has a colectomy.  She tells me that [name] who is a patient of mine now disease from scleroderma ILD was a neighboring) for 88 years.  She is thankful for my care to her.  She does not have any chest pain.  She had a high-resolution CT chest and this documents continued stability of her probably UIP pattern of ILD.  She has scheduled PFT test today but given stability of her CT and symptoms wondering if we can skip it - per HPI  IMPRESSION: 1. There is mild, bibasilar predominant pulmonary fibrosis in a pattern featuring mild tubular bronchiectasis, irregular peripheral interstitial and ground-glass opacity and subtle areas of bronchiolectasis in the lung bases. Fibrotic findings are  not significantly changed compared to prior examination dated 10/19/2017. Findings remain most consistent with a "probable UIP" pattern of fibrosis by ATS pulmonary fibrosis criteria and are potentially consistent with a connective tissue disorder related fibrosis given reported history of scleroderma. Findings are categorized as probable UIP per consensus guidelines: Diagnosis of Idiopathic Pulmonary Fibrosis: An Official ATS/ERS/JRS/ALAT Clinical Practice Guideline. [place], Iss 5, [phone], Oct 11 2016. 2. No CT abnormality of the esophagus given reported history of scleroderma. 3. Cholelithiasis.     Electronically Signed   By: Eddie Candle M.D.   On: 09/01/2019 13:56   ROS - per HPI  OV 09/20/2020  Subjective:  Patient ID: Lindsay Herman, female , DOB: 05-Aug-1945 , age 31 y.o. , MRN: [id number] , ADDRESS: [place] [address] PCP Burnard Bunting, MD Patient Care Team: Burnard Bunting, MD as PCP - General (Internal Medicine) Harrington Challenger,  Carmin Muskrat, MD as PCP - Cardiology (Cardiology)  This Provider for this visit: Treatment Team:  Attending Provider: Brand Males, MD    09/20/2020 -   Chief Complaint  Patient presents with   Follow-up    PFT performed today.  Pt states she has been doing okay since last visit. States she does have some hoarseness first thing in the morning.    interstitial lung disease secondary to scleroderma on Imuran Hx of Colectomy  HPI Lindsay Herman 77 y.o. -returns for her annual follow-up.  She says in the last 1 year respiratory status is stable.  ILD symptom score is 3 in the past it is fluctuated between 1 and 6 so she is in the middle.  There is no cough or any other issue.  However she does have mild hoarseness of voice for the last 1 year and it did get worse.  It is then on and off.  She has never seen ENT.  She is a current non-smoker but a former smoker   She continues on Imuran.  She is  worried about the fact she is immunosuppressed.  She social distances and masks.  Her pulmonary function test shows stability DLCO but her FVC is lower.  She thinks it was a technique issue.  Her walking desaturation test and symptom score is stable.      IMPRESSION: HRCT 1. There is mild, bibasilar predominant pulmonary fibrosis in a pattern featuring mild tubular bronchiectasis, irregular peripheral interstitial and ground-glass opacity and subtle areas of bronchiolectasis in the lung bases. Fibrotic findings are not significantly changed compared to prior examination dated 10/19/2017. Findings remain most consistent with a "probable UIP" pattern of fibrosis by ATS pulmonary fibrosis criteria and are potentially consistent with a connective tissue disorder related fibrosis given reported history of scleroderma. Findings are categorized as probable UIP per consensus guidelines: Diagnosis of Idiopathic Pulmonary Fibrosis: An Official ATS/ERS/JRS/ALAT Clinical Practice Guideline. [place], Iss 5, [phone], Oct 11 2016. 2. No CT abnormality of the esophagus given reported history of scleroderma. 3. Cholelithiasis.     Electronically Signed   By: Eddie Candle M.D.   On: 09/01/2019 13:56    OV 08/29/2021  Subjective:  Patient ID: Lindsay Herman, female , DOB: May 12, 1945 , age 64 y.o. , MRN: [id number] , ADDRESS: [place] [id number] PCP Burnard Bunting, MD Patient Care Team: Burnard Bunting, MD as PCP - General (Internal Medicine) Fay Records, MD as PCP - Cardiology (Cardiology)  This Provider for this visit: Treatment Team:  Attending Provider: Brand Males, MD    08/29/2021 -   Chief Complaint  Patient presents with   Follow-up    Patient has no complaints.      HPI Lindsay Herman 77 y.o. -returns for follow-up.  Last seen in August 2022.  Since then she did see Dr Bo Merino on Jun 19, 2021.  Notes from  rheumatology back then noted.  She did say that rheumatology did notice crackles in her lungs.  Patient's feels over the last 1 year she is more short of breath with exertion relieved by rest.  There is no associated chest pain.  There is no cough or wheezing.  No syncope.  She believes this because for the last 3 years because of the pandemic she has not been exercising and every time she tries to go to the [facility] this scared of another respiratory viral circulation in the community.  Therefore she is more sedentary.  She also during the winters used to walk around [employer] but she is not doing that anymore.  She continues to Imuran.  Review of the records indicate last CT scan of the chest is in 2021.  Last echocardiogram 2018.  Social history: Husband had MI 3 years ago and is more sedentary ECOG is 3-4.  He watching TV all the time and eating all the time.  She is frustrated little bit by this.        OV 10/31/2021  Subjective:  Patient ID: Lindsay Herman, female , DOB: [birth year] , age 43 y.o. , MRN: [id number] , ADDRESS: [place] [id number] PCP Burnard Bunting, MD Patient Care Team: Burnard Bunting, MD as PCP - General (Internal Medicine) Fay Records, MD as PCP - Cardiology (Cardiology) Skeet Latch, MD as Attending Physician (Cardiology)  This Provider for this visit: Treatment Team:  Attending Provider: Brand Males, MD    10/31/2021 -   Chief Complaint  Patient presents with   Follow-up    Pt is here today to discuss results of recent CT. Pt states she has been doing okay since last visit and denies any complaints.    HPI Lindsay Herman 77 y.o. -returns for follow-up.  At last visit there was concern ILD was getting worse because of symptom score was worse.  Today she tells me she saw it improved spontaneously.  Symptom score still shows slight improvement nevertheless compared to a year ago it slightly worse.  Her echocardiogram shows  grade 1 diastolic dysfunction.  She actually told me she did not understand fully what scleroderma meant.  She did not understand her thickening skin and the wrinkles on her face are because of scleroderma.  She says she has been on Imuran for a few years.  She is has history of colectomy but there is no diarrhea.  Our radiologist thought the ILD is slightly worse.  I shared this result with her.  She wanted know about therapeutic options.  We discussed nintedanib but did express concern that given her colectomy she would have diarrhea but at baseline she says she never had any diarrhea.  I personally visualized the scan and compared to 2021 and also 2019.  I personally do not think her ILD is worse now compared to the past.  Her echo has grade 1 diastolic dysfunction.  I noticed that she is on Fosamax which can provoke acid reflux particular in scleroderma and then this in turn can make ILD worse.  I think she should stop this.  Overall we recommended close monitoring because of the side effect profile of approved antifibrotic nintedanib.  The other option would be to do Actemra or CellCept.  CellCept also can cause diarrhea.  Actemra is given subcutaneous and would be a viable option for her but I would need to know if she has at least 10% of lung involvement fibrosis.  I put a message out to the radiologist.  She is already on 1 immunomodulators and so adding another immunomodulator could increase the risk of opportunistic infections.  Therefore overall taking a more close monitoring approach.      OV 01/24/2022  Subjective:  Patient ID: Lindsay Herman, female , DOB: 01-09-1946 , age 50 y.o. , MRN: [id number] , ADDRESS: [place] [id number] PCP Burnard Bunting, MD Patient Care Team: Burnard Bunting, MD as PCP - General (Internal Medicine) Fay Records, MD as PCP -  Cardiology (Cardiology) Skeet Latch, MD as Attending Physician (Cardiology)  This Provider for this  visit: Treatment Team:  Attending Provider: Brand Males, MD    01/24/2022 -   Chief Complaint  Patient presents with   Follow-up    PFT performed today.  Pt states she has been doing okay since last visit and denies any complaints.   HPI Lindsay Herman 77 y.o. -returns for follow-up.  At this visit she actually reports stable symptoms.  Her dyspnea score is actually very low.  I did indicate to her that in the past she had given a high score.  She said that she quite could not understand the questions.  Last visit I realized that she did not have a good grasp of interstitial lung disease and scleroderma and I explained it.  This visit I asked her about scleroderma.  She says that all she notices that she has scleroderma and Dr. Wille Glaser sees her for this.  She is not fully sure what the implications of scleroderma are.  She is not aware that she has interstitial lung disease although she does realize that she is seeing me for "a lung problem".  Review of the records indicate that she is having short-term memory loss.  She has seen neurology in the spring 2023 and noticed to have mild cognitive impairment.  She was able to recollect details of colon cancer from 1996.  Given the scleroderma last visit I told her to stop her Fosamax.  At that point in time she told me that she was still taking her Fosamax.  I then pointed out to the med list that did not show Fosamax.  Then she did not know what Fosamax was for.  Then she said that she absolutely was not taking Fosamax anymore.  I did inquire about her social status.  She lives with her husband.  Husband's had MI in the past.  She states that he spends his night sleeping in the recliner in the TV room.  She has 2 sons one of them lives in [place].  Another 1 lives in [place].  I did ask if she would be interested in bringing them for a future visit.  She did not feel it was necessary.  She wanted to know the benefit of bringing her children to  her medical visit.  I did indicate to her that it is generally a good idea to have children involved so the full knowledge of the parents health status but again it would be her decision and her right to disclose.  She says she will update her children about her medical conditions.  Reviewed pulmonary function test the DLCO is definitely declining this year.  CT scan of the chest radiologist also felt there was a slight decline.  Therefore I did indicate to her that there is evidence for progression in the interstitial lung disease.  In addition with scleroderma according to Dr. Weber Cooks addendum report there is at least 15% of her lungs involved with fibrosis.  Therefore I told her that she meets criteria to start a second medication which is antifibrotic nintedanib.  She does not have any GI issues other than colectomy.  I did contact the GI doctor through secure chat.  Dr. Therisa Doyne did not see any contraindication for nintedanib from a GI standpoint.  I told patient we will start a low-dose protocol.  She is normal liver function and renal function recently.  Some minutes after this I had the research coordinator  give her a consent form for ILD-Pro registry.  By this time she was not fully aware about her interstitial lung disease even though I just discussed this.  So I went back.  She told me she was able to recollect details of interstitial lung disease and our conversation and the decision to start nintedanib and she was fine with it.  She does keep her appointments on time and does follow the instructions.     OV 04/24/2022  Subjective:  Patient ID: Lindsay Herman, female , DOB: 07-Feb-1946 , age 44 y.o. , MRN: [id number] , ADDRESS: [place] [id number] PCP Burnard Bunting, MD Patient Care Team: Burnard Bunting, MD as PCP - General (Internal Medicine) Fay Records, MD as PCP - Cardiology (Cardiology) Skeet Latch, MD as Attending Physician (Cardiology)  This  Provider for this visit: Treatment Team:  Attending Provider: Brand Males, MD    interstitial lung disease secondary to scleroderma on Imuran  -Last high-resolution CT chest July 2021 described as mild probable UIP pattern. -> Aug 2023 with minimal progession  - 15-20% of involvement of fibrosis latest CT Aug 2023  -Started low-dose nintedanib protocol December 2023 Hx of Colectomy due to colon cancer  -Per Dr. Therisa Doyne December 2023: colonoscopy was in 01/01/22 , a large polyp was removed, she has history of colectomy in 1996 ( sigmoid).  History of acid reflux disease History of osteopenia on Fosamax   -Advised to talk to the primary care physician and stop it in September [id number] Grade 1 diastolic dysfunction on echo summer 2023 Mild cognitive impairment  -Sees neurology: Spring 2023 [facility] cognitive score 20  04/24/2022 -   Chief Complaint  Patient presents with   Follow-up    Pft review,      HPI Lindsay Herman 77 y.o. -returns for follow-up.  I saw her in December 2023 and recommended nintedanib.  She started nintedanib on 03/10/2022.  I reviewed the pharmacist note.  The pharmacist also indicated she has a 77-monthsupply at home.  She tells me that she is taking her nintedanib low-dose protocol 100 mg twice daily on schedule and very compliant.  She is not having any diarrhea or GI side effects.  However she is not sure if she has supply at home.  She showed me nearly empty bottle.  I referred the issue to the pharmacist.  Otherwise she is feeling well.  We also do the symptom questionnaire which she is struggled to understand the questions and do it.  [She does have memory loss].  She did remember that she is now stopped the Fosamax altogether.  She wanted know the rationale.  I did explain to her that it is to prevent any potential silent acid reflux which would be a risk factor for ILD getting worse.  She did agree that she was continuing her Imuran.  Her last liver function test  was in April 01, 2022 I reviewed the lab result and it is normal.  Pulmonary function test today.  Shows stability compared to recent PFT.     SYMPTOM SCALE - ILD 10/27/2018  09/08/2019  09/20/2020  08/29/2021  10/31/2021  01/24/2022  04/24/2022 Sruglled to process the questions  O2 use ra ra ra ra ra ra ra  Shortness of Breath symptoms 0 -> 5 scale with 5 being worst (score 6 If unable to do) 0       At rest 0 0 0 0 1 0 0  Simple tasks -  showers, clothes change, eating, shaving 0 0 0 0 1 00 0  Household (dishes, doing bed, laundry) 1 0 '1 2 1 '$ 0 0  Shopping 1 0 0 2 1 0 0  Walking level at own pace 1 0 '1 2 1 1 '$ 0  Walking up Stairs '3 1 1 3 2 1 '$ 0  Total (40 - 48) Dyspnea Score '6 1 3 9 7 2 '$ 0  How bad is your cough? 0 0 0 0 0 0 0  How bad is your fatigue 3 1 0 3.5 1 0 0  nausea  0 0 0 0 0 0  vomit  0 0 0 0 0 0  diarrhea  0 0 0 0 0 0  anzity  0 0 1 0 0 2 due to sick hub  depression  0 0 0 0 0 0  0  Simple office walk 185 feet x  3 laps goal with forehead probe 09/08/2019 Done with mask 09/20/2020  08/29/2021  01/24/2022    O2 used ra ra ra ra   Number laps completed '3 3 3 3   '$ Comments about pace moderate brisk fast avg   Resting Pulse Ox/HR 96% and 91/min 100% and 91% 98% and HR 100 99% and HR 72   Final Pulse Ox/HR 93% and 116/min 99% and 121 98% and HR 104 97% and HR 125   Desaturated </= 88% no no no no   Desaturated <= 3% points yes no no no   Got Tachycardic >/= 90/min yes yes yes yes   Symptoms at end of test Moderate dyspnea due to mask Mild dyspnea No dyspnea Mild dyspnea   Miscellaneous comments x   x     PFT     Latest Ref Rng & Units 04/24/2022    1:58 PM 01/24/2022    8:38 AM 08/29/2021    1:54 PM 09/20/2020   12:02 PM 10/27/2018    8:53 AM 11/10/2017    4:02 PM 04/16/2016   12:44 PM  PFT Results  FVC-Pre L 3.08  P 3.05  3.03  2.95  3.28  3.22  3.14   FVC-Predicted Pre % 99  P 97  96  92  100  98  94   FVC-Post L      3.23  3.14   FVC-Predicted Post %      98   94   Pre FEV1/FVC % % 86  P 86  89  91  88  90  91   Post FEV1/FCV % %      88  93   FEV1-Pre L 2.66  P 2.62  2.69  2.67  2.89  2.89  2.87   FEV1-Predicted Pre % 114  P 112  113  111  117  117  113   FEV1-Post L      2.85  2.92   DLCO uncorrected ml/min/mmHg 22.63  P 14.97  15.61  21.14  19.71  18.84  20.08   DLCO UNC% % 107  P 71  74  100  92  66  70   DLCO corrected ml/min/mmHg 22.50  P 14.97  15.33  20.95      DLCO COR %Predicted % 107  P 71  72  99      DLVA Predicted % 109  P 86  86  114  120  81  79   TLC L      5.04  5.41   TLC %  Predicted %      91  98   RV % Predicted %      82  99     P Preliminary result     Latest Reference Range & Units 02/12/16 09:39 11/13/17 14:10 12/09/17 13:03 02/05/18 14:17 04/20/18 14:56 07/20/18 14:20 08/12/18 07:35 10/14/18 14:24 10/19/18 07:50 12/27/18 08:55 05/31/19 11:41 08/10/19 09:43 11/03/19 10:39 12/13/19 10:11 03/28/20 10:27 06/14/20 13:56 08/31/20 13:54 01/15/21 15:06 03/27/21 10:01 06/14/21 15:40 08/19/21 13:42 12/12/21 14:25 03/26/22 14:23 04/09/22 08:28  AST 0 - 40 IU/L '24 15 19 15 15 15 16 14 13 21 16 14 15 16 20 18 15 18 16 19 17 17 15 16  '$ ALT 0 - 32 IU/L '24 10 12 8 9 8  9 7  10 9 10 11 12 13 9 15  14 14 12 10 11     '$ has a past medical history of Atherosclerosis, Atrophic vaginitis, Cancer ([place]) (1995), Contact lens/glasses fitting, Cystocele, Nasal fracture, Osteopenia (06/2017), PONV (postoperative nausea and vomiting), Raynaud's disease, Rectocele, Rosacea, Scleroderma ([place]), and Urinary frequency.   reports that she quit smoking about 49 years ago. Her smoking use included cigarettes. She started smoking about 59 years ago. She has a 4.50 pack-year smoking history. She has been exposed to tobacco smoke. She has never used smokeless tobacco.  Past Surgical History:  Procedure Laterality Date   APPENDECTOMY     colon cancer  1995   COLON RESECTION  1995   KNEE ARTHROSCOPY Left 01/14/2019   Procedure: ARTHROSCOPY KNEE, PARTIAL  MENISCECTOMY, CHONDROPLASTY AND LOOSE BODY REMOVAL;  Surgeon: Dorna Leitz, MD;  Location: [facility];  Service: Orthopedics;  Laterality: Left;   MASS EXCISION Left 08/31/2012   Procedure: EXCISION MUCOID CYST DEBRIDEMENT DISTAL INTERPHALANGEAL LEFT MIDDLE FINGER;  Surgeon: Wynonia Sours, MD;  Location: [facility];  Service: Orthopedics;  Laterality: Left;   OOPHORECTOMY     LSO   ROTATOR CUFF REPAIR Right    TONSILLECTOMY     VAGINAL HYSTERECTOMY  1994    Allergies  Allergen Reactions   Codeine Nausea And Vomiting   Morphine Other (See Comments)    Other reaction(s): Unknown   Morphine And Related Nausea And Vomiting    Immunization History  Administered Date(s) Administered   Influenza Split 12/12/2010   Influenza, High Dose Seasonal PF 10/24/2017, 11/08/2018   PFIZER(Purple Top)SARS-COV-2 Vaccination 03/18/2019, 04/12/2019, 09/29/2019, 03/27/2020   Pneumococcal Conjugate-13 07/24/2014   Pneumococcal Polysaccharide-23 01/31/2009   Zoster Recombinat (Shingrix) 11/27/2017, 02/12/2018    Family History  Problem Relation Age of Onset   Diabetes Mother    Heart disease Mother    Stroke Mother    Diabetes Brother    Hypertension Brother    Lupus Father      Current Outpatient Medications:    azaTHIOprine (IMURAN) 50 MG tablet, TAKE 1 TABLET(50 MG) BY MOUTH DAILY, Disp: 90 tablet, Rfl: 0   Coenzyme Q10 (COQ10) 200 MG CAPS, Take by mouth daily., Disp: , Rfl:    ezetimibe (ZETIA) 10 MG tablet, Take 1 tablet (10 mg total) by mouth daily., Disp: 90 tablet, Rfl: 3   levothyroxine (SYNTHROID) 50 MCG tablet, Take 1 tablet by mouth every evening. , Disp: , Rfl:    MAGNESIUM CITRATE PO, Take 250 mg by mouth daily., Disp: , Rfl:    Multiple Vitamins-Minerals (PRESERVISION AREDS 2) CAPS, Take 1 capsule by mouth 2 (two) times daily., Disp: , Rfl:    Nintedanib (OFEV) 100  MG CAPS, Take 1 capsule (100 mg total) by mouth 2 (two) times daily., Disp: 180 capsule,  Rfl: 1   rosuvastatin (CRESTOR) 40 MG tablet, Take 1 tablet (40 mg total) by mouth daily., Disp: 90 tablet, Rfl: 3   TURMERIC PO, Take by mouth 2 (two) times daily., Disp: , Rfl:    Vitamin D, Cholecalciferol, 25 MCG (1000 UT) CAPS, Take 2,000 Units by mouth daily., Disp: , Rfl:       Objective:   Vitals:   04/24/22 1457  BP: 106/68  Pulse: 81  Temp: 97.9 F (36.6 C)  TempSrc: Oral  SpO2: 97%  Weight: 164 lb 6.4 oz (74.6 kg)  Height: '5\' 7"'$  (1.702 m)    Estimated body mass index is 25.75 kg/m as calculated from the following:   Height as of this encounter: '5\' 7"'$  (1.702 m).   Weight as of this encounter: 164 lb 6.4 oz (74.6 kg).  '@WEIGHTCHANGE'$ @  [employer]   04/24/22 1457  Weight: 164 lb 6.4 oz (74.6 kg)     Physical Exam   General: No distress. LOOKS WEll.  Neuro: Alert and Oriented x 3. GCS 15. Speech normal Psych: Pleasant Resp:  Barrel Chest - no.  Wheeze - no, Crackles - YES, No overt respiratory distress CVS: Normal heart sounds. Murmurs - no Ext: Stigmata of Connective Tissue Disease -  SCLERODERMA FACE HEENT: Normal upper airway. PEERL +. No post nasal drip        Assessment:       ICD-10-CM   1. Interstitial lung disease due to connective tissue disease ([place])  J84.89    M35.9     2. Scleroderma ([place])  M34.9     3. Encounter for therapeutic drug monitoring  Z51.81          Plan:     Patient Instructions     ICD-10-CM   1. Interstitial lung disease due to connective tissue disease ([place])  J84.89    M35.9     2. Scleroderma ([place])  M34.9     3. Encounter for therapeutic drug monitoring  Z51.81          Interstitial lung disease secondary to scleroderma.- mildly and slwly progressive over time On Ofev since Mar 10, 2022 LFT normal 04/09/22 Glad you stopped  fosfamax because of acid reflux risk    Plan -Continue Imuran that was prescribed by Dr. Keturah Barre for your joint pain - [place] ofev low dose protocol at '100mg'$  twice daily    -  sent note to our pharmacy to ensure you have adequate supply - Check LFT 04/24/2022 and  again in 1 month and 2 months    - need standing orders   Followup -3 months - 30 min visit with Dr Chase Caller   - walk and ILD symptoms score at followup    SIGNATURE    Dr. Brand Males, M.D., F.C.C.P,  Pulmonary and Critical Care Medicine Staff Physician, [place] Director - Interstitial Lung Disease  Program  Pulmonary [place] at [place], [place], [address]  Pager: [phone], If no answer or between  15:00h - 7:00h: call [phone] Telephone: [phone]  3:46 PM 04/24/2022

## 2022-04-25 ENCOUNTER — Telehealth: Payer: Self-pay | Admitting: *Deleted

## 2022-04-25 LAB — HEPATIC FUNCTION PANEL
ALT: 12 U/L (ref 0–35)
AST: 17 U/L (ref 0–37)
Albumin: 4 g/dL (ref 3.5–5.2)
Alkaline Phosphatase: 48 U/L (ref 39–117)
Bilirubin, Direct: 0.1 mg/dL (ref 0.0–0.3)
Total Bilirubin: 0.3 mg/dL (ref 0.2–1.2)
Total Protein: 6.9 g/dL (ref 6.0–8.3)

## 2022-04-25 MED ORDER — OFEV 100 MG PO CAPS: 100.0000 mg | ORAL_CAPSULE | Freq: Two times a day (BID) | ORAL | 1 refills | Status: DC

## 2022-04-25 NOTE — Telephone Encounter (Signed)
I was speaking with the pt originally for an appt for her husband Mr. Alias for pre op clearance. She asked about if she should be taking an ASA daily. She stated that a pharmacist said something to her about taking an ASA daily. I assured the pt that I will send a message to Dr. Alan Ripper nurse Lelon Frohlich to d/w MD if she should be taking an ASA or not and Lelon Frohlich will call her. Pt said thank you to me for her helping her while our call was really about her husband's appt.

## 2022-04-25 NOTE — Telephone Encounter (Signed)
Called patient regarding Ofev rx. She states she has one week supply let. Provided her w phone number to schedule refill. Advised her to call 2 wekes before she run out of Ofev. She verbalized understanding. Nothing further needed.  Knox Saliva, PharmD, MPH, BCPS, CPP Clinical Pharmacist (Rheumatology and Pulmonology)

## 2022-04-28 MED ORDER — ASPIRIN 81 MG PO TBEC: 81.0000 mg | DELAYED_RELEASE_TABLET | Freq: Every day | ORAL | 3 refills | Status: AC

## 2022-04-28 NOTE — Telephone Encounter (Signed)
Last note:  ASSESSMENT AND PLAN:   1.  CAD.  Found on cardiac on chest CT.  Remains symptom free       2 dyslipidemia.  Will increase Crestor to 40 mg   Keep on Zetia   watch diet better   Follow up lipomed and Lpa and Liver panel in 12 wks      3.Hx   Interstitial lung disease.  Followed by Chase Caller.   4.  History of rheumatoid arthritis/scleroderma.  Followed by Dr.Deveshwar   F/U in 1 year       Pt asking if indication for her to be taking ASA.Marland Kitchen with Dr Harrington Challenger out this week.. will forward to the Pharm D for recommendations.

## 2022-04-28 NOTE — Addendum Note (Signed)
Addended by: Stephani Police on: 04/28/2022 04:43 PM   Modules accepted: Orders

## 2022-04-28 NOTE — Telephone Encounter (Signed)
Pt advised and added to her med list.

## 2022-04-28 NOTE — Telephone Encounter (Signed)
Yes, it would be reasonable for her to take low dose ASA 81mg  daily given her CAD.

## 2022-05-05 ENCOUNTER — Telehealth: Payer: Self-pay | Admitting: Internal Medicine

## 2022-05-05 DIAGNOSIS — M349 Systemic sclerosis, unspecified: Secondary | ICD-10-CM

## 2022-05-05 DIAGNOSIS — J8489 Other specified interstitial pulmonary diseases: Secondary | ICD-10-CM

## 2022-05-05 MED ORDER — OFEV 100 MG PO CAPS: 100.0000 mg | ORAL_CAPSULE | Freq: Two times a day (BID) | ORAL | 1 refills | Status: AC

## 2022-05-05 NOTE — Telephone Encounter (Signed)
Rx for Ofev 100mg  twice daily sent to [facility].  LFTs on 04/24/22 wnl  Called pt to advise. She will call pharmacy to schedule  Knox Saliva, PharmD, MPH, BCPS, CPP Clinical Pharmacist (Rheumatology and Pulmonology)

## 2022-05-05 NOTE — Telephone Encounter (Signed)
Patient states needs refill for Ofev. Patient almost out of medication. Patient phone number is [phone].

## 2022-05-27 ENCOUNTER — Other Ambulatory Visit: Payer: Medicare PPO

## 2022-06-12 ENCOUNTER — Telehealth: Payer: Self-pay | Admitting: Internal Medicine

## 2022-06-12 ENCOUNTER — Other Ambulatory Visit: Payer: Self-pay | Admitting: *Deleted

## 2022-06-12 DIAGNOSIS — M359 Systemic involvement of connective tissue, unspecified: Secondary | ICD-10-CM

## 2022-06-12 DIAGNOSIS — Z5181 Encounter for therapeutic drug level monitoring: Secondary | ICD-10-CM

## 2022-06-12 DIAGNOSIS — M349 Systemic sclerosis, unspecified: Secondary | ICD-10-CM

## 2022-06-12 NOTE — Telephone Encounter (Signed)
Called and spoke with patient, advised that lab work was put in for May and July.  The order read to have drawn on 3/14, which was drawn.  She came in April and was told there were no orders for lab work so she left.  I put in for labs for May and July.  The interval was to be 1 month later and 2 months later.  I advised her that I would make Dr. Marchelle Gearing aware that the lab was not drawn in April because it was not put in to be drawn.  She  verbalized understanding.  Dr. Marchelle Gearing, LFT is ordered for May 14th and July 15th.  The April lab was not drawn because the lab was not in the computer.  Please advise if any change needs to be made to the schedule of drawing LFT.  Thank you.

## 2022-06-13 DIAGNOSIS — H00024 Hordeolum internum left upper eyelid: Secondary | ICD-10-CM | POA: Diagnosis not present

## 2022-06-16 NOTE — Telephone Encounter (Signed)
She is on ofev. Defnitely needs LFT testing within next week    Current Outpatient Medications:    aspirin EC 81 MG tablet, Take 1 tablet (81 mg total) by mouth daily. Swallow whole., Disp: 100 tablet, Rfl: 3   azaTHIOprine (IMURAN) 50 MG tablet, TAKE 1 TABLET(50 MG) BY MOUTH DAILY, Disp: 90 tablet, Rfl: 0   Coenzyme Q10 (COQ10) 200 MG CAPS, Take by mouth daily., Disp: , Rfl:    ezetimibe (ZETIA) 10 MG tablet, Take 1 tablet (10 mg total) by mouth daily., Disp: 90 tablet, Rfl: 3   levothyroxine (SYNTHROID) 50 MCG tablet, Take 1 tablet by mouth every evening. , Disp: , Rfl:    MAGNESIUM CITRATE PO, Take 250 mg by mouth daily., Disp: , Rfl:    Multiple Vitamins-Minerals (PRESERVISION AREDS 2) CAPS, Take 1 capsule by mouth 2 (two) times daily., Disp: , Rfl:    Nintedanib (OFEV) 100 MG CAPS, Take 1 capsule (100 mg total) by mouth 2 (two) times daily., Disp: 180 capsule, Rfl: 1   rosuvastatin (CRESTOR) 40 MG tablet, Take 1 tablet (40 mg total) by mouth daily., Disp: 90 tablet, Rfl: 3   TURMERIC PO, Take by mouth 2 (two) times daily., Disp: , Rfl:    Vitamin D, Cholecalciferol, 25 MCG (1000 UT) CAPS, Take 2,000 Units by mouth daily., Disp: , Rfl:

## 2022-06-19 NOTE — Telephone Encounter (Signed)
Called and spoke with pt letting her know the info per Dr. Marchelle Gearing about needing to come to get labwork done and she verbalized understanding. Nothing further needed.

## 2022-06-27 ENCOUNTER — Other Ambulatory Visit (INDEPENDENT_AMBULATORY_CARE_PROVIDER_SITE_OTHER): Payer: Medicare PPO

## 2022-06-27 DIAGNOSIS — Z5181 Encounter for therapeutic drug level monitoring: Secondary | ICD-10-CM | POA: Diagnosis not present

## 2022-06-27 LAB — HEPATIC FUNCTION PANEL
ALT: 17 U/L (ref 0–35)
AST: 20 U/L (ref 0–37)
Albumin: 4.2 g/dL (ref 3.5–5.2)
Alkaline Phosphatase: 57 U/L (ref 39–117)
Bilirubin, Direct: 0.1 mg/dL (ref 0.0–0.3)
Total Bilirubin: 0.4 mg/dL (ref 0.2–1.2)
Total Protein: 7.1 g/dL (ref 6.0–8.3)

## 2022-06-27 NOTE — Addendum Note (Signed)
Addended by: Bernerd Pho I on: 06/27/2022 02:42 PM   Modules accepted: Orders

## 2022-07-06 IMAGING — CT CT CHEST HIGH RESOLUTION W/O CM
2 of 7 series · 15 of 36 positions shown, 18 images · non-contrast
Comparison: 10/19/2017

CLINICAL DATA: Shortness of breath on exertion, history of colon
cancer, status post surgery and chemotherapy, history of
scleroderma, former smoker

EXAM:
CT CHEST WITHOUT CONTRAST
TECHNIQUE: Multidetector CT imaging of the chest was performed following the
standard protocol without intravenous contrast. High resolution
imaging of the lungs, as well as inspiratory and expiratory imaging,
was performed.

[Series 4: chest 2.00 br36 s3 cor soft · coronal · 0.67mm/px · 3 of 190 slices shown]
[im 38/190  lung]
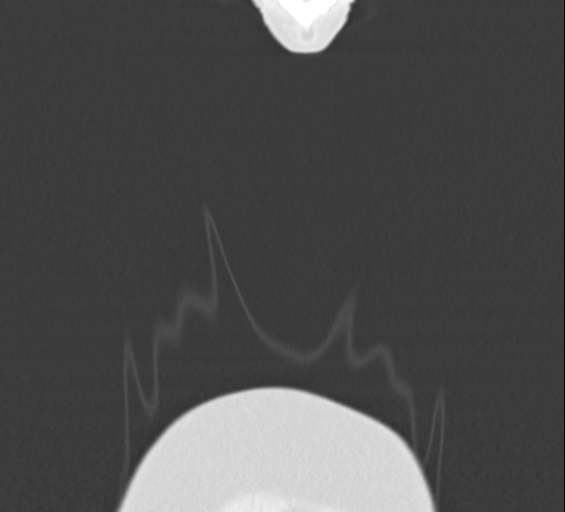
[im 76/190  lung]
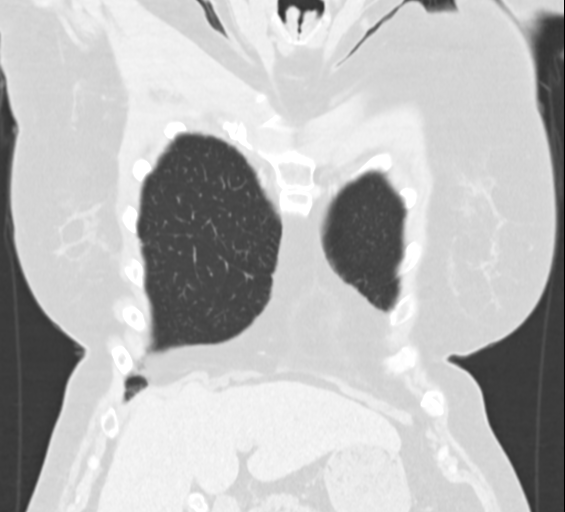
[im 114/190  lung]
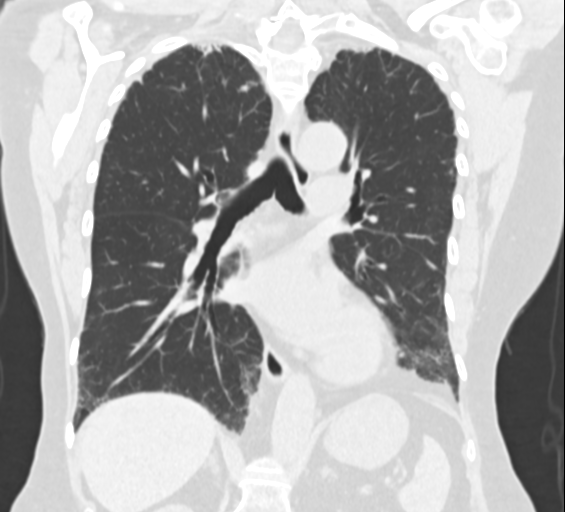

[Series 11: chest 1.00 br60 s3 high res thins 1x1 mm · axial · 0.73mm/px · z∈[+1515,+1805]mm · 12 of 344 slices shown, 15 images]
[im 27/344  mediastinal]
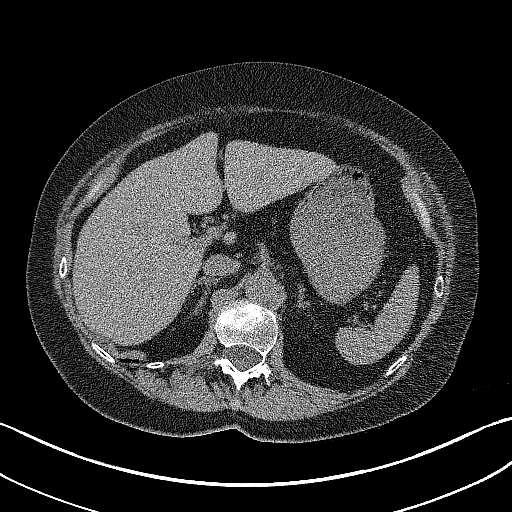
[im 27/344  lung]
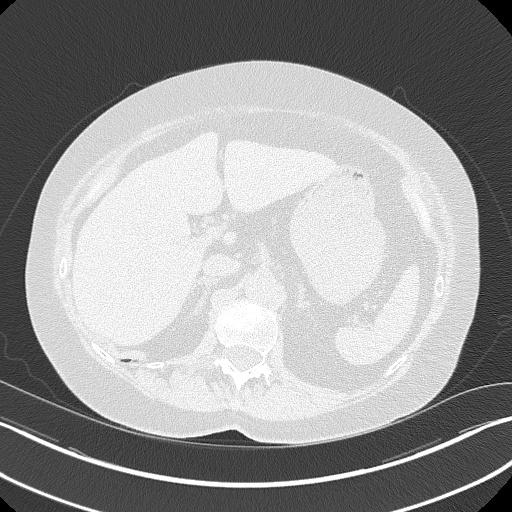
[im 53/344  lung]
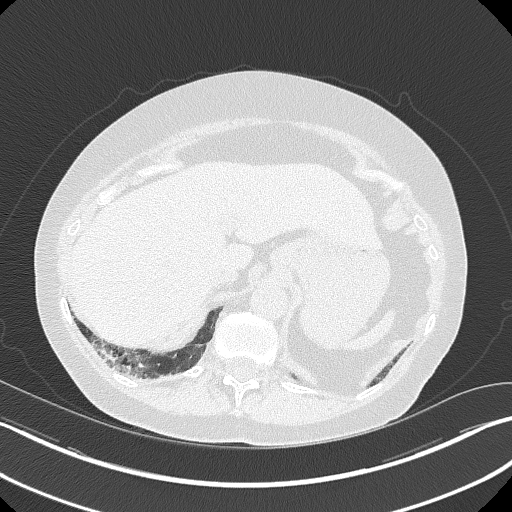
[im 80/344  lung]
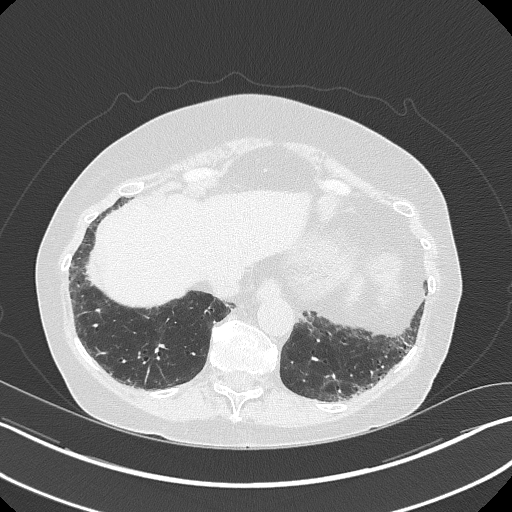
[im 106/344  lung]
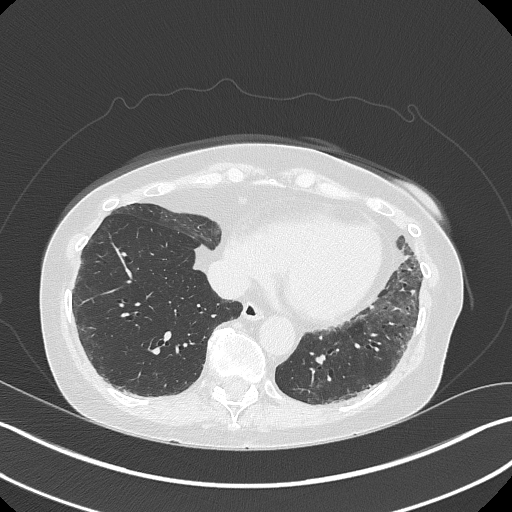
[im 132/344  mediastinal]
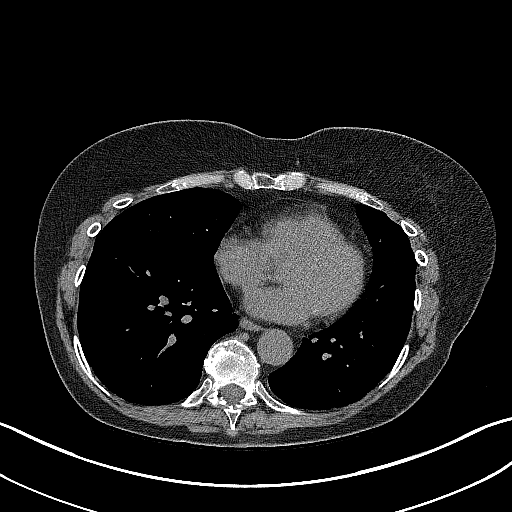
[im 132/344  lung]
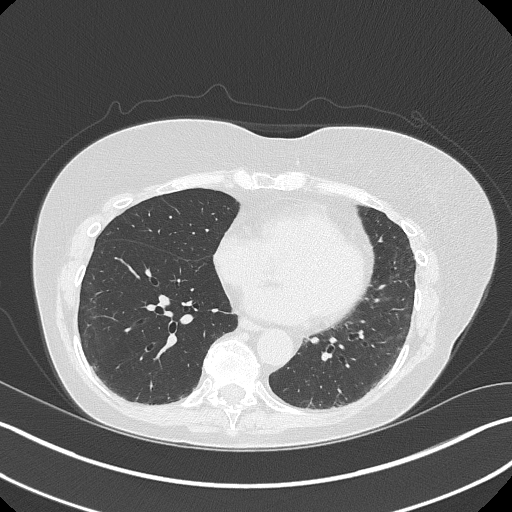
[im 159/344  lung]
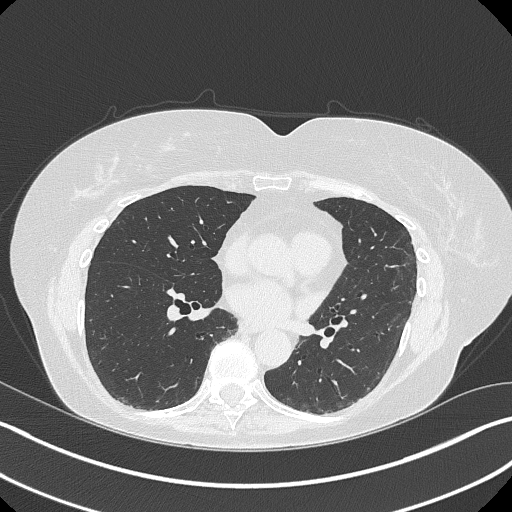
[im 185/344  lung]
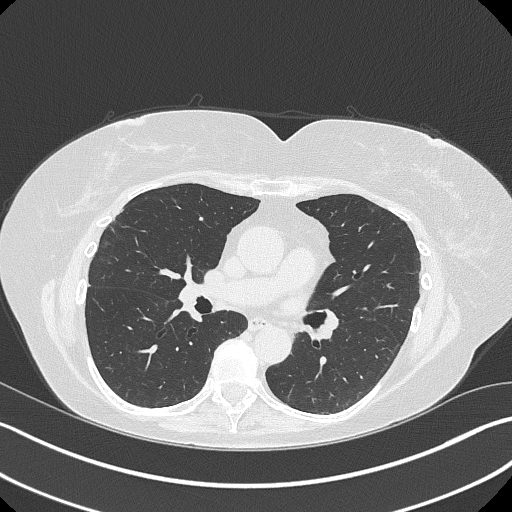
[im 212/344  lung]
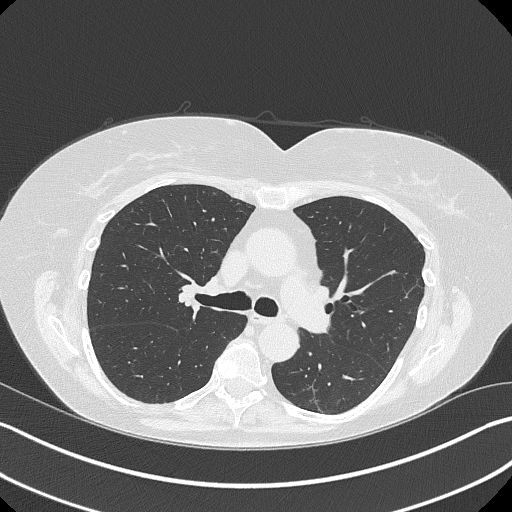
[im 238/344  mediastinal]
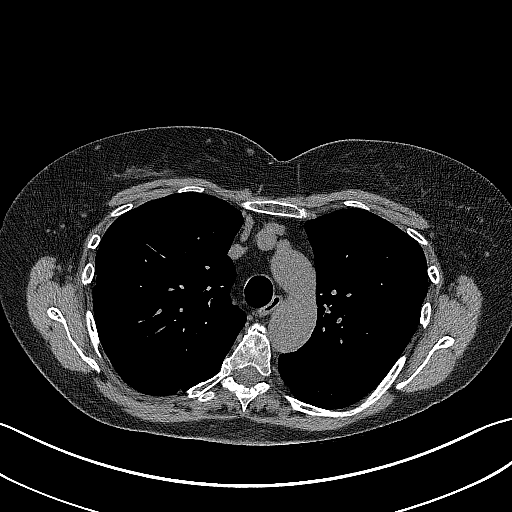
[im 238/344  lung]
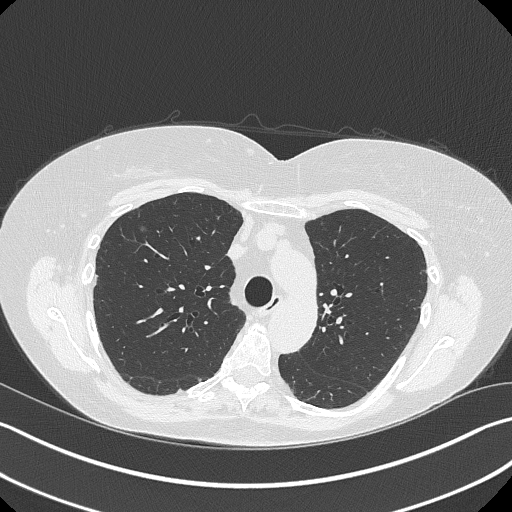
[im 264/344  lung]
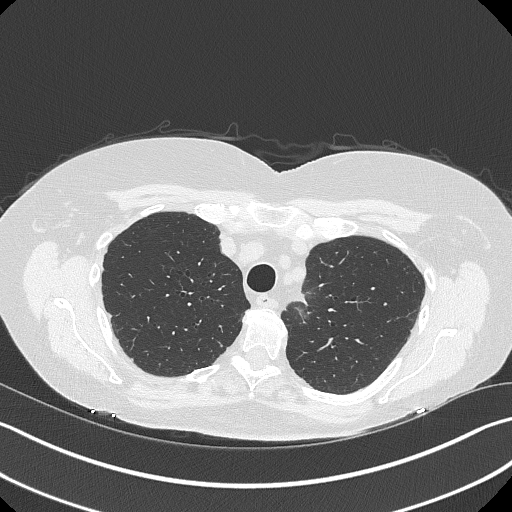
[im 291/344  lung]
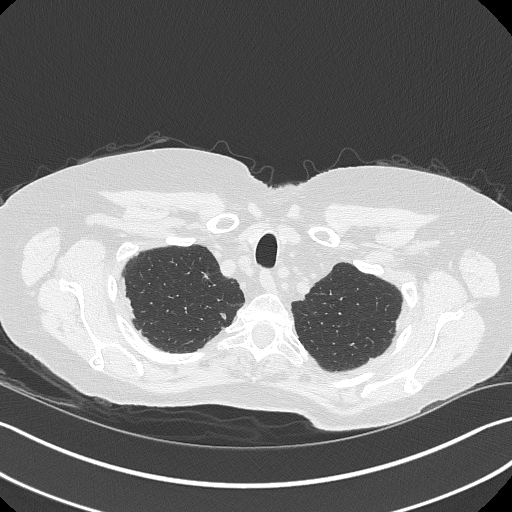
[im 317/344  lung]
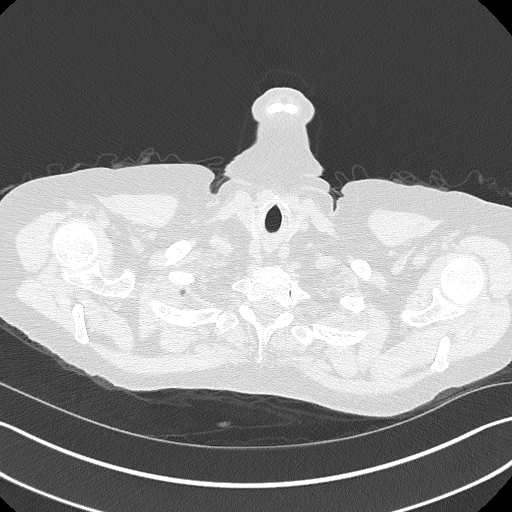

[15 of 36 positions shown; findings below may reference images not displayed]

FINDINGS: Cardiovascular: No significant vascular findings. Normal heart size.
No pericardial effusion.

Mediastinum/Nodes: No enlarged mediastinal, hilar, or axillary lymph
nodes. Thyroid gland, trachea, and esophagus demonstrate no
significant findings.

Lungs/Pleura: There is mild, bibasilar predominant pulmonary
fibrosis in a pattern featuring mild tubular bronchiectasis,
irregular peripheral interstitial and ground-glass opacity and
subtle areas of bronchiolectasis in the lung bases. Fibrotic
findings are not significantly changed compared to prior examination
dated 10/19/2017. Additional biapical pleuroparenchymal scarring,
unchanged. No significant air trapping on expiratory phase imaging.
No pleural effusion or pneumothorax.

Upper Abdomen: No acute abnormality.  Gallstones in the gallbladder.

Musculoskeletal: No chest wall mass or suspicious bone lesions
identified.
IMPRESSION: 1. There is mild, bibasilar predominant pulmonary fibrosis in a
pattern featuring mild tubular bronchiectasis, irregular peripheral
interstitial and ground-glass opacity and subtle areas of
bronchiolectasis in the lung bases. Fibrotic findings are not
significantly changed compared to prior examination dated
10/19/2017. Findings remain most consistent with a "probable UIP"
pattern of fibrosis by ATS pulmonary fibrosis criteria and are
potentially consistent with a connective tissue disorder related
fibrosis given reported history of scleroderma. Findings are
categorized as probable UIP per consensus guidelines: Diagnosis of
Idiopathic Pulmonary Fibrosis: An Official ATS/ERS/JRS/ALAT Clinical
Practice Guideline. Am J Respir Crit Care Med Vol 198, Yoko 5,
ppe88-e[DATE].
2. No CT abnormality of the esophagus given reported history of
scleroderma.
3. Cholelithiasis.

## 2022-07-17 ENCOUNTER — Ambulatory Visit: Payer: Medicare PPO | Admitting: Internal Medicine

## 2022-07-21 DIAGNOSIS — Z124 Encounter for screening for malignant neoplasm of cervix: Secondary | ICD-10-CM | POA: Diagnosis not present

## 2022-07-21 DIAGNOSIS — Z01411 Encounter for gynecological examination (general) (routine) with abnormal findings: Secondary | ICD-10-CM | POA: Diagnosis not present

## 2022-07-21 DIAGNOSIS — N8111 Cystocele, midline: Secondary | ICD-10-CM | POA: Diagnosis not present

## 2022-07-21 DIAGNOSIS — Z1231 Encounter for screening mammogram for malignant neoplasm of breast: Secondary | ICD-10-CM | POA: Diagnosis not present

## 2022-07-21 DIAGNOSIS — Z90711 Acquired absence of uterus with remaining cervical stump: Secondary | ICD-10-CM | POA: Diagnosis not present

## 2022-07-21 DIAGNOSIS — M8580 Other specified disorders of bone density and structure, unspecified site: Secondary | ICD-10-CM | POA: Diagnosis not present

## 2022-07-21 DIAGNOSIS — Z6825 Body mass index (BMI) 25.0-25.9, adult: Secondary | ICD-10-CM | POA: Diagnosis not present

## 2022-07-21 DIAGNOSIS — Z01419 Encounter for gynecological examination (general) (routine) without abnormal findings: Secondary | ICD-10-CM | POA: Diagnosis not present

## 2022-07-22 ENCOUNTER — Ambulatory Visit: Payer: Medicare PPO | Admitting: Internal Medicine

## 2022-07-22 ENCOUNTER — Telehealth: Payer: Self-pay | Admitting: Internal Medicine

## 2022-07-22 ENCOUNTER — Encounter: Payer: Self-pay | Admitting: Internal Medicine

## 2022-07-22 VITALS — BP 140/90 | HR 64 | Ht 67.0 in | Wt 164.6 lb

## 2022-07-22 DIAGNOSIS — Z79899 Other long term (current) drug therapy: Secondary | ICD-10-CM | POA: Diagnosis not present

## 2022-07-22 DIAGNOSIS — J8489 Other specified interstitial pulmonary diseases: Secondary | ICD-10-CM | POA: Diagnosis not present

## 2022-07-22 DIAGNOSIS — Z5181 Encounter for therapeutic drug level monitoring: Secondary | ICD-10-CM | POA: Diagnosis not present

## 2022-07-22 DIAGNOSIS — M349 Systemic sclerosis, unspecified: Secondary | ICD-10-CM | POA: Diagnosis not present

## 2022-07-22 DIAGNOSIS — M359 Systemic involvement of connective tissue, unspecified: Secondary | ICD-10-CM | POA: Diagnosis not present

## 2022-07-22 DIAGNOSIS — M8589 Other specified disorders of bone density and structure, multiple sites: Secondary | ICD-10-CM

## 2022-07-22 NOTE — Progress Notes (Signed)
OV 11/10/2017  Background history followed by Dr. Estanislado Pandy - Scleroderma, limited ([place]) - Hx sclerodactyly, Raynauds, arthralgias, +ANA, +Ro, +La, +RF ; Off PLQ due to Macular Degenration Concern.  - Raynaud's disease without gangrene-it is not very symptomatic currently.  - Sicca syndrome-she continues to have dry mouth and dry eyes.  The symptoms are tolerable.  - Primary osteoarthritis of both hands-she has some changes consistent with osteoarthritis. - Antibiodies - 10/06/17   - neg CCP and RA   Current history -77 year old lady with the above medical problems.  Known to have scleroderma for at least 10 years according to history.  She tells me that she has had insidious onset of shortness of breath for heavy exertion after doing long walks for a few to several years.  It is stable and has not changed.  She thought this was because of aging.  She does not have scleroderma.  She had pulmonary function test in the past but she does not know the result.  Looking at it it shows reduction in diffusion capacity.  Most recently based on the history and review of rheumatology charts it appears that she started having arthritis in her forearm.  This was in August 2019.  Rheumatoid arthritis work-up was negative on serology.  However because the arthritis was felt to be inflammatory methotrexate was considered.  Because of her previous abnormal pulmonary function test high-resolution CT chest was obtained and it showed probable UIP pattern ILD.  I personally visualized the CT chest and agree with those findings.  Repeat pulmonary function test showed stability.  Therefore she has been referred here.  In the phone conversation with Dr. Keturah Barre I was okay with patient starting Imuran against both arthritis and ILD.  At this point that she is feeling stable.  We asked her to do interstitial lung disease questionnaire.  We sent this to her to her house but she forgot to bring it.  Walking desaturation test was  normal without any desaturations.    [facility] Comprehensive ILD Questionnaire  -Symptoms: She has level 3 dyspnea for walking up stairs or walking up a hill but otherwise does not have dyspnea.  She does not cough does not have a  Past medical history: She is occult positive for rheumatoid arthritis for a few weeks.  She she circled positive for scleroderma limited since 2010 but otherwise negative  Review of systems: Positive for fatigue at times for few years.  Arthralgia for several years.  She has had Raynud for several years.  She says after she retired and not having to rush to school and not being exposed to the outdoor air in the winter it is actually much better and not noticeable.  She does have some snoring but it is controlled with Afrin nasal spray.  Personal exposure history: Started smoking 1968 quit in 1974.  Smoked 8 cigarettes a day.  She has lived with a smoker but denies any use of cigars or marijuana cocaine intravenous drug use of vaping products.  Home exposure history: Single-family home suburban 33 years in the same home age of the home of 25 years  Occupational history: 122 questionnaires positive first living in a condition spaces but otherwise negative.  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 ,  MRN: [id number] , ADDRESS: [place] [address]   10/27/2018 -   Chief Complaint  Patient presents with   ILD (interstitial lung disease)    Discuss results of PFT     ICD-10-CM   1. ILD (interstitial lung disease) ([place])  J84.9   2. Scleroderma ([place])  M34.9      HPI Lindsay Herman 77 y.o. -presents for follow-up of interstitial lung disease secondary to scleroderma.  Since her last visit approximately a year ago she continues to be stable.  Symptom  scores are mild.  SHe is on Imuran through Dr Bo Merino.  She reports no new complaints.  She is social distancing quite well.  She had questions about monitoring of her disease and future therapy.  She had pulmonary function test today that shows continued stability.  Her current symptom scores are mild.    OV 09/08/2019   Subjective:  Patient ID: Lindsay Herman, female , DOB: 08/23/1945, age 77 y.o. years. , MRN: [id number],  ADDRESS: [place] [address] PCP  Burnard Bunting, MD Providers : Treatment Team:  Attending Provider: Brand Males, MD   Chief Complaint  Patient presents with   Follow-up    Pt states she has been doing good since last visit and states she feels like she has been stable. Pt denies any complaints with her breathing.       HPI Lindsay Herman 77 y.o. -follow-up for interstitial lung disease secondary to scleroderma on Imuran.  She has mild stable interstitial lung disease.  Last seen in September 2020.  Since then she continues to be stable.  Symptom scores are listed below and extremely mild.  Infectious somewhat better than before.  She continues on Imuran.  Because of stability we discussed and she would not take antifibrotic's.  Also she has a colectomy.  She tells me that [name] who is a patient of mine now disease from scleroderma ILD was a neighboring) for 44 years.  She is thankful for my care to her.  She does not have any chest pain.  She had a high-resolution CT chest and this documents continued stability of her probably UIP pattern of ILD.  She has scheduled PFT test today but given stability of her CT and symptoms wondering if we can skip it - per HPI  IMPRESSION: 1. There is mild, bibasilar predominant pulmonary fibrosis in a pattern featuring mild tubular bronchiectasis, irregular peripheral interstitial and ground-glass opacity and subtle areas of bronchiolectasis in the lung bases. Fibrotic findings are  not significantly changed compared to prior examination dated 10/19/2017. Findings remain most consistent with a "probable UIP" pattern of fibrosis by ATS pulmonary fibrosis criteria and are potentially consistent with a connective tissue disorder related fibrosis given reported history of scleroderma. Findings are categorized as probable UIP per consensus guidelines: Diagnosis of Idiopathic Pulmonary Fibrosis: An Official ATS/ERS/JRS/ALAT Clinical Practice Guideline. [place], Iss 5, [phone], Oct 11 2016. 2. No CT abnormality of the esophagus given reported history of scleroderma. 3. Cholelithiasis.     Electronically Signed   By: Eddie Candle M.D.   On: 09/01/2019 13:56   ROS - per HPI  OV 09/20/2020  Subjective:  Patient ID: Lindsay Herman, female , DOB: Sep 15, 1945 , age 78 y.o. , MRN: [id number] , ADDRESS: [place] [address] PCP Burnard Bunting, MD Patient Care Team: Burnard Bunting, MD as PCP - General (Internal Medicine) Harrington Challenger,  Carmin Muskrat, MD as PCP - Cardiology (Cardiology)  This Provider for this visit: Treatment Team:  Attending Provider: Brand Males, MD    09/20/2020 -   Chief Complaint  Patient presents with   Follow-up    PFT performed today.  Pt states she has been doing okay since last visit. States she does have some hoarseness first thing in the morning.    interstitial lung disease secondary to scleroderma on Imuran Hx of Colectomy  HPI Lindsay Herman 77 y.o. -returns for her annual follow-up.  She says in the last 1 year respiratory status is stable.  ILD symptom score is 3 in the past it is fluctuated between 1 and 6 so she is in the middle.  There is no cough or any other issue.  However she does have mild hoarseness of voice for the last 1 year and it did get worse.  It is then on and off.  She has never seen ENT.  She is a current non-smoker but a former smoker   She continues on Imuran.  She is  worried about the fact she is immunosuppressed.  She social distances and masks.  Her pulmonary function test shows stability DLCO but her FVC is lower.  She thinks it was a technique issue.  Her walking desaturation test and symptom score is stable.      IMPRESSION: HRCT 1. There is mild, bibasilar predominant pulmonary fibrosis in a pattern featuring mild tubular bronchiectasis, irregular peripheral interstitial and ground-glass opacity and subtle areas of bronchiolectasis in the lung bases. Fibrotic findings are not significantly changed compared to prior examination dated 10/19/2017. Findings remain most consistent with a "probable UIP" pattern of fibrosis by ATS pulmonary fibrosis criteria and are potentially consistent with a connective tissue disorder related fibrosis given reported history of scleroderma. Findings are categorized as probable UIP per consensus guidelines: Diagnosis of Idiopathic Pulmonary Fibrosis: An Official ATS/ERS/JRS/ALAT Clinical Practice Guideline. [place], Iss 5, [phone], Oct 11 2016. 2. No CT abnormality of the esophagus given reported history of scleroderma. 3. Cholelithiasis.     Electronically Signed   By: Eddie Candle M.D.   On: 09/01/2019 13:56    OV 08/29/2021  Subjective:  Patient ID: Lindsay Herman, female , DOB: [birth year] , age 84 y.o. , MRN: [id number] , ADDRESS: [place] [id number] PCP Burnard Bunting, MD Patient Care Team: Burnard Bunting, MD as PCP - General (Internal Medicine) Fay Records, MD as PCP - Cardiology (Cardiology)  This Provider for this visit: Treatment Team:  Attending Provider: Brand Males, MD    08/29/2021 -   Chief Complaint  Patient presents with   Follow-up    Patient has no complaints.      HPI Lindsay Herman 77 y.o. -returns for follow-up.  Last seen in August 2022.  Since then she did see Dr Bo Merino on Jun 19, 2021.  Notes from  rheumatology back then noted.  She did say that rheumatology did notice crackles in her lungs.  Patient's feels over the last 1 year she is more short of breath with exertion relieved by rest.  There is no associated chest pain.  There is no cough or wheezing.  No syncope.  She believes this because for the last 3 years because of the pandemic she has not been exercising and every time she tries to go to the [facility] this scared of another respiratory viral circulation in the community.  Therefore she is more sedentary.  She also during the winters used to walk around [employer] but she is not doing that anymore.  She continues to Imuran.  Review of the records indicate last CT scan of the chest is in 2021.  Last echocardiogram 2018.  Social history: Husband had MI 3 years ago and is more sedentary ECOG is 3-4.  He watching TV all the time and eating all the time.  She is frustrated little bit by this.        OV 10/31/2021  Subjective:  Patient ID: Lindsay Herman, female , DOB: Jun 12, 1945 , age 33 y.o. , MRN: [id number] , ADDRESS: [place] [address] PCP Burnard Bunting, MD Patient Care Team: Burnard Bunting, MD as PCP - General (Internal Medicine) Fay Records, MD as PCP - Cardiology (Cardiology) Skeet Latch, MD as Attending Physician (Cardiology)  This Provider for this visit: Treatment Team:  Attending Provider: Brand Males, MD    10/31/2021 -   Chief Complaint  Patient presents with   Follow-up    Pt is here today to discuss results of recent CT. Pt states she has been doing okay since last visit and denies any complaints.    HPI Lindsay Herman 77 y.o. -returns for follow-up.  At last visit there was concern ILD was getting worse because of symptom score was worse.  Today she tells me she saw it improved spontaneously.  Symptom score still shows slight improvement nevertheless compared to a year ago it slightly worse.  Her echocardiogram shows  grade 1 diastolic dysfunction.  She actually told me she did not understand fully what scleroderma meant.  She did not understand her thickening skin and the wrinkles on her face are because of scleroderma.  She says she has been on Imuran for a few years.  She is has history of colectomy but there is no diarrhea.  Our radiologist thought the ILD is slightly worse.  I shared this result with her.  She wanted know about therapeutic options.  We discussed nintedanib but did express concern that given her colectomy she would have diarrhea but at baseline she says she never had any diarrhea.  I personally visualized the scan and compared to 2021 and also 2019.  I personally do not think her ILD is worse now compared to the past.  Her echo has grade 1 diastolic dysfunction.  I noticed that she is on Fosamax which can provoke acid reflux particular in scleroderma and then this in turn can make ILD worse.  I think she should stop this.  Overall we recommended close monitoring because of the side effect profile of approved antifibrotic nintedanib.  The other option would be to do Actemra or CellCept.  CellCept also can cause diarrhea.  Actemra is given subcutaneous and would be a viable option for her but I would need to know if she has at least 10% of lung involvement fibrosis.  I put a message out to the radiologist.  She is already on 1 immunomodulators and so adding another immunomodulator could increase the risk of opportunistic infections.  Therefore overall taking a more close monitoring approach.      OV 01/24/2022  Subjective:  Patient ID: Lindsay Herman, female , DOB: April 18, 1945 , age 38 y.o. , MRN: [id number] , ADDRESS: [place] [address] PCP Burnard Bunting, MD Patient Care Team: Burnard Bunting, MD as PCP - General (Internal Medicine) Fay Records, MD as PCP -  Cardiology (Cardiology) Chilton Si, MD as Attending Physician (Cardiology)  This Provider for this  visit: Treatment Team:  Attending Provider: Kalman Shan, MD    01/24/2022 -   Chief Complaint  Patient presents with   Follow-up    PFT performed today.  Pt states she has been doing okay since last visit and denies any complaints.   HPI Lindsay Herman 77 y.o. -returns for follow-up.  At this visit she actually reports stable symptoms.  Her dyspnea score is actually very low.  I did indicate to her that in the past she had given a high score.  She said that she quite could not understand the questions.  Last visit I realized that she did not have a good grasp of interstitial lung disease and scleroderma and I explained it.  This visit I asked her about scleroderma.  She says that all she notices that she has scleroderma and Dr. Fredrik Cove sees her for this.  She is not fully sure what the implications of scleroderma are.  She is not aware that she has interstitial lung disease although she does realize that she is seeing me for "a lung problem".  Review of the records indicate that she is having short-term memory loss.  She has seen neurology in the spring 2023 and noticed to have mild cognitive impairment.  She was able to recollect details of colon cancer from 1996.  Given the scleroderma last visit I told her to stop her Fosamax.  At that point in time she told me that she was still taking her Fosamax.  I then pointed out to the med list that did not show Fosamax.  Then she did not know what Fosamax was for.  Then she said that she absolutely was not taking Fosamax anymore.  I did inquire about her social status.  She lives with her husband.  Husband's had MI in the past.  She states that he spends his night sleeping in the recliner in the TV room.  She has 2 sons one of them lives in [place].  Another 1 lives in [place].  I did ask if she would be interested in bringing them for a future visit.  She did not feel it was necessary.  She wanted to know the benefit of bringing her children to  her medical visit.  I did indicate to her that it is generally a good idea to have children involved so the full knowledge of the parents health status but again it would be her decision and her right to disclose.  She says she will update her children about her medical conditions.  Reviewed pulmonary function test the DLCO is definitely declining this year.  CT scan of the chest radiologist also felt there was a slight decline.  Therefore I did indicate to her that there is evidence for progression in the interstitial lung disease.  In addition with scleroderma according to Dr. Llana Aliment addendum report there is at least 15% of her lungs involved with fibrosis.  Therefore I told her that she meets criteria to start a second medication which is antifibrotic nintedanib.  She does not have any GI issues other than colectomy.  I did contact the GI doctor through secure chat.  Dr. Marca Ancona did not see any contraindication for nintedanib from a GI standpoint.  I told patient we will start a low-dose protocol.  She is normal liver function and renal function recently.  Some minutes after this I had the research coordinator  give her a consent form for ILD-Pro registry.  By this time she was not fully aware about her interstitial lung disease even though I just discussed this.  So I went back.  She told me she was able to recollect details of interstitial lung disease and our conversation and the decision to start nintedanib and she was fine with it.  She does keep her appointments on time and does follow the instructions.     OV 04/24/2022  Subjective:  Patient ID: Lindsay Herman, female , DOB: 1945/06/21 , age 89 y.o. , MRN: [id number] , ADDRESS: [address] [place] [address] PCP Geoffry Paradise, MD Patient Care Team: Geoffry Paradise, MD as PCP - General (Internal Medicine) Pricilla Riffle, MD as PCP - Cardiology (Cardiology) Chilton Si, MD as Attending Physician (Cardiology)  This  Provider for this visit: Treatment Team:  Attending Provider: Kalman Shan, MD    04/24/2022 -   Chief Complaint  Patient presents with   Follow-up    Pft review,      HPI Lindsay Herman 77 y.o. -returns for follow-up.  I saw her in December 2023 and recommended nintedanib.  She started nintedanib on 03/10/2022.  I reviewed the pharmacist note.  The pharmacist also indicated she has a 72-month supply at home.  She tells me that she is taking her nintedanib low-dose protocol 100 mg twice daily on schedule and very compliant.  She is not having any diarrhea or GI side effects.  However she is not sure if she has supply at home.  She showed me nearly empty bottle.  I referred the issue to the pharmacist.  Otherwise she is feeling well.  We also do the symptom questionnaire which she is struggled to understand the questions and do it.  [She does have memory loss].  She did remember that she is now stopped the Fosamax altogether.  She wanted know the rationale.  I did explain to her that it is to prevent any potential silent acid reflux which would be a risk factor for ILD getting worse.  She did agree that she was continuing her Imuran.  Her last liver function test was in April 01, 2022 I reviewed the lab result and it is normal.  Pulmonary function test today.  Shows stability compared to recent PFT.     OV 07/22/2022  Subjective:  Patient ID: Lindsay Herman, female , DOB: 1945-04-25 , age 77 y.o. , MRN: [id number] , ADDRESS: [address] [place] [address] PCP Geoffry Paradise, MD Patient Care Team: Geoffry Paradise, MD as PCP - General (Internal Medicine) Pricilla Riffle, MD as PCP - Cardiology (Cardiology) Chilton Si, MD as Attending Physician (Cardiology)  This Provider for this visit: Treatment Team:  Attending Provider: Kalman Shan, MD    interstitial lung disease secondary to scleroderma on Imuran and OFev High risk medication use  -Last  high-resolution CT chest July 2021 described as mild probable UIP pattern. -> Aug 2023 with minimal progession  - 15-20% of involvement of fibrosis latest CT Aug 2023  -Started low-dose nintedanib protocol December 2023 Hx of Colectomy due to colon cancer  -Per Dr. Marca Ancona December 2023: colonoscopy was in 01/01/22 , a large polyp was removed, she has history of colectomy in 1996 ( sigmoid).  History of acid reflux disease History of osteopenia on Fosamax   -Advised to talk to the primary care physician and stop it in September 2023 -> she says she stopped it feb 2024  Grade 1 diastolic dysfunction on echo summer 2023 Mild cognitive impairment  -Sees neurology: Spring 2023 [facility] cognitive score 20  07/22/2022 -   Chief Complaint  Patient presents with   Follow-up    Annual f/up no complaints      HPI Lindsay Herman 77 y.o. -presents for follow-up.  At this point she does not endorse any new complaints.  She feels stable although the symptom score seems to worse.  We did a sit/stand hypoxemia test and it is stable.  She says she is tolerating her low-dose nintedanib quite well.  She has mild memory impairment but at this visit she had good recall.  She did tell that her husband who lives with has had an MI in 2017 and since then his quality of life has been "downhill".  His ECOG is 3.  He is very sedentary.  Is eating a lot.  She has to give him his medications and injections.  She says she is compliant with her Imuran.  She is also taking nintedanib low-dose protocol and tolerating it well.   Nintedanib is a high risk medication under my care::Nintedanib/Ofev requires intensive drug monitoring due to high concerns for Adverse effects of , including  Drug Induced Liver Injury, significant GI side effects that include but not limited to Diarrhea, Nausea, Vomiting,  and other system side effects that include Fatigue,  weight loss. Cardiac side effects are a black box warning as well. These will  be monitored with  blood work such as LFT initially once a month for 6 months and then quarterly  Other issue: She stopped her alendronate earlier this year based on advice.  This because scleroderma patients of acid reflux and alendronate increases the risk for acid reflux and acid reflux increases the risk for ILD.  I communicated this with her but but she said her primary care physician was wondering if she brought a note with her.  Have called Dr. Jacky Kindle and left a message for him or his staff to call me back so I can communicate this message directly.->  Fleet Contras from his office called back 10:40 AM and I explained to her.  Directly on the phone  LFT normal 06/27/22 LastHRCt Aug 2023 Lat PFt Marh 2024 Lasst Echo 09/10/21      SYMPTOM SCALE - ILD 10/27/2018  09/08/2019  09/20/2020  08/29/2021  10/31/2021  01/24/2022  04/24/2022 Sruglled to process the questions 07/22/2022   O2 use ra ra ra ra ra ra ra ra  Shortness of Breath symptoms 0 -> 5 scale with 5 being worst (score 6 If unable to do) 0        At rest 0 0 0 0 1 0 0 0  Simple tasks - showers, clothes change, eating, shaving 0 0 0 0 1 00 0 1  Household (dishes, doing bed, laundry) 1 0 1 2 1  0 0 2  Shopping 1 0 0 2 1 0 0 3  Walking level at own pace 1 0 1 2 1 1  0 2  Walking up Stairs 3 1 1 3 2 1  0 3  Total (40 - 48) Dyspnea Score 6 1 3 9 7 2  0 11  How bad is your cough? 0 0 0 0 0 0 0 0  How bad is your fatigue 3 1 0 3.5 1 0 0 2  nausea  0 0 0 0 0 0 0  vomit  0 0 0 0 0 0 0  diarrhea  0 0 0 0 0 0 0  anzity  0 0 1 0 0 2 due to sick hub 0  depression  0 0 0 0 0 0 0  0  Simple office walk 185 feet x  3 laps goal with forehead probe 09/08/2019 Done with mask 09/20/2020  08/29/2021  01/24/2022  07/22/22   O2 used ra ra ra ra ra  Number laps completed 3 3 3 3  Sist stand x 12  Comments about pace moderate brisk fast avg   Resting Pulse Ox/HR 96% and 91/min 100% and 91% 98% and HR 100 99% and HR 72 98% and HR 79  Final Pulse Ox/HR  93% and 116/min 99% and 121 98% and HR 104 97% and HR 125 96% and HR 96  Desaturated </= 88% no no no no   Desaturated <= 3% points yes no no no   Got Tachycardic >/= 90/min yes yes yes yes   Symptoms at end of test Moderate dyspnea due to mask Mild dyspnea No dyspnea Mild dyspnea Level 2 dyspnea  Miscellaneous comments x   x        PFT     Latest Ref Rng & Units 04/24/2022    1:58 PM 01/24/2022    8:38 AM 08/29/2021    1:54 PM 09/20/2020   12:02 PM 10/27/2018    8:53 AM 11/10/2017    4:02 PM 04/16/2016   12:44 PM  PFT Results  FVC-Pre L 3.08  3.05  3.03  2.95  3.28  3.22  3.14   FVC-Predicted Pre % 99  97  96  92  100  98  94   FVC-Post L      3.23  3.14   FVC-Predicted Post %      98  94   Pre FEV1/FVC % % 86  86  89  91  88  90  91   Post FEV1/FCV % %      88  93   FEV1-Pre L 2.66  2.62  2.69  2.67  2.89  2.89  2.87   FEV1-Predicted Pre % 114  112  113  111  117  117  113   FEV1-Post L      2.85  2.92   DLCO uncorrected ml/min/mmHg 22.63  14.97  15.61  21.14  19.71  18.84  20.08   DLCO [facility]% % 107  71  74  100  92  66  70   DLCO corrected ml/min/mmHg 22.50  14.97  15.33  20.95      DLCO COR %Predicted % 107  71  72  99      DLVA Predicted % 109  86  86  114  120  81  79   TLC L      5.04  5.41   TLC % Predicted %      91  98   RV % Predicted %      82  99      Latest Reference Range & Units 02/12/16 09:39 11/13/17 14:10 12/09/17 13:03 02/05/18 14:17 04/20/18 14:56 07/20/18 14:20 08/12/18 07:35 10/14/18 14:24 10/19/18 07:50 12/27/18 08:55 05/31/19 11:41 08/10/19 09:43 11/03/19 10:39 12/13/19 10:11 03/28/20 10:27 06/14/20 13:56 08/31/20 13:54 01/15/21 15:06 03/27/21 10:01 06/14/21 15:40 08/19/21 13:42 12/12/21 14:25 03/26/22 14:23 04/09/22 08:28 04/24/22 15:56 06/27/22 14:42  AST 0 - 37 U/L 24 15 19 15 15 15 16 14 13 21 16 14 15 16 20 18 15 18  16  19 17 17 15 16 17 20   ALT 0 - 35 U/L 24 10 12 8 9 8  9 7  10 9 10 11 12 13 9 15  14 14 12 10 11 12 17      has a past medical history  of Atherosclerosis, Atrophic vaginitis, Cancer (HCC) (1995), Contact lens/glasses fitting, Cystocele, Nasal fracture, Osteopenia (06/2017), PONV (postoperative nausea and vomiting), Raynaud's disease, Rectocele, Rosacea, Scleroderma (HCC), and Urinary frequency.   reports that she quit smoking about 49 years ago. Her smoking use included cigarettes. She started smoking about 59 years ago. She has a 4.50 pack-year smoking history. She has been exposed to tobacco smoke. She has never used smokeless tobacco.  Past Surgical History:  Procedure Laterality Date   APPENDECTOMY     colon cancer  1995   COLON RESECTION  1995   KNEE ARTHROSCOPY Left 01/14/2019   Procedure: ARTHROSCOPY KNEE, PARTIAL MENISCECTOMY, CHONDROPLASTY AND LOOSE BODY REMOVAL;  Surgeon: Jodi Geralds, MD;  Location: [facility];  Service: Orthopedics;  Laterality: Left;   MASS EXCISION Left 08/31/2012   Procedure: EXCISION MUCOID CYST DEBRIDEMENT DISTAL INTERPHALANGEAL LEFT MIDDLE FINGER;  Surgeon: Nicki Reaper, MD;  Location: [facility];  Service: Orthopedics;  Laterality: Left;   OOPHORECTOMY     LSO   ROTATOR CUFF REPAIR Right    TONSILLECTOMY     VAGINAL HYSTERECTOMY  1994    Allergies  Allergen Reactions   Codeine Nausea And Vomiting   Morphine Other (See Comments)    Other reaction(s): Unknown   Morphine And Codeine Nausea And Vomiting    Immunization History  Administered Date(s) Administered   Influenza Split 12/12/2010   Influenza, High Dose Seasonal PF 10/24/2017, 11/08/2018   PFIZER(Purple Top)SARS-COV-2 Vaccination 03/18/2019, 04/12/2019, 09/29/2019, 03/27/2020   Pneumococcal Conjugate-13 07/24/2014   Pneumococcal Polysaccharide-23 01/31/2009   Zoster Recombinat (Shingrix) 11/27/2017, 02/12/2018    Family History  Problem Relation Age of Onset   Diabetes Mother    Heart disease Mother    Stroke Mother    Diabetes Brother    Hypertension Brother    Lupus Father       Current Outpatient Medications:    aspirin EC 81 MG tablet, Take 1 tablet (81 mg total) by mouth daily. Swallow whole., Disp: 100 tablet, Rfl: 3   azaTHIOprine (IMURAN) 50 MG tablet, TAKE 1 TABLET(50 MG) BY MOUTH DAILY, Disp: 90 tablet, Rfl: 0   Coenzyme Q10 (COQ10) 200 MG CAPS, Take by mouth daily., Disp: , Rfl:    ezetimibe (ZETIA) 10 MG tablet, Take 1 tablet (10 mg total) by mouth daily., Disp: 90 tablet, Rfl: 3   levothyroxine (SYNTHROID) 50 MCG tablet, Take 1 tablet by mouth every evening. , Disp: , Rfl:    MAGNESIUM CITRATE PO, Take 250 mg by mouth daily., Disp: , Rfl:    Multiple Vitamins-Minerals (PRESERVISION AREDS 2) CAPS, Take 1 capsule by mouth 2 (two) times daily., Disp: , Rfl:    Nintedanib (OFEV) 100 MG CAPS, Take 1 capsule (100 mg total) by mouth 2 (two) times daily., Disp: 180 capsule, Rfl: 1   rosuvastatin (CRESTOR) 40 MG tablet, Take 1 tablet (40 mg total) by mouth daily., Disp: 90 tablet, Rfl: 3   TURMERIC PO, Take by mouth 2 (two) times daily., Disp: , Rfl:    Vitamin D, Cholecalciferol, 25 MCG (1000 UT) CAPS, Take 2,000 Units by mouth daily., Disp: , Rfl:       Objective:   Vitals:  07/22/22 0847  BP: (!) 140/90  Pulse: 64  SpO2: 100%  Weight: 164 lb 9.6 oz (74.7 kg)  Height: 5\' 7"  (1.702 m)    Estimated body mass index is 25.78 kg/m as calculated from the following:   Height as of this encounter: 5\' 7"  (1.702 m).   Weight as of this encounter: 164 lb 9.6 oz (74.7 kg).  @WEIGHTCHANGE @  [employer]   07/22/22 0847  Weight: 164 lb 9.6 oz (74.7 kg)     Physical Exam   General: No distress. Looks well O2 at rest: no Cane present: no Sitting in wheel chair: no Frail: no Obese: no Neuro: Alert and Oriented x 3. GCS 15. Speech normal Psych: Pleasant Resp:  Barrel Chest - no.  Wheeze - no, Crackles - very mild, No overt respiratory distress CVS: Normal heart sounds. Murmurs - no Ext: Stigmata of Connective Tissue Disease -  SCLERORDERMA HEENT: Normal upper airway. PEERL +. No post nasal drip        Assessment:       ICD-10-CM   1. Interstitial lung disease due to connective tissue disease (HCC)  J84.89    M35.9     2. Scleroderma (HCC)  M34.9     3. Encounter for therapeutic drug monitoring  Z51.81     4. High risk medication use  Z79.899     5. Osteopenia of multiple sites  M85.89      Nintedanib/Ofev requires intensive drug monitoring due to high concerns for Adverse effects of , including  Drug Induced Liver Injury, significant GI side effects that include but not limited to Diarrhea, Nausea, Vomiting,  and other system side effects that include Fatigue,  weight loss. Cardiac side effects are a black box warning as well. These will be monitored with  blood work such as LFT initially once a month for 6 months and then quarterly     Plan:     Patient Instructions     ICD-10-CM   1. Interstitial lung disease due to connective tissue disease (HCC)  J84.89    M35.9     2. Scleroderma (HCC)  M34.9     3. Encounter for therapeutic drug monitoring  Z51.81     4. High risk medication use  Z79.899     5. Osteopenia of multiple sites  M85.89         #ILD  Interstitial lung disease secondary to scleroderma.- mildly and slwly progressive over time but stable since last visit On Ofev since Mar 10, 2022 LFT normal 04/09/22 and MAy 2024   Plan -Continue Imuran that was prescribed by Dr. Algis Downs for your joint pain - Cotninue ofev low dose protocol at 100mg  twice daily   - recheck LFT in 3 months - 4 months - do spirometry and dlco in 4 months  #Osteopenia - Scleroderma patients do have silent acid reflux.  Alendronate also increases the risk for acid reflux and this is why I recommended you stop it  Plan - Please ask your primary care physician to give another agent within the same family to protect your bones -I will have our CMA call your primary care office and document and communicate this  concept  Followup -4 months - 15 min visit with Dr Marchelle Gearing   - walk and ILD symptoms score at followup    SIGNATURE    Dr. Kalman Shan, M.D., F.C.C.P,  Pulmonary and Critical Care Medicine Staff Physician, [facility] Director - Interstitial Lung Disease  Program  [facility] [place], [place], [address]  Pager: [phone], If no answer or between  15:00h - 7:00h: call [phone] Telephone: [phone]  9:05 AM 07/22/2022    HIGh Complexity NEW OFFICE  The table below is from the 2021 E/M guidelines, first released in 2021, with minor revisions added in 2023. Must meet the requirements for 2 out of 3 dimensions to qualify.    Number and complexity of problems addressed Amount and/or complexity of data reviewed Risk of complications and/or morbidity  Severe exacerbation of chronic illness  Acute or chronic illnesses that may pose a threat to life or bodily function, e.g., multiple trauma, acute MI, pulmonary embolus, severe respiratory distress, progressive rheumatoid arthritis, psychiatric illness with potential threat to self or others, peritonitis, acute renal failure, abrupt change in neurological status Must meet the requirements for 2 of 3 of the categories)  Category 1: Tests and documents, historian  Any combination of 3 of the following:  Assessment requiring an independent historian  Review of prior external note(s) from each unique source  Review of results of each unique test - LFT May 2024  Ordering of each unique test - LFT and PFT in 3 mnonths    Category 2: Interpretation of tests    Independent interpretation of a test performed by another physician/other qualified health care professional (not separately reported)  Category 3: Discuss management/tests  Discussion of management or test interpretation with external physician/other qualified health care  professional/appropriate source (not separately reported) - DR Jacky Kindle  HIGH risk of morbidity from additional diagnostic testing or treatment Examples only:  Drug therapy requiring intensive monitoring for toxicity  Decision for elective major surgery with identified pateint or procedure risk factors  Decision regarding hospitalization or escalation of level of care  Decision for DNR or to de-escalate care   Parenteral controlled  substances

## 2022-07-22 NOTE — Telephone Encounter (Signed)
Pt calling in b/c she believes Dr. Marchelle Gearing got in contact with someone else instead of PT Gyn (Dr. Noland Fordyce @ [phone]) to inform them of a medicine he doesn't want her to take.

## 2022-07-22 NOTE — Patient Instructions (Addendum)
ICD-10-CM   1. Interstitial lung disease due to connective tissue disease (HCC)  J84.89    M35.9     2. Scleroderma (HCC)  M34.9     3. Encounter for therapeutic drug monitoring  Z51.81     4. High risk medication use  Z79.899     5. Osteopenia of multiple sites  M85.89         #ILD  Interstitial lung disease secondary to scleroderma.- mildly and slwly progressive over time but stable since last visit On Ofev since Mar 10, 2022 LFT normal 04/09/22 and MAy 2024   Plan -Continue Imuran that was prescribed by Dr. Algis Downs for your joint pain - Cotninue ofev low dose protocol at 100mg  twice daily   - recheck LFT in 3 months - 4 months - do spirometry and dlco in 4 months  #Osteopenia - Scleroderma patients do have silent acid reflux.  Alendronate also increases the risk for acid reflux and this is why I recommended you stop it  Plan - Please ask your primary care physician to give another agent within the same family to protect your bones - I will call Dr Jacky Kindle - left message with RAchel his assistant to call back  Followup -4 months - 15 min visit with Dr Marchelle Gearing   - walk and ILD symptoms score at followup

## 2022-07-25 NOTE — Telephone Encounter (Signed)
I spoke to pt and she stated that she could not remember her doctors name that prescribed Alendronate for her when she had her office visit with Dr Marchelle Gearing. And that Dr Marchelle Gearing recommended her stop taking this medication. But her GYN doctor prescribed it for bone density. And she called [place] to give [place] the GYN dr's name and phone number. So, I informed pt that I will pass along the message of the doctors name and phone number so Dr Marchelle Gearing can speak to the other doctor and discuss other medication options. Pt verbalized understanding. Nothing further needed.

## 2022-07-27 NOTE — Telephone Encounter (Signed)
Lindsay Herman/Lindsay Herman  Please call Dr. Moody Bruins office and let them know that we recommended stopping the alendronate because of acid reflux risk which in turn increases the risk for progressive ILD.  They could consider another medication in the same class that does not cause acid reflux to help patient's bone health

## 2022-07-29 NOTE — Telephone Encounter (Signed)
I called Dr Elpidio Eric office and spoke with her nurse, French Ana and made aware of recommendations from Dr Marchelle Gearing. She verbalized understanding and will give info to Dr. Ernestina Penna. Nothing further needed.

## 2022-08-06 DIAGNOSIS — M8580 Other specified disorders of bone density and structure, unspecified site: Secondary | ICD-10-CM | POA: Diagnosis not present

## 2022-08-12 ENCOUNTER — Telehealth: Payer: Self-pay | Admitting: Internal Medicine

## 2022-08-12 ENCOUNTER — Other Ambulatory Visit: Payer: Self-pay | Admitting: Obstetrics

## 2022-08-12 NOTE — Telephone Encounter (Signed)
Patient states needs to reschedule labwork appointment. Patient phone number is [phone].

## 2022-08-15 NOTE — Telephone Encounter (Signed)
Spoke with patient. Advised she didn't need a apt to get lab work done. I advised there is a current order for labs that dont expire until next year. Patient states she will come by Monday.  Closing encounter. NFN

## 2022-08-18 ENCOUNTER — Other Ambulatory Visit: Payer: Self-pay | Admitting: *Deleted

## 2022-08-18 DIAGNOSIS — I7300 Raynaud's syndrome without gangrene: Secondary | ICD-10-CM

## 2022-08-18 DIAGNOSIS — M349 Systemic sclerosis, unspecified: Secondary | ICD-10-CM

## 2022-08-18 DIAGNOSIS — E559 Vitamin D deficiency, unspecified: Secondary | ICD-10-CM

## 2022-08-18 DIAGNOSIS — J849 Interstitial pulmonary disease, unspecified: Secondary | ICD-10-CM

## 2022-08-18 DIAGNOSIS — Z79899 Other long term (current) drug therapy: Secondary | ICD-10-CM | POA: Diagnosis not present

## 2022-08-18 DIAGNOSIS — M1990 Unspecified osteoarthritis, unspecified site: Secondary | ICD-10-CM

## 2022-08-18 LAB — CBC WITH DIFFERENTIAL/PLATELET
Absolute Monocytes: 680 cells/uL (ref 200–950)
HCT: 42.6 % (ref 35.0–45.0)
MCV: 96.4 fL (ref 80.0–100.0)
RBC: 4.42 10*6/uL (ref 3.80–5.10)
Total Lymphocyte: 16.8 %

## 2022-08-19 ENCOUNTER — Other Ambulatory Visit: Payer: Self-pay | Admitting: *Deleted

## 2022-08-19 DIAGNOSIS — E559 Vitamin D deficiency, unspecified: Secondary | ICD-10-CM

## 2022-08-19 LAB — CBC WITH DIFFERENTIAL/PLATELET
Basophils Absolute: 33 cells/uL (ref 0–200)
Basophils Relative: 0.5 %
Eosinophils Absolute: 40 cells/uL (ref 15–500)
Eosinophils Relative: 0.6 %
Hemoglobin: 13.9 g/dL (ref 11.7–15.5)
Lymphs Abs: 1109 cells/uL (ref 850–3900)
MCH: 31.4 pg (ref 27.0–33.0)
MCHC: 32.6 g/dL (ref 32.0–36.0)
MPV: 10.6 fL (ref 7.5–12.5)
Monocytes Relative: 10.3 %
Neutro Abs: 4739 cells/uL (ref 1500–7800)
Neutrophils Relative %: 71.8 %
Platelets: 321 10*3/uL (ref 140–400)
RDW: 12.8 % (ref 11.0–15.0)
WBC: 6.6 10*3/uL (ref 3.8–10.8)

## 2022-08-19 LAB — COMPLETE METABOLIC PANEL WITH GFR
AG Ratio: 1.6 (calc) (ref 1.0–2.5)
ALT: 18 U/L (ref 6–29)
AST: 18 U/L (ref 10–35)
Albumin: 3.9 g/dL (ref 3.6–5.1)
Alkaline phosphatase (APISO): 55 U/L (ref 37–153)
BUN: 12 mg/dL (ref 7–25)
CO2: 26 mmol/L (ref 20–32)
Calcium: 9.5 mg/dL (ref 8.6–10.4)
Chloride: 105 mmol/L (ref 98–110)
Creat: 0.81 mg/dL (ref 0.60–1.00)
Globulin: 2.5 g/dL (calc) (ref 1.9–3.7)
Glucose, Bld: 137 mg/dL — ABNORMAL HIGH (ref 65–99)
Potassium: 4.2 mmol/L (ref 3.5–5.3)
Sodium: 139 mmol/L (ref 135–146)
Total Bilirubin: 0.4 mg/dL (ref 0.2–1.2)
Total Protein: 6.4 g/dL (ref 6.1–8.1)
eGFR: 75 mL/min/{1.73_m2} (ref >=60)

## 2022-08-19 LAB — VITAMIN D 25 HYDROXY (VIT D DEFICIENCY, FRACTURES): Vit D, 25-Hydroxy: 19 ng/mL — ABNORMAL LOW (ref 30–100)

## 2022-08-19 MED ORDER — VITAMIN D (ERGOCALCIFEROL) 1.25 MG (50000 UNIT) PO CAPS: 50000.0000 [IU] | ORAL_CAPSULE | ORAL | 0 refills | Status: AC

## 2022-08-19 NOTE — Progress Notes (Signed)
Office Visit Note  Patient: Lindsay Herman             Date of Birth: 1945-06-29           MRN: [id number]             PCP: Geoffry Paradise, MD Referring: Geoffry Paradise, MD Visit Date: 08/28/2022 Occupation: @GUAROCC @  Subjective:  Medication management  History of Present Illness: Lindsay Herman is a 77 y.o. female with a scleroderma and ILD.  She was evaluated by Dr. Colletta Maryland on July 22, 2022.  She continues to be on Imuran 50 mg p.o. daily and Ofev 100 mg twice daily.  Her last high-resolution CT was in September 30, 2021.  This showed minimal progression when compared to the studies of 2001.  She had evaluation by Dr. Gala Romney in the past and echocardiogram in 2018 did not show any pulmonary hypertension.  She was evaluated by Dr. Tenny Craw in November 2023 for coronary artery disease found on the chest CT.  Patient was asymptomatic and no treatment was advised.  She continues to be on Crestor and Zetia for hyperlipidemia.  She was prescribed vitamin D 50,000 units once a week for low vitamin D.  Patient has not started the medication yet.  Patient denies any worsening of the skin tightness or Raynauds.  She denies any history of digital ulcers.  No increased shortness of breath reported.    Activities of Daily Living:  Patient reports morning stiffness for 0 minutes.   Patient Denies nocturnal pain.  Difficulty dressing/grooming: Denies Difficulty climbing stairs: Denies Difficulty getting out of chair: Denies Difficulty using hands for taps, buttons, cutlery, and/or writing: Denies  Review of Systems  Constitutional:  Positive for fatigue.  HENT:  Negative for mouth sores and mouth dryness.   Eyes:  Negative for dryness.  Respiratory:  Negative for shortness of breath.   Cardiovascular:  Negative for chest pain and palpitations.  Gastrointestinal:  Negative for blood in stool, constipation and diarrhea.  Endocrine: Negative for increased urination.  Genitourinary:  Negative  for involuntary urination.  Musculoskeletal:  Negative for joint pain, gait problem, joint pain, joint swelling, myalgias, muscle weakness, morning stiffness, muscle tenderness and myalgias.  Skin:  Negative for color change, rash, hair loss and sensitivity to sunlight.  Allergic/Immunologic: Negative for susceptible to infections.  Neurological:  Negative for dizziness and headaches.  Hematological:  Negative for swollen glands.  Psychiatric/Behavioral:  Positive for depressed mood. Negative for sleep disturbance. The patient is not nervous/anxious.     PMFS History:  Patient Active Problem List   Diagnosis Date Noted   Mild cognitive impairment 06/30/2021   Acute medial meniscal tear, left, initial encounter 01/14/2019   Chondromalacia, left knee 01/14/2019   Loose body of left knee 01/14/2019   Scleroderma, limited (HCC) 02/10/2016   History of environmental allergies 02/10/2016   Gastroesophageal reflux disease 02/10/2016   History of colon cancer 02/10/2016   Sicca syndrome 02/01/2016   History of repair of right rotator cuff 02/01/2016   Primary osteoarthritis of both knees 02/01/2016   Primary osteoarthritis of both hands 02/01/2016   Raynaud's disease without gangrene 02/01/2016   Cystocele    Atrophic vaginitis    Osteopenia    Menopausal symptoms     Past Medical History:  Diagnosis Date   Atherosclerosis    aorta and coronary artery   Atrophic vaginitis    Cancer (HCC) 1995   Colon cancer tx surgery and chemo   Contact  lens/glasses fitting    wears contacts or glasses   Cystocele    Nasal fracture    Osteopenia 06/2017   T score -2.0 FRAX 13% / 2.8%   PONV (postoperative nausea and vomiting)    Raynaud's disease    Rectocele    Rosacea    Scleroderma (HCC)    Urinary frequency     Family History  Problem Relation Age of Onset   Diabetes Mother    Heart disease Mother    Stroke Mother    Diabetes Brother    Hypertension Brother    Lupus Father     Past Surgical History:  Procedure Laterality Date   APPENDECTOMY     colon cancer  1995   COLON RESECTION  1995   KNEE ARTHROSCOPY Left 01/14/2019   Procedure: ARTHROSCOPY KNEE, PARTIAL MENISCECTOMY, CHONDROPLASTY AND LOOSE BODY REMOVAL;  Surgeon: Jodi Geralds, MD;  Location: [facility];  Service: Orthopedics;  Laterality: Left;   MASS EXCISION Left 08/31/2012   Procedure: EXCISION MUCOID CYST DEBRIDEMENT DISTAL INTERPHALANGEAL LEFT MIDDLE FINGER;  Surgeon: Nicki Reaper, MD;  Location: [facility];  Service: Orthopedics;  Laterality: Left;   OOPHORECTOMY     LSO   ROTATOR CUFF REPAIR Right    TONSILLECTOMY     VAGINAL HYSTERECTOMY  1994   Social History   Social History Narrative   Right handed   Drinks caffeine   Two story home   Immunization History  Administered Date(s) Administered   Influenza Split 12/12/2010   Influenza, High Dose Seasonal PF 10/24/2017, 11/08/2018   PFIZER(Purple Top)SARS-COV-2 Vaccination 03/18/2019, 04/12/2019, 09/29/2019, 03/27/2020   Pneumococcal Conjugate-13 07/24/2014   Pneumococcal Polysaccharide-23 01/31/2009   Zoster Recombinant(Shingrix) 11/27/2017, 02/12/2018     Objective: Vital Signs: BP 127/73 (BP Location: Left Arm, Patient Position: Sitting, Cuff Size: Small)   Pulse 85   Resp 12   Ht 5\' 6"  (1.676 m)   Wt 165 lb (74.8 kg)   BMI 26.63 kg/m    Physical Exam Vitals and nursing note reviewed.  Constitutional:      Appearance: She is well-developed.  HENT:     Head: Normocephalic and atraumatic.  Eyes:     Conjunctiva/sclera: Conjunctivae normal.  Cardiovascular:     Rate and Rhythm: Normal rate and regular rhythm.     Heart sounds: Normal heart sounds.  Pulmonary:     Effort: Pulmonary effort is normal.     Breath sounds: Normal breath sounds.     Comments: Crackles audible in the lung bases. Abdominal:     General: Bowel sounds are normal.     Palpations: Abdomen is soft.  Musculoskeletal:      Cervical back: Normal range of motion.  Lymphadenopathy:     Cervical: No cervical adenopathy.  Skin:    General: Skin is warm and dry.     Capillary Refill: Capillary refill takes less than 2 seconds.     Comments: Telengectesia noted on the face, neck and palmar aspect of her hands.  Sclerodactyly distal to MCPs.  Nailbed capillary changes noted.  No digital ulcers noted.  Neurological:     Mental Status: She is alert and oriented to person, place, and time.  Psychiatric:        Behavior: Behavior normal.      Musculoskeletal Exam: She had limited lateral rotation of the cervical spine.  Shoulder joints, elbow joints, wrist joints were in good range of motion.  She had bilateral PIP and DIP  thickening with no synovitis.  Sclerodactyly was noted.  Hip joints and knee joints were in good range of motion.  There was no tenderness over ankles or MTPs.  CDAI Exam: CDAI Score: -- Patient Global: --; Provider Global: -- Swollen: --; Tender: -- Joint Exam 08/28/2022   No joint exam has been documented for this visit   There is currently no information documented on the homunculus. Go to the Rheumatology activity and complete the homunculus joint exam.  Investigation: No additional findings.  Imaging: No results found.  Recent Labs: Lab Results  Component Value Date   WBC 6.6 08/18/2022   HGB 13.9 08/18/2022   PLT 321 08/18/2022   NA 139 08/18/2022   K 4.2 08/18/2022   CL 105 08/18/2022   CO2 26 08/18/2022   GLUCOSE 137 (H) 08/18/2022   BUN 12 08/18/2022   CREATININE 0.81 08/18/2022   BILITOT 0.4 08/18/2022   ALKPHOS 57 06/27/2022   AST 18 08/18/2022   ALT 18 08/18/2022   PROT 6.4 08/18/2022   ALBUMIN 4.2 06/27/2022   CALCIUM 9.5 08/18/2022   GFRAA 97 06/14/2020   QFTBGOLDPLUS NEGATIVE 10/06/2017    Speciality Comments: No specialty comments available.  Procedures:  No procedures performed Allergies: Codeine, Morphine, and Morphine and codeine   Assessment  / Plan:     Visit Diagnoses: Scleroderma, limited (HCC) - Hx sclerodactyly, Raynauds, arthralgias, ILD, +ANA, +Ro, +La, -RF.  Patient has not noticed any increased skin tightness.  Raynauds is currently not active.  She denies digital ulcers.  She denies any increased shortness of breath.  She has been followed by Dr. Colletta Maryland and Dr. Tenny Craw.  She remains on the combination of Imuran and Ofev.  She denies any side effects from the medications.  Chronic inflammatory arthritis - RF-, CCP-, 14-3-3 eta negative, R wrist synovitis, erosive changes, intercarpal and radiocarpal joint space narrowing.  Patient had no synovitis on the examination.  She has not had inflammatory arthritis failure since she has been on Imuran.  Interstitial lung disease (HCC) - followed by Dr. Marchelle Gearing and Dietrich Pates.  Notes from Dr. Jane Canary and Dr. Charlott Rakes visit were reviewed as described above.  High risk medication use - Imuran 50 mg po daily. Off PLQ due to Macular Degenration Concern.  Ofev 100 mg p.o. twice daily started on March 10, 2022.  Labs from August 18, 2022 CBC and CMP were reviewed.  She was advised to get labs every 3 months to monitor for drug toxicity.  Information for immunization was placed in the AVS.  She was advised to hold Imuran if she develops an infection.  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  Keeping core temperature warm and warm clothing was advised.  History of repair of right rotator cuff-she has intermittent discomfort.  She had good range of motion.  Primary osteoarthritis of both hands-bilateral PIP and DIP thickening was noted.  Joint protection was discussed.  Primary osteoarthritis of both knees-she denies any comfort in her knee joints.  No warmth swelling or effusion was noted.  S/P arthroscopy of right knee  Osteopenia of multiple sites - Jun 14, 2021 DEXA scan T-score -2.3, BMD 0.593 left femoral neck no  comparison.reclast 5 mg IV 08/25/22 by GYN Dr. Ernestina Penna. Ttd with fosamax in the past.  Vitamin D deficiency-vitamin D was low at 19 on August 18, 2022.  She was placed on vitamin D 50,000 units/week.  Patient states she has not started vitamin  D supplement.  She was strongly advised to start vitamin D as soon as possible.  She should have repeat vitamin D level in 3 months.  History of colon cancer  Orders: No orders of the defined types were placed in this encounter.  No orders of the defined types were placed in this encounter.    Follow-Up Instructions: Return for Scleroderma, ILD.   Pollyann Savoy, MD  Note - This record has been created using Animal nutritionist.  Chart creation errors have been sought, but may not always  have been located. Such creation errors do not reflect on  the standard of medical care.

## 2022-08-19 NOTE — Telephone Encounter (Signed)
-----   Message from Pollyann Savoy, MD sent at 08/19/2022  7:57 AM EDT ----- Vitamin D is low.  Patient should take vitamin D 50,000 units once a week for 3 months.  Repeat vitamin D level after 3 months.  Then maintain on vitamin D 2000 units daily.  CBC and CMP are within normal limits.  Glucose is mildly elevated, probably  not a fasting sample.

## 2022-08-19 NOTE — Progress Notes (Signed)
Vitamin D is low.  Patient should take vitamin D 50,000 units once a week for 3 months.  Repeat vitamin D level after 3 months.  Then maintain on vitamin D 2000 units daily.  CBC and CMP are within normal limits.  Glucose is mildly elevated, probably not a fasting sample.

## 2022-08-21 ENCOUNTER — Other Ambulatory Visit (HOSPITAL_COMMUNITY): Payer: Self-pay

## 2022-08-25 ENCOUNTER — Ambulatory Visit (HOSPITAL_COMMUNITY)
Admission: RE | Admit: 2022-08-25 | Discharge: 2022-08-25 | Disposition: A | Discharge status: Home / Self Care | Payer: Medicare PPO | Source: Ambulatory Visit | Attending: Obstetrics | Admitting: Obstetrics

## 2022-08-25 DIAGNOSIS — M899 Disorder of bone, unspecified: Secondary | ICD-10-CM | POA: Insufficient documentation

## 2022-08-25 MED ORDER — ZOLEDRONIC ACID 5 MG/100ML IV SOLN
INTRAVENOUS | Status: AC
  Administered 2022-08-25: 5 mg via INTRAVENOUS
  Filled 2022-08-25: qty 100

## 2022-08-25 MED ORDER — ZOLEDRONIC ACID 5 MG/100ML IV SOLN: 5.0000 mg | Freq: Once | INTRAVENOUS | Status: AC

## 2022-08-28 ENCOUNTER — Ambulatory Visit: Payer: Medicare PPO | Attending: Rheumatology | Admitting: Rheumatology

## 2022-08-28 ENCOUNTER — Encounter: Payer: Self-pay | Admitting: Rheumatology

## 2022-08-28 VITALS — BP 127/73 | HR 85 | Resp 12 | Ht 66.0 in | Wt 165.0 lb

## 2022-08-28 DIAGNOSIS — Z9889 Other specified postprocedural states: Secondary | ICD-10-CM

## 2022-08-28 DIAGNOSIS — M349 Systemic sclerosis, unspecified: Secondary | ICD-10-CM

## 2022-08-28 DIAGNOSIS — Z85038 Personal history of other malignant neoplasm of large intestine: Secondary | ICD-10-CM

## 2022-08-28 DIAGNOSIS — E559 Vitamin D deficiency, unspecified: Secondary | ICD-10-CM

## 2022-08-28 DIAGNOSIS — I7300 Raynaud's syndrome without gangrene: Secondary | ICD-10-CM | POA: Diagnosis not present

## 2022-08-28 DIAGNOSIS — M19041 Primary osteoarthritis, right hand: Secondary | ICD-10-CM

## 2022-08-28 DIAGNOSIS — E8889 Other specified metabolic disorders: Secondary | ICD-10-CM

## 2022-08-28 DIAGNOSIS — M170 Bilateral primary osteoarthritis of knee: Secondary | ICD-10-CM | POA: Diagnosis not present

## 2022-08-28 DIAGNOSIS — M8589 Other specified disorders of bone density and structure, multiple sites: Secondary | ICD-10-CM

## 2022-08-28 DIAGNOSIS — Z79899 Other long term (current) drug therapy: Secondary | ICD-10-CM

## 2022-08-28 DIAGNOSIS — J849 Interstitial pulmonary disease, unspecified: Secondary | ICD-10-CM

## 2022-08-28 DIAGNOSIS — M1990 Unspecified osteoarthritis, unspecified site: Secondary | ICD-10-CM

## 2022-08-28 DIAGNOSIS — M79671 Pain in right foot: Secondary | ICD-10-CM

## 2022-08-28 DIAGNOSIS — M19042 Primary osteoarthritis, left hand: Secondary | ICD-10-CM

## 2022-08-28 NOTE — Patient Instructions (Signed)
Standing Labs We placed an order today for your standing lab work.   Please have your standing labs drawn in October and every 3 months  Please have your labs drawn 2 weeks prior to your appointment so that the provider can discuss your lab results at your appointment, if possible.  Please note that you may see your imaging and lab results in MyChart before we have reviewed them. We will contact you once all results are reviewed. Please allow our office up to 72 hours to thoroughly review all of the results before contacting the office for clarification of your results.  WALK-IN LAB HOURS  Monday through Thursday from 8:00 am -12:30 pm and 1:00 pm-5:00 pm and Friday from 8:00 am-12:00 pm.  Patients with office visits requiring labs will be seen before walk-in labs.  You may encounter longer than normal wait times. Please allow additional time. Wait times may be shorter on  Monday and Thursday afternoons.  We do not book appointments for walk-in labs. We appreciate your patience and understanding with our staff.   Labs are drawn by [facility]. Please bring your co-pay at the time of your lab draw.  You may receive a bill from [facility] for your lab work.  Please note if you are on Hydroxychloroquine and and an order has been placed for a Hydroxychloroquine level,  you will need to have it drawn 4 hours or more after your last dose.  If you wish to have your labs drawn at another location, please call the office 24 hours in advance so we can fax the orders.  The office is located at [address], [place], [place] [address]   If you have any questions regarding directions or hours of operation,  please call [phone].   As a reminder, please drink plenty of water prior to coming for your lab work. Thanks!   Vaccines You are taking a medication(s) that can suppress your immune system.  The following immunizations are recommended: Flu annually Covid-19  RSV Td/Tdap (tetanus,  diphtheria, pertussis) every 10 years Pneumonia (Prevnar 15 then Pneumovax 23 at least 1 year apart.  Alternatively, can take Prevnar 20 without needing additional dose) Shingrix: 2 doses from 4 weeks to 6 months apart  Please check with your PCP to make sure you are up to date.   If you have signs or symptoms of an infection or start antibiotics: First, call your PCP for workup of your infection. Hold your medication through the infection, until you complete your antibiotics, and until symptoms resolve if you take the following: Injectable medication (Actemra, Benlysta, Cimzia, Cosentyx, Enbrel, Humira, Kevzara, Orencia, Remicade, Simponi, Stelara, Taltz, Tremfya) Methotrexate Leflunomide (Arava) Mycophenolate (Cellcept) Harriette Ohara, Olumiant, or Rinvoq

## 2022-09-02 DIAGNOSIS — L820 Inflamed seborrheic keratosis: Secondary | ICD-10-CM | POA: Diagnosis not present

## 2022-09-02 DIAGNOSIS — D2261 Melanocytic nevi of right upper limb, including shoulder: Secondary | ICD-10-CM | POA: Diagnosis not present

## 2022-09-02 DIAGNOSIS — L821 Other seborrheic keratosis: Secondary | ICD-10-CM | POA: Diagnosis not present

## 2022-09-02 DIAGNOSIS — Z85828 Personal history of other malignant neoplasm of skin: Secondary | ICD-10-CM | POA: Diagnosis not present

## 2022-09-02 DIAGNOSIS — L570 Actinic keratosis: Secondary | ICD-10-CM | POA: Diagnosis not present

## 2022-09-02 DIAGNOSIS — L814 Other melanin hyperpigmentation: Secondary | ICD-10-CM | POA: Diagnosis not present

## 2022-10-20 DIAGNOSIS — E785 Hyperlipidemia, unspecified: Secondary | ICD-10-CM | POA: Diagnosis not present

## 2022-10-20 DIAGNOSIS — I10 Essential (primary) hypertension: Secondary | ICD-10-CM | POA: Diagnosis not present

## 2022-10-20 DIAGNOSIS — E611 Iron deficiency: Secondary | ICD-10-CM | POA: Diagnosis not present

## 2022-10-20 DIAGNOSIS — D649 Anemia, unspecified: Secondary | ICD-10-CM | POA: Diagnosis not present

## 2022-10-20 DIAGNOSIS — E039 Hypothyroidism, unspecified: Secondary | ICD-10-CM | POA: Diagnosis not present

## 2022-10-20 DIAGNOSIS — Z1212 Encounter for screening for malignant neoplasm of rectum: Secondary | ICD-10-CM | POA: Diagnosis not present

## 2022-10-27 DIAGNOSIS — H35363 Drusen (degenerative) of macula, bilateral: Secondary | ICD-10-CM | POA: Diagnosis not present

## 2022-11-05 DIAGNOSIS — I700 Atherosclerosis of aorta: Secondary | ICD-10-CM | POA: Diagnosis not present

## 2022-11-05 DIAGNOSIS — E039 Hypothyroidism, unspecified: Secondary | ICD-10-CM | POA: Diagnosis not present

## 2022-11-05 DIAGNOSIS — E785 Hyperlipidemia, unspecified: Secondary | ICD-10-CM | POA: Diagnosis not present

## 2022-11-05 DIAGNOSIS — Z1331 Encounter for screening for depression: Secondary | ICD-10-CM | POA: Diagnosis not present

## 2022-11-05 DIAGNOSIS — I10 Essential (primary) hypertension: Secondary | ICD-10-CM | POA: Diagnosis not present

## 2022-11-05 DIAGNOSIS — Z636 Dependent relative needing care at home: Secondary | ICD-10-CM | POA: Diagnosis not present

## 2022-11-05 DIAGNOSIS — Z85038 Personal history of other malignant neoplasm of large intestine: Secondary | ICD-10-CM | POA: Diagnosis not present

## 2022-11-05 DIAGNOSIS — Z0000 Encounter for general adult medical examination without abnormal findings: Secondary | ICD-10-CM | POA: Diagnosis not present

## 2022-11-05 DIAGNOSIS — J849 Interstitial pulmonary disease, unspecified: Secondary | ICD-10-CM | POA: Diagnosis not present

## 2022-11-05 DIAGNOSIS — Z1339 Encounter for screening examination for other mental health and behavioral disorders: Secondary | ICD-10-CM | POA: Diagnosis not present

## 2022-11-05 DIAGNOSIS — R82998 Other abnormal findings in urine: Secondary | ICD-10-CM | POA: Diagnosis not present

## 2022-11-15 ENCOUNTER — Other Ambulatory Visit: Payer: Self-pay | Admitting: Rheumatology

## 2022-11-15 DIAGNOSIS — E559 Vitamin D deficiency, unspecified: Secondary | ICD-10-CM

## 2022-11-24 ENCOUNTER — Other Ambulatory Visit: Payer: Self-pay | Admitting: *Deleted

## 2022-11-24 DIAGNOSIS — Z79899 Other long term (current) drug therapy: Secondary | ICD-10-CM | POA: Diagnosis not present

## 2022-11-24 DIAGNOSIS — I7300 Raynaud's syndrome without gangrene: Secondary | ICD-10-CM

## 2022-11-24 DIAGNOSIS — M349 Systemic sclerosis, unspecified: Secondary | ICD-10-CM

## 2022-11-24 DIAGNOSIS — E559 Vitamin D deficiency, unspecified: Secondary | ICD-10-CM

## 2022-11-24 DIAGNOSIS — J849 Interstitial pulmonary disease, unspecified: Secondary | ICD-10-CM | POA: Diagnosis not present

## 2022-11-24 DIAGNOSIS — M1990 Unspecified osteoarthritis, unspecified site: Secondary | ICD-10-CM | POA: Diagnosis not present

## 2022-11-25 LAB — CBC WITH DIFFERENTIAL/PLATELET
Absolute Monocytes: 896 {cells}/uL (ref 200–950)
Basophils Absolute: 38 {cells}/uL (ref 0–200)
Basophils Relative: 0.6 %
Eosinophils Absolute: 51 {cells}/uL (ref 15–500)
Eosinophils Relative: 0.8 %
HCT: 45.5 % — ABNORMAL HIGH (ref 35.0–45.0)
Hemoglobin: 14.5 g/dL (ref 11.7–15.5)
Lymphs Abs: 954 {cells}/uL (ref 850–3900)
MCH: 31.3 pg (ref 27.0–33.0)
MCHC: 31.9 g/dL — ABNORMAL LOW (ref 32.0–36.0)
MCV: 98.1 fL (ref 80.0–100.0)
MPV: 11.1 fL (ref 7.5–12.5)
Monocytes Relative: 14 %
Neutro Abs: 4461 {cells}/uL (ref 1500–7800)
Neutrophils Relative %: 69.7 %
Platelets: 346 10*3/uL (ref 140–400)
RBC: 4.64 10*6/uL (ref 3.80–5.10)
RDW: 12.8 % (ref 11.0–15.0)
Total Lymphocyte: 14.9 %
WBC: 6.4 10*3/uL (ref 3.8–10.8)

## 2022-11-25 LAB — COMPLETE METABOLIC PANEL WITH GFR
AG Ratio: 1.7 (calc) (ref 1.0–2.5)
ALT: 19 U/L (ref 6–29)
AST: 18 U/L (ref 10–35)
Albumin: 4.3 g/dL (ref 3.6–5.1)
Alkaline phosphatase (APISO): 54 U/L (ref 37–153)
BUN: 12 mg/dL (ref 7–25)
CO2: 27 mmol/L (ref 20–32)
Calcium: 9.8 mg/dL (ref 8.6–10.4)
Chloride: 103 mmol/L (ref 98–110)
Creat: 0.81 mg/dL (ref 0.60–1.00)
Globulin: 2.5 g/dL (ref 1.9–3.7)
Glucose, Bld: 110 mg/dL — ABNORMAL HIGH (ref 65–99)
Potassium: 4.7 mmol/L (ref 3.5–5.3)
Sodium: 140 mmol/L (ref 135–146)
Total Bilirubin: 0.5 mg/dL (ref 0.2–1.2)
Total Protein: 6.8 g/dL (ref 6.1–8.1)
eGFR: 75 mL/min/{1.73_m2} (ref >=60)

## 2022-11-25 LAB — VITAMIN D 25 HYDROXY (VIT D DEFICIENCY, FRACTURES): Vit D, 25-Hydroxy: 38 ng/mL (ref 30–100)

## 2022-11-25 NOTE — Progress Notes (Signed)
CBC, CMP and vitamin D are within normal limits.

## 2023-01-04 ENCOUNTER — Other Ambulatory Visit: Payer: Self-pay | Admitting: Rheumatology

## 2023-01-05 NOTE — Telephone Encounter (Signed)
Last Fill: 12/19/2021  Labs: 11/24/2022 CBC, CMP and vitamin D are within normal limits.   Next Visit: 02/17/2023  Last Visit: 08/28/2022  DX: Scleroderma, limited   Current Dose per office note 08/28/2022: Imuran 50 mg po daily   Okay to refill Imuran?

## 2023-01-23 ENCOUNTER — Ambulatory Visit: Payer: Medicare PPO | Admitting: Internal Medicine

## 2023-01-26 ENCOUNTER — Ambulatory Visit (HOSPITAL_BASED_OUTPATIENT_CLINIC_OR_DEPARTMENT_OTHER): Payer: Medicare PPO

## 2023-01-26 ENCOUNTER — Ambulatory Visit (HOSPITAL_BASED_OUTPATIENT_CLINIC_OR_DEPARTMENT_OTHER): Payer: Medicare PPO | Admitting: Internal Medicine

## 2023-01-26 ENCOUNTER — Ambulatory Visit: Payer: Medicare PPO | Admitting: Internal Medicine

## 2023-01-26 VITALS — BP 143/80 | HR 80 | Ht 66.25 in | Wt 161.4 lb

## 2023-01-26 DIAGNOSIS — R490 Dysphonia: Secondary | ICD-10-CM | POA: Diagnosis not present

## 2023-01-26 DIAGNOSIS — M349 Systemic sclerosis, unspecified: Secondary | ICD-10-CM | POA: Diagnosis not present

## 2023-01-26 DIAGNOSIS — J8489 Other specified interstitial pulmonary diseases: Secondary | ICD-10-CM

## 2023-01-26 DIAGNOSIS — M359 Systemic involvement of connective tissue, unspecified: Secondary | ICD-10-CM | POA: Diagnosis not present

## 2023-01-26 DIAGNOSIS — Z5181 Encounter for therapeutic drug level monitoring: Secondary | ICD-10-CM

## 2023-01-26 DIAGNOSIS — Z79899 Other long term (current) drug therapy: Secondary | ICD-10-CM | POA: Diagnosis not present

## 2023-01-26 LAB — PULMONARY FUNCTION TEST
DL/VA % pred: 119 %
DL/VA: 4.82 ml/min/mmHg/L
DLCO cor % pred: 93 %
DLCO cor: 19.05 ml/min/mmHg
DLCO unc % pred: 94 %
DLCO unc: 19.17 ml/min/mmHg
FEF 25-75 Pre: 3.64 L/s
FEF2575-%Pred-Pre: 221 %
FEV1-%Pred-Pre: 118 %
FEV1-Pre: 2.64 L
FEV1FVC-%Pred-Pre: 116 %
FEV6-%Pred-Pre: 107 %
FEV6-Pre: 3.04 L
FEV6FVC-%Pred-Pre: 105 %
FVC-%Pred-Pre: 102 %
FVC-Pre: 3.05 L
Pre FEV1/FVC ratio: 87 %
Pre FEV6/FVC Ratio: 100 %

## 2023-01-26 LAB — HEPATIC FUNCTION PANEL
ALT: 16 U/L (ref 0–35)
AST: 17 U/L (ref 0–37)
Albumin: 4.2 g/dL (ref 3.5–5.2)
Alkaline Phosphatase: 56 U/L (ref 39–117)
Bilirubin, Direct: 0.1 mg/dL (ref 0.0–0.3)
Total Bilirubin: 0.4 mg/dL (ref 0.2–1.2)
Total Protein: 6.9 g/dL (ref 6.0–8.3)

## 2023-01-26 NOTE — Patient Instructions (Signed)
Spirometry and DLCO Performed Today.  

## 2023-01-26 NOTE — Progress Notes (Signed)
Spirometry and DLCO Performed Today.  

## 2023-01-26 NOTE — Patient Instructions (Addendum)
ICD-10-CM   1. Interstitial lung disease due to connective tissue disease (HCC)  J84.89    M35.9     2. Scleroderma (HCC)  M34.9     3. Encounter for therapeutic drug monitoring  Z51.81     4. High risk medication use  Z79.899     5. Hoarseness of voice  R49.0          #ILD  Interstitial lung disease secondary to scleroderma.- mildly and slwly progressive over time but stable since last visit [facility] since Mar 10, 2022 and toearing it well  LFT normal Oct 2024   Plan -Continue Imuran that was prescribed by Dr. Algis Downs for your joint pain - Cotn[place]ue ofev low dose protocol at 100mg  twice daily    - rehceck LFT 01/26/2023 - do spirometry and dlco in 6 months  #VOice hoarseness   - Scleroderma patients do have silent acid reflux. And this can be caussing voice change  Plan - refer ENT  Followup -6 months - 15 min visit with Dr Marchelle Gearing   - walk and ILD symptoms score at followup

## 2023-01-26 NOTE — Progress Notes (Signed)
OV 11/10/2017  Background history followed by Dr. Corliss Skains - Scleroderma, limited (HCC) - Hx sclerodactyly, Raynauds, arthralgias, +ANA, +Ro, +La, +RF ; Off PLQ due to Macular Degenration Concern.  - Raynaud's disease without gangrene-it is not very symptomatic currently.  - Sicca syndrome-she continues to have dry mouth and dry eyes.  The symptoms are tolerable.  - Primary osteoarthritis of both hands-she has some changes consistent with osteoarthritis. - Antibiodies - 10/06/17   - neg CCP and RA   Current history -77 year old lady with the above medical problems.  Known to have scleroderma for at least 10 years according to history.  She tells me that she has had insidious onset of shortness of breath for heavy exertion after doing long walks for a few to several years.  It is stable and has not changed.  She thought this was because of aging.  She does not have scleroderma.  She had pulmonary function test in the past but she does not know the result.  Looking at it it shows reduction in diffusion capacity.  Most recently based on the history and review of rheumatology charts it appears that she started having arthritis in her forearm.  This was in August 2019.  Rheumatoid arthritis work-up was negative on serology.  However because the arthritis was felt to be inflammatory methotrexate was considered.  Because of her previous abnormal pulmonary function test high-resolution CT chest was obtained and it showed probable UIP pattern ILD.  I personally visualized the CT chest and agree with those findings.  Repeat pulmonary function test showed stability.  Therefore she has been referred here.  In the phone conversation with Dr. Algis Downs I was okay with patient starting Imuran against both arthritis and ILD.  At this point that she is feeling stable.  We asked her to do interstitial lung disease questionnaire.  We sent this to her to her house but she forgot to bring it.  Walking desaturation test was  normal without any desaturations.    [facility] Comprehensive ILD Questionnaire  -Symptoms: She has level 3 dyspnea for walking up stairs or walking up a hill but otherwise does not have dyspnea.  She does not cough does not have a  Past medical history: She is occult positive for rheumatoid arthritis for a few weeks.  She she circled positive for scleroderma limited since 2010 but otherwise negative  Review of systems: Positive for fatigue at times for few years.  Arthralgia for several years.  She has had Raynud for several years.  She says after she retired and not having to rush to school and not being exposed to the outdoor air in the winter it is actually much better and not noticeable.  She does have some snoring but it is controlled with Afrin nasal spray.  Personal exposure history: Started smoking 1968 quit in 1974.  Smoked 8 cigarettes a day.  She has lived with a smoker but denies any use of cigars or marijuana cocaine intravenous drug use of vaping products.  Home exposure history: Single-family home [place] years in the same home age of the home of 85 years  Occupational history: 122 questionnaires positive first living in a condition spaces but otherwise negative.  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 ,  MRN: [id number] , ADDRESS: [address] [place] [address]   10/27/2018 -   Chief Complaint  Patient presents with   ILD (interstitial lung disease)    Discuss results of PFT     ICD-10-CM   1. ILD (interstitial lung disease) (HCC)  J84.9   2. Scleroderma (HCC)  M34.9      HPI Lindsay Herman 77 y.o. -presents for follow-up of interstitial lung disease secondary to scleroderma.  Since her last visit approximately a year ago she continues to be stable.  Symptom  scores are mild.  SHe is on Imuran through Dr Pollyann Savoy.  She reports no new complaints.  She is social distancing quite well.  She had questions about monitoring of her disease and future therapy.  She had pulmonary function test today that shows continued stability.  Her current symptom scores are mild.    OV 09/08/2019   Subjective:  Patient ID: Lindsay Herman, female , DOB: 24-Oct-1945, age 77 y.o. years. , MRN: [id number],  ADDRESS: [address] [place] [address] PCP  Geoffry Paradise, MD Providers : Treatment Team:  Attending Provider: Kalman Shan, MD   Chief Complaint  Patient presents with   Follow-up    Pt states she has been doing good since last visit and states she feels like she has been stable. Pt denies any complaints with her breathing.       HPI Lindsay Herman 77 y.o. -follow-up for interstitial lung disease secondary to scleroderma on Imuran.  She has mild stable interstitial lung disease.  Last seen in September 2020.  Since then she continues to be stable.  Symptom scores are listed below and extremely mild.  Infectious somewhat better than before.  She continues on Imuran.  Because of stability we discussed and she would not take antifibrotic's.  Also she has a colectomy.  She tells me that Monico Blitz who is a patient of mine now disease from scleroderma ILD was a neighboring) for 45 years.  She is thankful for my care to her.  She does not have any chest pain.  She had a high-resolution CT chest and this documents continued stability of her probably UIP pattern of ILD.  She has scheduled PFT test today but given stability of her CT and symptoms wondering if we can skip it - per HPI  IMPRESSION: 1. There is mild, bibasilar predominant pulmonary fibrosis in a pattern featuring mild tubular bronchiectasis, irregular peripheral interstitial and ground-glass opacity and subtle areas of bronchiolectasis in the lung bases. Fibrotic findings are  not significantly changed compared to prior examination dated 10/19/2017. Findings remain most consistent with a "probable UIP" pattern of fibrosis by ATS pulmonary fibrosis criteria and are potentially consistent with a connective tissue disorder related fibrosis given reported history of scleroderma. Findings are categorized as probable UIP per consensus guidelines: Diagnosis of Idiopathic Pulmonary Fibrosis: An Official ATS/ERS/JRS/ALAT Clinical Practice Guideline. Am Rosezetta Schlatter Crit Care Med Vol 198, Iss 5, [phone], Oct 11 2016. 2. No CT abnormality of the esophagus given reported history of scleroderma. 3. Cholelithiasis.     Electronically Signed   By: Lauralyn Primes M.D.   On: 09/01/2019 13:56   ROS - per HPI  OV 09/20/2020  Subjective:  Patient ID: Lindsay Herman, female , DOB: 06-14-1945 , age 37 y.o. , MRN: [id number] , ADDRESS: [address] [place] [address] PCP Geoffry Paradise, MD Patient Care Team: Geoffry Paradise, MD as PCP - General (Internal Medicine) Tenny Craw,  Sherol Dade, MD as PCP - Cardiology (Cardiology)  This Provider for this visit: Treatment Team:  Attending Provider: Kalman Shan, MD    09/20/2020 -   Chief Complaint  Patient presents with   Follow-up    PFT performed today.  Pt states she has been doing okay since last visit. States she does have some hoarseness first thing in the morning.    interstitial lung disease secondary to scleroderma on Imuran Hx of Colectomy  HPI TONYETTA LOVEGROVE 77 y.o. -returns for her annual follow-up.  She says in the last 1 year respiratory status is stable.  ILD symptom score is 3 in the past it is fluctuated between 1 and 6 so she is in the middle.  There is no cough or any other issue.  However she does have mild hoarseness of voice for the last 1 year and it did get worse.  It is then on and off.  She has never seen ENT.  She is a current non-smoker but a former smoker   She continues on Imuran.  She is  worried about the fact she is immunosuppressed.  She social distances and masks.  Her pulmonary function test shows stability DLCO but her FVC is lower.  She thinks it was a technique issue.  Her walking desaturation test and symptom score is stable.      IMPRESSION: HRCT 1. There is mild, bibasilar predominant pulmonary fibrosis in a pattern featuring mild tubular bronchiectasis, irregular peripheral interstitial and ground-glass opacity and subtle areas of bronchiolectasis in the lung bases. Fibrotic findings are not significantly changed compared to prior examination dated 10/19/2017. Findings remain most consistent with a "probable UIP" pattern of fibrosis by ATS pulmonary fibrosis criteria and are potentially consistent with a connective tissue disorder related fibrosis given reported history of scleroderma. Findings are categorized as probable UIP per consensus guidelines: Diagnosis of Idiopathic Pulmonary Fibrosis: An Official ATS/ERS/JRS/ALAT Clinical Practice Guideline. Am Rosezetta Schlatter Crit Care Med Vol 198, Iss 5, [phone], Oct 11 2016. 2. No CT abnormality of the esophagus given reported history of scleroderma. 3. Cholelithiasis.     Electronically Signed   By: Lauralyn Primes M.D.   On: 09/01/2019 13:56    OV 08/29/2021  Subjective:  Patient ID: Lindsay Herman, female , DOB: 03/20/1945 , age 53 y.o. , MRN: [id number] , ADDRESS: [address] [place] [address] PCP Geoffry Paradise, MD Patient Care Team: Geoffry Paradise, MD as PCP - General (Internal Medicine) Pricilla Riffle, MD as PCP - Cardiology (Cardiology)  This Provider for this visit: Treatment Team:  Attending Provider: Kalman Shan, MD    08/29/2021 -   Chief Complaint  Patient presents with   Follow-up    Patient has no complaints.      HPI CARMALETA ZEIN 77 y.o. -returns for follow-up.  Last seen in August 2022.  Since then she did see Dr Pollyann Savoy on Jun 19, 2021.  Notes from  rheumatology back then noted.  She did say that rheumatology did notice crackles in her lungs.  Patient's feels over the last 1 year she is more short of breath with exertion relieved by rest.  There is no associated chest pain.  There is no cough or wheezing.  No syncope.  She believes this because for the last 3 years because of the pandemic she has not been exercising and every time she tries to go to the [facility] this scared of another respiratory viral circulation in the community.  Therefore she is more sedentary.  She also during the winters used to walk around [employer] but she is not doing that anymore.  She continues to Imuran.  Review of the records indicate last CT scan of the chest is in 2021.  Last echocardiogram 2018.  Social history: Husband had MI 3 years ago and is more sedentary ECOG is 3-4.  He watching TV all the time and eating all the time.  She is frustrated little bit by this.        OV 10/31/2021  Subjective:  Patient ID: Lindsay Herman, female , DOB: Nov 19, 1945 , age 17 y.o. , MRN: [id number] , ADDRESS: [address] [place] [address] PCP Geoffry Paradise, MD Patient Care Team: Geoffry Paradise, MD as PCP - General (Internal Medicine) Pricilla Riffle, MD as PCP - Cardiology (Cardiology) Chilton Si, MD as Attending Physician (Cardiology)  This Provider for this visit: Treatment Team:  Attending Provider: Kalman Shan, MD    10/31/2021 -   Chief Complaint  Patient presents with   Follow-up    Pt is here today to discuss results of recent CT. Pt states she has been doing okay since last visit and denies any complaints.    HPI SHARAE FAGERBERG 77 y.o. -returns for follow-up.  At last visit there was concern ILD was getting worse because of symptom score was worse.  Today she tells me she saw it improved spontaneously.  Symptom score still shows slight improvement nevertheless compared to a year ago it slightly worse.  Her echocardiogram shows  grade 1 diastolic dysfunction.  She actually told me she did not understand fully what scleroderma meant.  She did not understand her thickening skin and the wrinkles on her face are because of scleroderma.  She says she has been on Imuran for a few years.  She is has history of colectomy but there is no diarrhea.  Our radiologist thought the ILD is slightly worse.  I shared this result with her.  She wanted know about therapeutic options.  We discussed nintedanib but did express concern that given her colectomy she would have diarrhea but at baseline she says she never had any diarrhea.  I personally visualized the scan and compared to 2021 and also 2019.  I personally do not think her ILD is worse now compared to the past.  Her echo has grade 1 diastolic dysfunction.  I noticed that she is on Fosamax which can provoke acid reflux particular in scleroderma and then this in turn can make ILD worse.  I think she should stop this.  Overall we recommended close monitoring because of the side effect profile of approved antifibrotic nintedanib.  The other option would be to do Actemra or CellCept.  CellCept also can cause diarrhea.  Actemra is given subcutaneous and would be a viable option for her but I would need to know if she has at least 10% of lung involvement fibrosis.  I put a message out to the radiologist.  She is already on 1 immunomodulators and so adding another immunomodulator could increase the risk of opportunistic infections.  Therefore overall taking a more close monitoring approach.      OV 01/24/2022  Subjective:  Patient ID: Lindsay Herman, female , DOB: 1945/07/28 , age 31 y.o. , MRN: [id number] , ADDRESS: [address] [place] [address] PCP Geoffry Paradise, MD Patient Care Team: Geoffry Paradise, MD as PCP - General (Internal Medicine) Pricilla Riffle, MD as PCP -  Cardiology (Cardiology) Chilton Si, MD as Attending Physician (Cardiology)  This Provider for this  visit: Treatment Team:  Attending Provider: Kalman Shan, MD    01/24/2022 -   Chief Complaint  Patient presents with   Follow-up    PFT performed today.  Pt states she has been doing okay since last visit and denies any complaints.   HPI SULAF FINLEY 77 y.o. -returns for follow-up.  At this visit she actually reports stable symptoms.  Her dyspnea score is actually very low.  I did indicate to her that in the past she had given a high score.  She said that she quite could not understand the questions.  Last visit I realized that she did not have a good grasp of interstitial lung disease and scleroderma and I explained it.  This visit I asked her about scleroderma.  She says that all she notices that she has scleroderma and Dr. Fredrik Cove sees her for this.  She is not fully sure what the implications of scleroderma are.  She is not aware that she has interstitial lung disease although she does realize that she is seeing me for "a lung problem".  Review of the records indicate that she is having short-term memory loss.  She has seen neurology in the spring 2023 and noticed to have mild cognitive impairment.  She was able to recollect details of colon cancer from 1996.  Given the scleroderma last visit I told her to stop her Fosamax.  At that point in time she told me that she was still taking her Fosamax.  I then pointed out to the med list that did not show Fosamax.  Then she did not know what Fosamax was for.  Then she said that she absolutely was not taking Fosamax anymore.  I did inquire about her social status.  She lives with her husband.  Husband's had MI in the past.  She states that he spends his night sleeping in the recliner in the TV room.  She has 2 sons one of them lives in [place].  Another 1 lives in [place].  I did ask if she would be interested in bringing them for a future visit.  She did not feel it was necessary.  She wanted to know the benefit of bringing her children to  her medical visit.  I did indicate to her that it is generally a good idea to have children involved so the full knowledge of the parents health status but again it would be her decision and her right to disclose.  She says she will update her children about her medical conditions.  Reviewed pulmonary function test the DLCO is definitely declining this year.  CT scan of the chest radiologist also felt there was a slight decline.  Therefore I did indicate to her that there is evidence for progression in the interstitial lung disease.  In addition with scleroderma according to Dr. Llana Aliment addendum report there is at least 15% of her lungs involved with fibrosis.  Therefore I told her that she meets criteria to start a second medication which is antifibrotic nintedanib.  She does not have any GI issues other than colectomy.  I did contact the GI doctor through secure chat.  Dr. Marca Ancona did not see any contraindication for nintedanib from a GI standpoint.  I told patient we will start a low-dose protocol.  She is normal liver function and renal function recently.  Some minutes after this I had the research coordinator  give her a consent form for ILD-Pro registry.  By this time she was not fully aware about her interstitial lung disease even though I just discussed this.  So I went back.  She told me she was able to recollect details of interstitial lung disease and our conversation and the decision to start nintedanib and she was fine with it.  She does keep her appointments on time and does follow the instructions.     OV 04/24/2022  Subjective:  Patient ID: Lindsay Herman, female , DOB: 1945/10/25 , age 36 y.o. , MRN: [id number] , ADDRESS: [address] [place] [address] PCP Geoffry Paradise, MD Patient Care Team: Geoffry Paradise, MD as PCP - General (Internal Medicine) Pricilla Riffle, MD as PCP - Cardiology (Cardiology) Chilton Si, MD as Attending Physician (Cardiology)  This  Provider for this visit: Treatment Team:  Attending Provider: Kalman Shan, MD    04/24/2022 -   Chief Complaint  Patient presents with   Follow-up    Pft review,      HPI BRUNELL PEASLEY 77 y.o. -returns for follow-up.  I saw her in December 2023 and recommended nintedanib.  She started nintedanib on 03/10/2022.  I reviewed the pharmacist note.  The pharmacist also indicated she has a 22-month supply at home.  She tells me that she is taking her nintedanib low-dose protocol 100 mg twice daily on schedule and very compliant.  She is not having any diarrhea or GI side effects.  However she is not sure if she has supply at home.  She showed me nearly empty bottle.  I referred the issue to the pharmacist.  Otherwise she is feeling well.  We also do the symptom questionnaire which she is struggled to understand the questions and do it.  [She does have memory loss].  She did remember that she is now stopped the Fosamax altogether.  She wanted know the rationale.  I did explain to her that it is to prevent any potential silent acid reflux which would be a risk factor for ILD getting worse.  She did agree that she was continuing her Imuran.  Her last liver function test was in April 01, 2022 I reviewed the lab result and it is normal.  Pulmonary function test today.  Shows stability compared to recent PFT.     OV 07/22/2022  Subjective:  Patient ID: Lindsay Herman, female , DOB: January 18, 1946 , age 29 y.o. , MRN: [id number] , ADDRESS: [address] [place] [address] PCP Geoffry Paradise, MD Patient Care Team: Geoffry Paradise, MD as PCP - General (Internal Medicine) Pricilla Riffle, MD as PCP - Cardiology (Cardiology) Chilton Si, MD as Attending Physician (Cardiology)  This Provider for this visit: Treatment Team:  Attending Provider: Kalman Shan, MD  07/22/2022 -   Chief Complaint  Patient presents with   Follow-up    Annual f/up no complaints      HPI Lindsay Herman 77 y.o. -presents for follow-up.  At this point she does not endorse any new complaints.  She feels stable although the symptom score seems to worse.  We did a sit/stand hypoxemia test and it is stable.  She says she is tolerating her low-dose nintedanib quite well.  She has mild memory impairment but at this visit she had good recall.  She did tell that her husband who lives with has had an MI in 2017 and since then his quality of life has been "downhill".  His  ECOG is 3.  He is very sedentary.  Is eating a lot.  She has to give him his medications and injections.  She says she is compliant with her Imuran.  She is also taking nintedanib low-dose protocol and tolerating it well.   Nintedanib is a high risk medication under my care::Nintedanib/Ofev requires intensive drug monitoring due to high concerns for Adverse effects of , including  Drug Induced Liver Injury, significant GI side effects that include but not limited to Diarrhea, Nausea, Vomiting,  and other system side effects that include Fatigue,  weight loss. Cardiac side effects are a black box warning as well. These will be monitored with  blood work such as LFT initially once a month for 6 months and then quarterly  Other issue: She stopped her alendronate earlier this year based on advice.  This because scleroderma patients of acid reflux and alendronate increases the risk for acid reflux and acid reflux increases the risk for ILD.  I communicated this with her but but she said her primary care physician was wondering if she brought a note with her.  Have called Dr. Jacky Kindle and left a message for him or his staff to call me back so I can communicate this message directly.->  Fleet Contras from his office called back 10:40 AM and I explained to her.  Directly on the phone  LFT normal 06/27/22 LastHRCt Aug 2023 Lat PFt Marh 2024 Las[facility] Echo 09/10/21       OV 01/26/2023  Subjective:  Patient ID: Lindsay Herman, female , DOB: December 24, 1945 ,  age 83 y.o. , MRN: [id number] , ADDRESS: [address] [place] [address] PCP Geoffry Paradise, MD Patient Care Team: Geoffry Paradise, MD as PCP - General (Internal Medicine) Pricilla Riffle, MD as PCP - Cardiology (Cardiology) Chilton Si, MD as Attending Physician (Cardiology)  This Provider for this visit: Treatment Team:  Attending Provider: Kalman Shan, MD     interstitial lung disease secondary to scleroderma on Imuran and OFev High risk medication use  -Last high-resolution CT chest July 2021 described as mild probable UIP pattern. -> Aug 2023 with minimal progession  - 15-20% of involvement of fibrosis latest CT Aug 2023  -Started low-dose nintedanib protocol December 2023 Hx of Colectomy due to colon cancer  -Per Dr. Marca Ancona December 2023: colonoscopy was in 01/01/22 , a large polyp was removed, she has history of colectomy in 1996 ( sigmoid).  History of acid reflux disease History of osteopenia on Fosamax   -Advised to talk to the primary care physician and stop it in September 2023 -> she says she stopped it feb 2024 Grade 1 diastolic dysfunction on echo summer 2023 Mild cognitive impairment  -Sees neurology: Spring 20[facility] cognitive score 20  Voice hoaresnews - new complaint 01/26/2023  High risk medication use  -Imuran through rheumatology   -Nintedanib through pulmonary: Nintedanib/Ofev requires intensive drug monitoring due to high concerns for Adverse effects of , including  Drug Induced Liver Injury, significant GI side effects that include but not limited to Diarrhea, Nausea, Vomiting,  and other system side effects that include Fatigue,  weight loss. Cardiac side effects are a black box warning as well. These will be monitored with  blood work such as LFT initially once a month for 6 months and then quarterly    01/26/2023 -   Chief Complaint  Patient presents with   Follow-up    PFT f/u     HPI ADRYANNA STRIDER 77 y.o.  -  presents for routine follow-up.  At this point she is continuing Imuran and nintedanib.  She presents by herself.  Today her memory is actually pretty good.  She tells me that she is not ready for Christmas and compared to previous year she is not doing any decorating.  This because 1 son lives in [place] and another son lives in [place] and she and her husband are by themselves for Christmas.  She feels stable.  The only complaint she had was really that early in the morning when she gets up she has a hoarseness with her voice and she feels her throat to be raspy it has been going on for some years and never seen ENT in many years.  Its present early in the morning gets worse by evening.  I did indicate to her she has scleroderma and she has acid reflux and this could be causing some vocal cord issues but also indicated to her that she might need to see ENT to rule out any abnormal pathology.  She is willing to do that.  In terms of tolerating her immunosuppressive's and also nintedanib she has no diarrhea and she feels good.  No nausea no vomiting no diarrhea.  She had pulmonary function test today and is stable.  Her exercise hypoxemia test also stable.   SYMPTOM SCALE - ILD 10/27/2018  09/08/2019  09/20/2020  08/29/2021  10/31/2021  01/24/2022  04/24/2022 Sruglled to process the questions 07/22/2022  01/26/2023   O2 use ra ra ra ra ra ra ra ra   Shortness of Breath symptoms 0 -> 5 scale with 5 being worst (score 6 If unable to do) 0         At rest 0 0 0 0 1 0 0 0   Simple tasks - showers, clothes change, eating, shaving 0 0 0 0 1 00 0 1   Household (dishes, doing bed, laundry) 1 0 1 2 1  0 0 2   Shopping 1 0 0 2 1 0 0 3   Walking level at own pace 1 0 1 2 1 1  0 2   Walking up Stairs 3 1 1 3 2 1  0 3   Total (40 - 48) Dyspnea Score 6 1 3 9 7 2  0 11   How bad is your cough? 0 0 0 0 0 0 0 0   How bad is your fatigue 3 1 0 3.5 1 0 0 2   nausea  0 0 0 0 0 0 0   vomit  0 0 0 0 0 0 0    diarrhea  0 0 0 0 0 0 0   anzity  0 0 1 0 0 2 due to sick hub 0   depression  0 0 0 0 0 0 0   0  Simple office walk 185 feet x  3 laps goal with forehead probe 09/08/2019 Done with mask 09/20/2020  08/29/2021  01/24/2022  07/22/22  01/26/2023   O2 used ra ra ra ra ra ra  Number laps completed 3 3 3 3  Sist stand x 12 Sist stand x 15  Comments about pace moderate brisk fast avg    Resting Pulse Ox/HR 96% and 91/min 100% and 91% 98% and HR 100 99% and HR 72 98% and HR 79 97% and 76  Final Pulse Ox/HR 93% and 116/min 99% and 121 98% and HR 104 97% and HR 125 96% and HR 96 97% and HR 130  Desaturated </=  88% no no no no    Desaturated <= 3% points yes no no no    Got Tachycardic >/= 90/min yes yes yes yes    Symptoms at end of test Moderate dyspnea due to mask Mild dyspnea No dyspnea Mild dyspnea Level 2 dyspnea   Miscellaneous comments x   x     PFT     Latest Ref Rng & Units 01/26/2023    1:18 PM 04/24/2022    1:58 PM 01/24/2022    8:38 AM 08/29/2021    1:54 PM 09/20/2020   12:02 PM 10/27/2018    8:53 AM 11/10/2017    4:02 PM  ILD indicators  FVC-Pre L 3.05  P 3.08  3.05  3.03  2.95  3.28  3.22   FVC-Predicted Pre % 102  P 99  97  96  92  100  98   FVC-Post L       3.23   FVC-Predicted Post %       98   TLC L       5.04   TLC Predicted %       91   DLCO uncorrected ml/min/mmHg 19.17  P 22.63  14.97  15.61  21.14  19.71  18.84   DLCO UNC %Pred % 94  P 107  71  74  100  92  66   DLCO Corrected ml/min/mmHg 19.05  P 22.50  14.97  15.33  20.95     DLCO COR %Pred % 93  P 107  71  72  99       P Preliminary result      LAB RESULTS last 96 hours No results found.  LAB RESULTS last 90 days Recent Results (from the past 2160 hours)  VITAMIN D 25 Hydroxy (Vit-D Deficiency, Fractures)     Status: None   Collection Time: 11/24/22  1:41 PM  Result Value Ref Range   Vit D, 25-Hydroxy 38 30 - 100 ng/mL    Comment: Vitamin D Status         25-OH Vitamin D: . Deficiency:                     <20 ng/mL Insufficiency:             20 - 29 ng/mL Optimal:                 > or = 30 ng/mL . For 25-OH Vitamin D testing on patients on  D2-supplementation and patients for whom quantitation  of D2 and D3 fractions is required, the [facility](TM) 25-OH VIT D, (D2,D3), LC/MS/MS is recommended: order  code 91478 (patients >98yrs). . See Note 1 . Note 1 . For additional information, please refer to  http://education.[facility].com/faq/FAQ199  (This link is being provided for informational/ educational purposes only.)   COMPLETE METABOLIC PANEL WITH GFR     Status: Abnormal   Collection Time: 11/24/22  1:41 PM  Result Value Ref Range   Glucose, Bld 110 (H) 65 - 99 mg/dL    Comment: .            Fasting reference interval . For someone without known diabetes, a glucose value between 100 and 125 mg/dL is consistent with prediabetes and should be confirmed with a follow-up test. .    BUN 12 7 - 25 mg/dL   Creat 2.95 6.21 - 3.08 mg/dL   eGFR 75 > OR = 60 MV/HQI/6.96E9   BUN/Creatinine Ratio  SEE NOTE: 6 - 22 (calc)    Comment:    Not Reported: BUN and Creatinine are within    reference range. .    Sodium 140 135 - 146 mmol/L   Potassium 4.7 3.5 - 5.3 mmol/L   Chloride 103 98 - 110 mmol/L   CO2 27 20 - 32 mmol/L   Calcium 9.8 8.6 - 10.4 mg/dL   Total Protein 6.8 6.1 - 8.1 g/dL   Albumin 4.3 3.6 - 5.1 g/dL   Globulin 2.5 1.9 - 3.7 g/dL (calc)   AG Ratio 1.7 1.0 - 2.5 (calc)   Total Bilirubin 0.5 0.2 - 1.2 mg/dL   Alkaline phosphatase (APISO) 54 37 - 153 U/L   AST 18 10 - 35 U/L   ALT 19 6 - 29 U/L  CBC with Differential/Platelet     Status: Abnormal   Collection Time: 11/24/22  1:41 PM  Result Value Ref Range   WBC 6.4 3.8 - 10.8 Thousand/uL   RBC 4.64 3.80 - 5.10 Million/uL   Hemoglobin 14.5 11.7 - 15.5 g/dL   HCT 09.8 (H) 11.9 - 14.7 %   MCV 98.1 80.0 - 100.0 fL   MCH 31.3 27.0 - 33.0 pg   MCHC 31.9 (L) 32.0 - 36.0 g/dL    Comment: For adults, a  slight decrease in the calculated MCHC value (in the range of 30 to 32 g/dL) is most likely not clinically significant; however, it should be interpreted with caution in correlation with other red cell parameters and the patient's clinical condition.    RDW 12.8 11.0 - 15.0 %   Platelets 346 140 - 400 Thousand/uL   MPV 11.1 7.5 - 12.5 fL   Neutro Abs 4,461 1,500 - 7,800 cells/uL   Lymphs Abs 954 850 - 3,900 cells/uL   Absolute Monocytes 896 200 - 950 cells/uL   Eosinophils Absolute 51 15 - 500 cells/uL   Basophils Absolute 38 0 - 200 cells/uL   Neutrophils Relative % 69.7 %   Total Lymphocyte 14.9 %   Monocytes Relative 14.0 %   Eosinophils Relative 0.8 %   Basophils Relative 0.6 %  Pulmonary function test     Status: None (Preliminary result)   Collection Time: 01/26/23  1:18 PM  Result Value Ref Range   FVC-Pre 3.05 L   FVC-%Pred-Pre 102 %   FEV1-Pre 2.64 L   FEV1-%Pred-Pre 118 %   FEV6-Pre 3.04 L   FEV6-%Pred-Pre 107 %   Pre FEV1/FVC ratio 87 %   FEV1FVC-%Pred-Pre 116 %   Pre FEV6/FVC Ratio 100 %   FEV6FVC-%Pred-Pre 105 %   FEF 25-75 Pre 3.64 L/sec   FEF2575-%Pred-Pre 221 %   DLCO unc 19.17 ml/min/mmHg   DLCO unc % pred 94 %   DLCO cor 19.05 ml/min/mmHg   DLCO cor % pred 93 %   DL/VA 8.29 ml/min/mmHg/L   DL/VA % pred 562 %         has a past medical history of Atherosclerosis, Atrophic vaginitis, Cancer (HCC) (1995), Contact lens/glasses fitting, Cystocele, Nasal fracture, Osteopenia (06/2017), PONV (postoperative nausea and vomiting), Raynaud's disease, Rectocele, Rosacea, Scleroderma (HCC), and Urinary frequency.   reports that she quit smoking about 50 years ago. Her smoking use included cigarettes. She started smoking about 59 years ago. She has a 4.8 pack-year smoking history. She has been exposed to tobacco smoke. She has never used smokeless tobacco.  Past Surgical History:  Procedure Laterality Date   APPENDECTOMY     colon cancer  1995   COLON  RESECTION  1995   KNEE ARTHROSCOPY Left 01/14/2019   Procedure: ARTHROSCOPY KNEE, PARTIAL MENISCECTOMY, CHONDROPLASTY AND LOOSE BODY REMOVAL;  Surgeon: Jodi Geralds, MD;  Location: [facility];  Service: Orthopedics;  Laterality: Left;   MASS EXCISION Left 08/31/2012   Procedure: EXCISION MUCOID CYST DEBRIDEMENT DISTAL INTERPHALANGEAL LEFT MIDDLE FINGER;  Surgeon: Nicki Reaper, MD;  Location: [facility];  Service: Orthopedics;  Laterality: Left;   OOPHORECTOMY     LSO   ROTATOR CUFF REPAIR Right    TONSILLECTOMY     VAGINAL HYSTERECTOMY  1994    Allergies  Allergen Reactions   Codeine Nausea And Vomiting   Morphine Other (See Comments)    Other reaction(s): Unknown   Morphine And Codeine Nausea And Vomiting    Immunization History  Administered Date(s) Administered   Influenza Split 12/12/2010   Influenza, High Dose Seasonal PF 10/24/2017, 11/08/2018   PFIZER(Purple Top)SARS-COV-2 Vaccination 03/18/2019, 04/12/2019, 09/29/2019, 03/27/2020   Pneumococcal Conjugate-13 07/24/2014   Pneumococcal Polysaccharide-23 01/31/2009   Zoster Recombinant(Shingrix) 11/27/2017, 02/12/2018    Family History  Problem Relation Age of Onset   Diabetes Mother    Heart disease Mother    Stroke Mother    Diabetes Brother    Hypertension Brother    Lupus Father      Current Outpatient Medications:    aspirin EC 81 MG tablet, Take 1 tablet (81 mg total) by mouth daily. Swallow whole., Disp: 100 tablet, Rfl: 3   azaTHIOprine (IMURAN) 50 MG tablet, TAKE 1 TABLET(50 MG) BY MOUTH DAILY, Disp: 90 tablet, Rfl: 0   Coenzyme Q10 (COQ10) 200 MG CAPS, Take by mouth daily., Disp: , Rfl:    ezetimibe (ZETIA) 10 MG tablet, Take 1 tablet (10 mg total) by mouth daily., Disp: 90 tablet, Rfl: 3   levothyroxine (SYNTHROID) 50 MCG tablet, Take 1 tablet by mouth every evening. , Disp: , Rfl:    MAGNESIUM CITRATE PO, Take 250 mg by mouth daily., Disp: , Rfl:    Multiple  Vitamins-Minerals (PRESERVISION AREDS 2) CAPS, Take 1 capsule by mouth 2 (two) times daily., Disp: , Rfl:    Nintedanib (OFEV) 100 MG CAPS, Take 1 capsule (100 mg total) by mouth 2 (two) times daily., Disp: 180 capsule, Rfl: 1   rosuvastatin (CRESTOR) 40 MG tablet, Take 1 tablet (40 mg total) by mouth daily., Disp: 90 tablet, Rfl: 3   TURMERIC PO, Take by mouth 2 (two) times daily., Disp: , Rfl:    Vitamin D, Cholecalciferol, 25 MCG (1000 UT) CAPS, Take 2,000 Units by mouth daily., Disp: , Rfl:    Vitamin D, Ergocalciferol, (DRISDOL) 1.25 MG (50000 UNIT) CAPS capsule, Take 1 capsule (50,000 Units total) by mouth every 7 (seven) days., Disp: 12 capsule, Rfl: 0      Objective:   Vitals:   01/26/23 1424  BP: (!) 143/80  Pulse: 80  SpO2: 98%  Weight: 161 lb 6.4 oz (73.2 kg)  Height: 5' 6.25" (1.683 m)    Estimated body mass index is 25.85 kg/m as calculated from the following:   Height as of this encounter: 5' 6.25" (1.683 m).   Weight as of this encounter: 161 lb 6.4 oz (73.2 kg).  @WEIGHTCHANGE @  Filed Weights   01/26/23 1424  Weight: 161 lb 6.4 oz (73.2 kg)     Physical Exam   General: No distress. Looks well O2 at rest: no Cane present: no Sitting in wheel chair: no Frail: no  Obese: no Neuro: Alert and Oriented x 3. GCS 15. Speech normal Psych: Pleasant Resp:  Barrel Chest - no.  Wheeze - no, Crackles - no, No overt respiratory distress CVS: Normal heart sounds. Murmurs - no Ext: Stigmata of Connective Tissue Disease - SCLERODERMA HEENT: Normal upper airway. PEERL +. No post nasal drip        Assessment:       ICD-10-CM   1. Interstitial lung disease due to connective tissue disease (HCC)  J84.89 Hepatic function panel   M35.9 Ambulatory referral to ENT    2. Scleroderma (HCC)  M34.9 Hepatic function panel    Ambulatory referral to ENT    3. Encounter for therapeutic drug monitoring  Z51.81 Hepatic function panel    Ambulatory referral to ENT    4. High  risk medication use  Z79.899 Hepatic function panel    Ambulatory referral to ENT    5. Hoarseness of voice  R49.0 Hepatic function panel    Ambulatory referral to ENT         Plan:     Patient Instructions     ICD-10-CM   1. Interstitial lung disease due to connective tissue disease (HCC)  J84.89    M35.9     2. Scleroderma (HCC)  M34.9     3. Encounter for therapeutic drug monitoring  Z51.81     4. High risk medication use  Z79.899     5. Hoarseness of voice  R49.0          #ILD  Interstitial lung disease secondary to scleroderma.- mildly and slwly progressive over time but stable since last visit On Ofev since Mar 10, 2022 and toearing it well  LFT normal Oct 2024   Plan -Continue Imuran that was prescribed by Dr. Algis Downs for your joint pain - Cotninue ofev low dose protocol at 100mg  twice daily    - rehceck LFT 01/26/2023 - do spirometry and dlco in 6 months  #VOice hoarseness   - Scleroderma patients do have silent acid reflux. And this can be caussing voice change  Plan - refer ENT  Followup -6 months - 15 min visit with Dr Marchelle Gearing   - walk and ILD symptoms score at followup   FOLLOWUP Return for 15 min visit.    SIGNATURE    Dr. Kalman Shan, M.D., F.C.C.P,  Pulmonary and Critical Care Medicine Staff Physician, [facility] Director - Interstitial Lung Disease  Program  Pulmonary Fibrosis [facility] [place], [place], [address]  Pager: [phone], If no answer or between  15:00h - 7:00h: call [phone] Telephone: [phone]  2:49 PM 01/26/2023

## 2023-02-04 NOTE — Progress Notes (Signed)
 Office Visit Note  Patient: Lindsay Herman             Date of Birth: October 29, 1945           MRN: [id number]             PCP: Shepard Ade, MD Referring: Shepard Ade, MD Visit Date: 02/17/2023 Occupation: @GUAROCC @  Subjective:  Pain of the Left middle finger   History of Present Illness: Lindsay Herman is a 77 y.o. female with limited systemic sclerosis and ILD.  She states she has intermittent discomfort in her hands.  She has been having some discomfort in her left middle finger.  She denies any increased shortness of breath or palpitations.  She states she has been having hoarseness of her voice for which Dr. Tonna referred her to an ENT.  She denies any increased reflux symptoms.  She denies dry mouth and dry eyes. Patient was evaluated by Dr. Geronimo on January 26, 2023.  He noted mildly and slowly progressive ILD which is stable according to his office visit note.  She has been on Ofev  100 mg p.o. twice daily since March 10, 2022.  He recommended to continue Imuran .  Patient will get PFTs every 6 months.  He also referred patient to ENT for voice hoarseness. Patient was evaluated by Dr. Vina Gull in November 2023.  Her last echocardiogram was in August 2023.  Patient was advised to follow-up in 1 year.  Activities of Daily Living:  Patient reports morning stiffness for 0  hour.   Patient Denies nocturnal pain.  Difficulty dressing/grooming: Denies Difficulty climbing stairs: Denies Difficulty getting out of chair: Denies Difficulty using hands for taps, buttons, cutlery, and/or writing: Denies  Review of Systems  Constitutional:  Positive for fatigue.  HENT: Negative.  Negative for mouth sores and mouth dryness.   Eyes: Negative.  Negative for dryness.  Respiratory: Negative.  Negative for shortness of breath.   Cardiovascular:  Negative for chest pain and palpitations.  Gastrointestinal: Negative.  Negative for blood in stool, constipation and diarrhea.   Endocrine: Negative.  Negative for increased urination.  Genitourinary: Negative.  Negative for involuntary urination.  Musculoskeletal:  Positive for joint pain, joint pain and joint swelling. Negative for gait problem, myalgias, muscle weakness, morning stiffness, muscle tenderness and myalgias.  Skin:  Positive for color change. Negative for rash, hair loss and sensitivity to sunlight.  Allergic/Immunologic: Negative.  Negative for susceptible to infections.  Neurological: Negative.  Negative for dizziness and headaches.  Hematological:  Negative for swollen glands.  Psychiatric/Behavioral: Negative.  Negative for depressed mood and sleep disturbance. The patient is not nervous/anxious.     PMFS History:  Patient Active Problem List   Diagnosis Date Noted   Mild cognitive impairment 06/30/2021   Acute medial meniscal tear, left, initial encounter 01/14/2019   Chondromalacia, left knee 01/14/2019   Loose body of left knee 01/14/2019   Scleroderma, limited (HCC) 02/10/2016   History of environmental allergies 02/10/2016   Gastroesophageal reflux disease 02/10/2016   History of colon cancer 02/10/2016   Sicca syndrome 02/01/2016   History of repair of right rotator cuff 02/01/2016   Primary osteoarthritis of both knees 02/01/2016   Primary osteoarthritis of both hands 02/01/2016   Raynaud's disease without gangrene 02/01/2016   Cystocele    Atrophic vaginitis    Osteopenia    Menopausal symptoms     Past Medical History:  Diagnosis Date   Atherosclerosis    aorta and coronary  artery   Atrophic vaginitis    Cancer (HCC) 1995   Colon cancer tx surgery and chemo   Contact lens/glasses fitting    wears contacts or glasses   Cystocele    Nasal fracture    Osteopenia 06/2017   T score -2.0 FRAX 13% / 2.8%   PONV (postoperative nausea and vomiting)    Raynaud's disease    Rectocele    Rosacea    Scleroderma (HCC)    Urinary frequency     Family History  Problem Relation  Age of Onset   Diabetes Mother    Heart disease Mother    Stroke Mother    Diabetes Brother    Hypertension Brother    Lupus Father    Past Surgical History:  Procedure Laterality Date   APPENDECTOMY     colon cancer  1995   COLON RESECTION  1995   KNEE ARTHROSCOPY Left 01/14/2019   Procedure: ARTHROSCOPY KNEE, PARTIAL MENISCECTOMY, CHONDROPLASTY AND LOOSE BODY REMOVAL;  Surgeon: Yvone Rush, MD;  Location: [facility];  Service: Orthopedics;  Laterality: Left;   MASS EXCISION Left 08/31/2012   Procedure: EXCISION MUCOID CYST DEBRIDEMENT DISTAL INTERPHALANGEAL LEFT MIDDLE FINGER;  Surgeon: Arley JONELLE Curia, MD;  Location: [facility];  Service: Orthopedics;  Laterality: Left;   OOPHORECTOMY     LSO   ROTATOR CUFF REPAIR Right    TONSILLECTOMY     VAGINAL HYSTERECTOMY  1994   Social History   Social History Narrative   Right handed   Drinks caffeine   Two story home   Immunization History  Administered Date(s) Administered   Influenza Split 12/12/2010   Influenza, High Dose Seasonal PF 10/24/2017, 11/08/2018   PFIZER(Purple Top)SARS-COV-2 Vaccination 03/18/2019, 04/12/2019, 09/29/2019, 03/27/2020   Pneumococcal Conjugate-13 07/24/2014   Pneumococcal Polysaccharide-23 01/31/2009   Zoster Recombinant(Shingrix) 11/27/2017, 02/12/2018     Objective: Vital Signs: BP 125/81   Pulse 79   Resp 16   Ht 5' 6.25 (1.683 m)   Wt 160 lb (72.6 kg)   BMI 25.63 kg/m    Physical Exam Vitals and nursing note reviewed.  Constitutional:      Appearance: She is well-developed.  HENT:     Head: Normocephalic and atraumatic.  Eyes:     Conjunctiva/sclera: Conjunctivae normal.  Cardiovascular:     Rate and Rhythm: Normal rate and regular rhythm.     Heart sounds: Normal heart sounds.  Pulmonary:     Effort: Pulmonary effort is normal.     Breath sounds: Normal breath sounds.     Comments: Crackles were audible in the lung bases. Abdominal:     General:  Bowel sounds are normal.     Palpations: Abdomen is soft.  Musculoskeletal:     Cervical back: Normal range of motion.  Lymphadenopathy:     Cervical: No cervical adenopathy.  Skin:    General: Skin is warm and dry.     Capillary Refill: Capillary refill takes 2 to 3 seconds.     Comments: Sclerodactyly was noted.  Telangiectasias were noted on the right palm and her neck.  Nailbed capillary changes were noted.  Neurological:     Mental Status: She is alert and oriented to person, place, and time.  Psychiatric:        Behavior: Behavior normal.      Musculoskeletal Exam: She had limited lateral rotation of the cervical spine.  Shoulders, elbows, wrists, MCPs 1 good range of motion.  She had bilateral  PIP and DIP thickening with no synovitis.  Hip joints and knee joints in good range of motion.  No warmth swelling or effusion was noted.  There was no tenderness over ankles or MTPs.  CDAI Exam: CDAI Score: -- Patient Global: --; Provider Global: -- Swollen: --; Tender: -- Joint Exam 02/17/2023   No joint exam has been documented for this visit   There is currently no information documented on the homunculus. Go to the Rheumatology activity and complete the homunculus joint exam.  Investigation: No additional findings.  Imaging: No results found.  Recent Labs: Lab Results  Component Value Date   WBC 6.4 11/24/2022   HGB 14.5 11/24/2022   PLT 346 11/24/2022   NA 140 11/24/2022   K 4.7 11/24/2022   CL 103 11/24/2022   CO2 27 11/24/2022   GLUCOSE 110 (H) 11/24/2022   BUN 12 11/24/2022   CREATININE 0.81 11/24/2022   BILITOT 0.4 01/26/2023   ALKPHOS 56 01/26/2023   AST 17 01/26/2023   ALT 16 01/26/2023   PROT 6.9 01/26/2023   ALBUMIN 4.2 01/26/2023   CALCIUM  9.8 11/24/2022   GFRAA 97 06/14/2020   QFTBGOLDPLUS NEGATIVE 10/06/2017    Speciality Comments: No specialty comments available.  Procedures:  No procedures performed Allergies: Codeine, Morphine, and  Morphine and codeine   Assessment / Plan:     Visit Diagnoses: Scleroderma, limited (HCC) - Hx sclerodactyly, Raynauds, arthralgias, ILD, +ANA, +Ro, +La, -RF. -Patient denies any increased skin tightness.  She states Raynauds symptoms are manageable.  There is no history of digital ulcers.  She has no increased shortness of breath or palpitations.  She was recently evaluated by Dr. Geronimo.  He advised to continue on current treatment.  Plan: Sjogrens syndrome-A extractable nuclear antibody  Chronic inflammatory arthritis - RF-, CCP-, 14-3-3 eta negative, R wrist synovitis, erosive changes, intercarpal and radiocarpal joint space narrowing.  No inflammation was noted on the examination today.  She has osteoarthritis with PIP and DIP thickening.  Joint protection was discussed.  Interstitial lung disease (HCC) - Followed by Dr. Geronimo.  Patient was evaluated by him in December.  He recommended continuing Ofev  and Imuran .  High risk medication use - Imuran  50 mg po daily. Off PLQ due to Macular Degenration Concern.  Ofev  100 mg p.o. twice daily started on March 10, 2022. -Labs from November 24, 2022 CBC and CMP were normal.  Will get labs today and every 3 months to monitor for drug toxicity.  Plan: CBC with Differential/Platelet, COMPLETE METABOLIC PANEL WITH GFR.  Information on immunization was placed in the AVS.  Raynaud's disease without gangrene-keeping core temperature warm clothing was discussed.  TPMT intermediate metabolizer (HCC)  History of repair of right rotator cuff - History of intermittent discomfort.  She had good range of motion today.  Primary osteoarthritis of both hands-she had been lateral PIP and DIP thickening.  Primary osteoarthritis of both knees-no warmth or effusion was noted.  She had no discomfort on examination today.  S/P arthroscopy of right knee  Osteopenia of multiple sites - Jun 14, 2021 DEXA scan T-score -2.3, BMD 0.593 left femoral neck no  comparison.reclast  5 mg IV 08/25/22 by GYN Dr. Kandyce. Ttd with fosamax in the past.  Hoarseness of voice-patient recent reports recent hoarseness of voice.  She was referred to ENT by Dr. Tonna.  She denies any dry mouth and dry eyes.  She had good salivary pool.  She denies reflux symptoms.  History of gastroesophageal reflux (GERD)-currently  symptomatic per patient.  History of colon cancer  Memory loss  Vitamin D  deficiency -she had vitamin D  deficiency.  Her vitamin D  was normal in October 2024.  Patient states she stopped vitamin D  again.  Will check vitamin D  level.  I advised her to resume vitamin D  2000 units daily.  Plan: VITAMIN D  25 Hydroxy (Vit-D Deficiency, Fractures)  Orders: Orders Placed This Encounter  Procedures   CBC with Differential/Platelet   COMPLETE METABOLIC PANEL WITH GFR   VITAMIN D  25 Hydroxy (Vit-D Deficiency, Fractures)   Sjogrens syndrome-A extractable nuclear antibody   No orders of the defined types were placed in this encounter.    Follow-Up Instructions: Return in about 5 months (around 07/18/2023) for Scleroderma.   Maya Nash, MD  Note - This record has been created using Animal nutritionist.  Chart creation errors have been sought, but may not always  have been located. Such creation errors do not reflect on  the standard of medical care.

## 2023-02-17 ENCOUNTER — Encounter: Payer: Self-pay | Admitting: Rheumatology

## 2023-02-17 ENCOUNTER — Ambulatory Visit: Payer: Medicare PPO | Attending: Rheumatology | Admitting: Rheumatology

## 2023-02-17 VITALS — BP 125/81 | HR 79 | Resp 16 | Ht 66.25 in | Wt 160.0 lb

## 2023-02-17 DIAGNOSIS — M1380 Other specified arthritis, unspecified site: Secondary | ICD-10-CM

## 2023-02-17 DIAGNOSIS — R413 Other amnesia: Secondary | ICD-10-CM

## 2023-02-17 DIAGNOSIS — Z9889 Other specified postprocedural states: Secondary | ICD-10-CM | POA: Diagnosis not present

## 2023-02-17 DIAGNOSIS — Z79899 Other long term (current) drug therapy: Secondary | ICD-10-CM | POA: Diagnosis not present

## 2023-02-17 DIAGNOSIS — M19041 Primary osteoarthritis, right hand: Secondary | ICD-10-CM | POA: Diagnosis not present

## 2023-02-17 DIAGNOSIS — R490 Dysphonia: Secondary | ICD-10-CM

## 2023-02-17 DIAGNOSIS — M1990 Unspecified osteoarthritis, unspecified site: Secondary | ICD-10-CM

## 2023-02-17 DIAGNOSIS — E8889 Other specified metabolic disorders: Secondary | ICD-10-CM | POA: Diagnosis not present

## 2023-02-17 DIAGNOSIS — M170 Bilateral primary osteoarthritis of knee: Secondary | ICD-10-CM | POA: Diagnosis not present

## 2023-02-17 DIAGNOSIS — Z8719 Personal history of other diseases of the digestive system: Secondary | ICD-10-CM

## 2023-02-17 DIAGNOSIS — I7300 Raynaud's syndrome without gangrene: Secondary | ICD-10-CM

## 2023-02-17 DIAGNOSIS — M8589 Other specified disorders of bone density and structure, multiple sites: Secondary | ICD-10-CM

## 2023-02-17 DIAGNOSIS — M349 Systemic sclerosis, unspecified: Secondary | ICD-10-CM | POA: Diagnosis not present

## 2023-02-17 DIAGNOSIS — E559 Vitamin D deficiency, unspecified: Secondary | ICD-10-CM | POA: Diagnosis not present

## 2023-02-17 DIAGNOSIS — J849 Interstitial pulmonary disease, unspecified: Secondary | ICD-10-CM

## 2023-02-17 DIAGNOSIS — M19042 Primary osteoarthritis, left hand: Secondary | ICD-10-CM

## 2023-02-17 DIAGNOSIS — Z85038 Personal history of other malignant neoplasm of large intestine: Secondary | ICD-10-CM

## 2023-02-17 NOTE — Patient Instructions (Signed)
 Standing Labs We placed an order today for your standing lab work.   Please have your standing labs drawn in April and every 3 months  Please have your labs drawn 2 weeks prior to your appointment so that the provider can discuss your lab results at your appointment, if possible.  Please note that you may see your imaging and lab results in MyChart before we have reviewed them. We will contact you once all results are reviewed. Please allow our office up to 72 hours to thoroughly review all of the results before contacting the office for clarification of your results.  WALK-IN LAB HOURS  Monday through Thursday from 8:00 am -12:30 pm and 1:00 pm-5:00 pm and Friday from 8:00 am-12:00 pm.  Patients with office visits requiring labs will be seen before walk-in labs.  You may encounter longer than normal wait times. Please allow additional time. Wait times may be shorter on  Monday and Thursday afternoons.  We do not book appointments for walk-in labs. We appreciate your patience and understanding with our staff.   Labs are drawn by [facility]. Please bring your co-pay at the time of your lab draw.  You may receive a bill from [facility] for your lab work.  Please note if you are on Hydroxychloroquine and and an order has been placed for a Hydroxychloroquine level,  you will need to have it drawn 4 hours or more after your last dose.  If you wish to have your labs drawn at another location, please call the office 24 hours in advance so we can fax the orders.  The office is located at [address], [place], [place] [address]   If you have any questions regarding directions or hours of operation,  please call [phone].   As a reminder, please drink plenty of water prior to coming for your lab work. Thanks!   Vaccines You are taking a medication(s) that can suppress your immune system.  The following immunizations are recommended: Flu annually Covid-19  Td/Tdap (tetanus,  diphtheria, pertussis) every 10 years Pneumonia (Prevnar 15 then Pneumovax 23 at least 1 year apart.  Alternatively, can take Prevnar 20 without needing additional dose) Shingrix: 2 doses from 4 weeks to 6 months apart  Please check with your PCP to make sure you are up to date.

## 2023-02-18 LAB — CBC WITH DIFFERENTIAL/PLATELET
Absolute Lymphocytes: 843 {cells}/uL — ABNORMAL LOW (ref 850–3900)
Absolute Monocytes: 869 {cells}/uL (ref 200–950)
Basophils Absolute: 26 {cells}/uL (ref 0–200)
Basophils Relative: 0.3 %
Eosinophils Absolute: 26 {cells}/uL (ref 15–500)
Eosinophils Relative: 0.3 %
HCT: 44.3 % (ref 35.0–45.0)
Hemoglobin: 14.5 g/dL (ref 11.7–15.5)
MCH: 31.2 pg (ref 27.0–33.0)
MCHC: 32.7 g/dL (ref 32.0–36.0)
MCV: 95.3 fL (ref 80.0–100.0)
MPV: 11.2 fL (ref 7.5–12.5)
Monocytes Relative: 10.1 %
Neutro Abs: 6837 {cells}/uL (ref 1500–7800)
Neutrophils Relative %: 79.5 %
Platelets: 295 10*3/uL (ref 140–400)
RBC: 4.65 10*6/uL (ref 3.80–5.10)
RDW: 12.7 % (ref 11.0–15.0)
Total Lymphocyte: 9.8 %
WBC: 8.6 10*3/uL (ref 3.8–10.8)

## 2023-02-18 LAB — COMPLETE METABOLIC PANEL WITH GFR
AG Ratio: 1.8 (calc) (ref 1.0–2.5)
ALT: 14 U/L (ref 6–29)
AST: 16 U/L (ref 10–35)
Albumin: 4.2 g/dL (ref 3.6–5.1)
Alkaline phosphatase (APISO): 58 U/L (ref 37–153)
BUN/Creatinine Ratio: 14 (calc) (ref 6–22)
BUN: 15 mg/dL (ref 7–25)
CO2: 30 mmol/L (ref 20–32)
Calcium: 9.5 mg/dL (ref 8.6–10.4)
Chloride: 106 mmol/L (ref 98–110)
Creat: 1.06 mg/dL — ABNORMAL HIGH (ref 0.60–1.00)
Globulin: 2.3 g/dL (ref 1.9–3.7)
Glucose, Bld: 90 mg/dL (ref 65–99)
Potassium: 4.5 mmol/L (ref 3.5–5.3)
Sodium: 141 mmol/L (ref 135–146)
Total Bilirubin: 0.4 mg/dL (ref 0.2–1.2)
Total Protein: 6.5 g/dL (ref 6.1–8.1)
eGFR: 54 mL/min/{1.73_m2} — ABNORMAL LOW (ref >=60)

## 2023-02-18 LAB — SJOGRENS SYNDROME-A EXTRACTABLE NUCLEAR ANTIBODY: SSA (Ro) (ENA) Antibody, IgG: >8 AI — AB

## 2023-02-18 LAB — VITAMIN D 25 HYDROXY (VIT D DEFICIENCY, FRACTURES): Vit D, 25-Hydroxy: 38 ng/mL (ref 30–100)

## 2023-02-18 NOTE — Progress Notes (Signed)
 Absolute lymphocyte is low due to immunosuppression.  Creatinine is mildly elevated.  Vitamin D is within normal limits.  Patient should increase water intake.  She should take vitamin D 2000 units daily.

## 2023-03-09 ENCOUNTER — Encounter (INDEPENDENT_AMBULATORY_CARE_PROVIDER_SITE_OTHER): Payer: Self-pay | Admitting: Otolaryngology

## 2023-03-09 ENCOUNTER — Ambulatory Visit (INDEPENDENT_AMBULATORY_CARE_PROVIDER_SITE_OTHER): Payer: Medicare PPO | Admitting: Otolaryngology

## 2023-03-09 VITALS — BP 155/80 | HR 77 | Ht 67.0 in

## 2023-03-09 DIAGNOSIS — R0982 Postnasal drip: Secondary | ICD-10-CM

## 2023-03-09 DIAGNOSIS — M349 Systemic sclerosis, unspecified: Secondary | ICD-10-CM | POA: Diagnosis not present

## 2023-03-09 DIAGNOSIS — J3089 Other allergic rhinitis: Secondary | ICD-10-CM | POA: Diagnosis not present

## 2023-03-09 DIAGNOSIS — K219 Gastro-esophageal reflux disease without esophagitis: Secondary | ICD-10-CM | POA: Diagnosis not present

## 2023-03-09 DIAGNOSIS — J383 Other diseases of vocal cords: Secondary | ICD-10-CM

## 2023-03-09 DIAGNOSIS — R490 Dysphonia: Secondary | ICD-10-CM | POA: Diagnosis not present

## 2023-03-09 DIAGNOSIS — M3500 Sicca syndrome, unspecified: Secondary | ICD-10-CM

## 2023-03-09 MED ORDER — DESLORATADINE 5 MG PO TABS: 5.0000 mg | ORAL_TABLET | Freq: Every day | ORAL | 3 refills | Status: AC

## 2023-03-09 MED ORDER — FLUTICASONE PROPIONATE 50 MCG/ACT NA SUSP: 2.0000 | Freq: Every day | NASAL | 6 refills | Status: AC

## 2023-03-09 NOTE — Progress Notes (Signed)
ENT CONSULT:  Reason for Consult: chronic hoarseness    HPI: Discussed the use of AI scribe software for clinical note transcription with the patient, who gave verbal consent to proceed.  History of Present Illness   The patient is a 78 yoF, with a known history of interstitial lung disease and scleroderma, presents with chronic voice changes described as a 'croakiness' in the voice that has been present for approximately 20 years. The patient reports no associated pain with talking or swallowing and denies any episodes of complete voice loss. The patient has a past smoking history of about 15 years but quit several decades ago when trying to get pregnant for the first time.  The patient is currently on Imuran for scleroderma management. Denies difficulties with breathing or swallowing The patient denies any history of dry mouth or excessive thick secretions, although they occasionally feel the need for water. The patient also denies any history of heartburn symptoms or reflux symptoms and has never been on reflux medications.    Records Reviewed:  Office visit with Pulm, Dr Marchelle Gearing 01/26/23 OV 11/10/2017   Background history followed by Dr. Corliss Skains - Scleroderma, limited (HCC) - Hx sclerodactyly, Raynauds, arthralgias, +ANA, +Ro, +La, +RF ; Off PLQ due to Macular Degenration Concern.  - Raynaud's disease without gangrene-it is not very symptomatic currently.  - Sicca syndrome-she continues to have dry mouth and dry eyes.  The symptoms are tolerable.  - Primary osteoarthritis of both hands-she has some changes consistent with osteoarthritis. - Antibiodies - 10/06/17   - neg CCP and RA     Current history -78 year old lady with the above medical problems.  Known to have scleroderma for at least 10 years according to history.  She tells me that she has had insidious onset of shortness of breath for heavy exertion after doing long walks for a few to several years.  It is stable and has not  changed.  She thought this was because of aging.  She does not have scleroderma.  She had pulmonary function test in the past but she does not know the result.  Looking at it it shows reduction in diffusion capacity.  Most recently based on the history and review of rheumatology charts it appears that she started having arthritis in her forearm.  This was in August 2019.  Rheumatoid arthritis work-up was negative on serology.  However because the arthritis was felt to be inflammatory methotrexate was considered.  Because of her previous abnormal pulmonary function test high-resolution CT chest was obtained and it showed probable UIP pattern ILD.  I personally visualized the CT chest and agree with those findings.  Repeat pulmonary function test showed stability.  Therefore she has been referred here.  In the phone conversation with Dr. Algis Downs I was okay with patient starting Imuran against both arthritis and ILD.  At this point that she is feeling stable.  We asked her to do interstitial lung disease questionnaire.  We sent this to her to her house but she forgot to bring it.  Walking desaturation test was normal without any desaturations.   #ILD   Interstitial lung disease secondary to scleroderma.- mildly and slwly progressive over time but stable since last visit On Ofev since Mar 10, 2022 and toearing it well  LFT normal Oct 2024     Plan -Continue Imuran that was prescribed by Dr. Algis Downs for your joint pain - Cotninue ofev low dose protocol at 100mg  twice daily               -  rehceck LFT 01/26/2023 - do spirometry and dlco in 6 months   #VOice hoarseness    - Scleroderma patients do have silent acid reflux. And this can be caussing voice change   Plan - refer ENT   Followup -6 months - 15 min visit with Dr Marchelle Gearing                         - walk and ILD symptoms score at f/u  Rheumatology office visit 02/17/23  istory of Present Illness: Lindsay Herman is a 78 y.o. female with limited  systemic sclerosis and ILD.  She states she has intermittent discomfort in her hands.  She has been having some discomfort in her left middle finger.  She denies any increased shortness of breath or palpitations.  She states she has been having hoarseness of her voice for which Dr. Colletta Maryland referred her to an ENT.  She denies any increased reflux symptoms.  She denies dry mouth and dry eyes. Patient was evaluated by Dr. Marchelle Gearing on January 26, 2023.  He noted mildly and slowly progressive ILD which is stable according to his office visit note.  She has been on Ofev 100 mg p.o. twice daily since March 10, 2022.  He recommended to continue Imuran.  Patient will get PFTs every 6 months.  He also referred patient to ENT for voice hoarseness. Patient was evaluated by Dr. Dietrich Pates in November 2023.  Her last echocardiogram was in August 2023.  Patient was advised to follow-up in 1 year.  Assessment / Plan:     Visit Diagnoses: Scleroderma, limited (HCC) - Hx sclerodactyly, Raynauds, arthralgias, ILD, +ANA, +Ro, +La, -RF. -Patient denies any increased skin tightness.  She states Raynauds symptoms are manageable.  There is no history of digital ulcers.  She has no increased shortness of breath or palpitations.  She was recently evaluated by Dr. Marchelle Gearing.  He advised to continue on current treatment.  Plan: Sjogrens syndrome-A extractable nuclear antibody   Chronic inflammatory arthritis - RF-, CCP-, 14-3-3 eta negative, R wrist synovitis, erosive changes, intercarpal and radiocarpal joint space narrowing.  No inflammation was noted on the examination today.  She has osteoarthritis with PIP and DIP thickening.  Joint protection was discussed.   Interstitial lung disease (HCC) - Followed by Dr. Marchelle Gearing.  Patient was evaluated by him in December.  He recommended continuing Ofev and Imuran.   High risk medication use - Imuran 50 mg po daily. Off PLQ due to Macular Degenration Concern.  Ofev 100 mg p.o.  twice daily started on March 10, 2022. -Labs from November 24, 2022 CBC and CMP were normal.  Will get labs today and every 3 months to monitor for drug toxicity.  Plan: CBC with Differential/Platelet, COMPLETE METABOLIC PANEL WITH GFR.  Information on immunization was placed in the AVS.   Raynaud's disease without gangrene-keeping core temperature warm clothing was discussed.   TPMT intermediate metabolizer (HCC)  Hoarseness of voice-patient recent reports recent hoarseness of voice.  She was referred to ENT by Dr. Colletta Maryland.  She denies any dry mouth and dry eyes.  She had good salivary pool.  She denies reflux symptoms.   History of gastroesophageal reflux (GERD)-currently symptomatic per patient.   History of colon cancer   Past Medical History:  Diagnosis Date   Atherosclerosis    aorta and coronary artery   Atrophic vaginitis    Cancer (HCC) 1995   Colon cancer tx surgery and chemo  Contact lens/glasses fitting    wears contacts or glasses   Cystocele    Nasal fracture    Osteopenia 06/2017   T score -2.0 FRAX 13% / 2.8%   PONV (postoperative nausea and vomiting)    Raynaud's disease    Rectocele    Rosacea    Scleroderma (HCC)    Urinary frequency     Past Surgical History:  Procedure Laterality Date   APPENDECTOMY     colon cancer  1995   COLON RESECTION  1995   KNEE ARTHROSCOPY Left 01/14/2019   Procedure: ARTHROSCOPY KNEE, PARTIAL MENISCECTOMY, CHONDROPLASTY AND LOOSE BODY REMOVAL;  Surgeon: Jodi Geralds, MD;  Location: [facility];  Service: Orthopedics;  Laterality: Left;   MASS EXCISION Left 08/31/2012   Procedure: EXCISION MUCOID CYST DEBRIDEMENT DISTAL INTERPHALANGEAL LEFT MIDDLE FINGER;  Surgeon: Nicki Reaper, MD;  Location: [facility];  Service: Orthopedics;  Laterality: Left;   OOPHORECTOMY     LSO   ROTATOR CUFF REPAIR Right    TONSILLECTOMY     VAGINAL HYSTERECTOMY  1994    Family History  Problem Relation Age of  Onset   Diabetes Mother    Heart disease Mother    Stroke Mother    Diabetes Brother    Hypertension Brother    Lupus Father     Social History:  reports that she quit smoking about 50 years ago. Her smoking use included cigarettes. She started smoking about 60 years ago. She has a 4.8 pack-year smoking history. She has been exposed to tobacco smoke. She has never used smokeless tobacco. She reports current alcohol use of about 3.0 standard drinks of alcohol per week. She reports that she does not use drugs.  Allergies:  Allergies  Allergen Reactions   Codeine Nausea And Vomiting   Morphine Other (See Comments)    Other reaction(s): Unknown   Morphine And Codeine Nausea And Vomiting    Medications: I have reviewed the patient's current medications.  The PMH, PSH, Medications, Allergies, and SH were reviewed and updated.  ROS: Constitutional: Negative for fever, weight loss and weight gain. Cardiovascular: Negative for chest pain and dyspnea on exertion. Respiratory: Is not experiencing shortness of breath at rest. Gastrointestinal: Negative for nausea and vomiting. Neurological: Negative for headaches. Psychiatric: The patient is not nervous/anxious  Blood pressure (!) 155/80, pulse 77, height 5\' 7"  (1.702 m).  PHYSICAL EXAM:  Exam: General: Well-developed, well-nourished Communication and Voice:  raspy strained  Respiratory Respiratory effort: Equal inspiration and expiration without stridor Cardiovascular Peripheral Vascular: Warm extremities with equal color/perfusion Eyes: No nystagmus with equal extraocular motion bilaterally Neuro/Psych/Balance: Patient oriented to person, place, and time; Appropriate mood and affect; Gait is intact with no imbalance; Cranial nerves I-XII are intact Head and Face Inspection: Normocephalic and atraumatic without mass or lesion Palpation: Facial skeleton intact without bony stepoffs Salivary Glands: No mass or tenderness Facial  Strength: Facial motility symmetric and full bilaterally ENT Pinna: External ear intact and fully developed External canal: Canal is patent with intact skin Tympanic Membrane: Clear and mobile External Nose: No scar or anatomic deformity Internal Nose: Septum is deviated to the left. No polyp, or purulence. Mucosal edema and erythema present.  Bilateral inferior turbinate hypertrophy.  Lips, Teeth, and gums: Mucosa and teeth intact and viable TMJ: No pain to palpation with full mobility Oral cavity/oropharynx: No erythema or exudate, no lesions present Nasopharynx: No mass or lesion with intact mucosa Hypopharynx: Intact mucosa without pooling of  secretions Larynx Glottic: Full true vocal cord mobility without lesion or mass Supraglottic: Normal appearing epiglottis and AE folds Interarytenoid Space: Moderate pachydermia&edema Subglottic Space: Patent without lesion or edema Neck Neck and Trachea: Midline trachea without mass or lesion Thyroid: No mass or nodularity Lymphatics: No lymphadenopathy  Procedure: Summary of Video-Laryngeal-Stroboscopy: b/l VF atrophy and glottic insufficiency supraglottic compression, findings of GERD LPR and post-nasal drainage, no lesions, mucosal wave symmetric and intact   Preoperative diagnosis: hoarseness  Postoperative diagnosis:   same  Procedure: Flexible fiberoptic laryngoscopy with stroboscopy (40981)  Surgeon: Ashok Croon, MD  Anesthesia: Topical lidocaine and Afrin  Complications: None  Condition is stable throughout exam  Indications and consent:   The patient presents to the clinic with hoarseness. All the risks, benefits, and potential complications were reviewed with the patient preoperatively and informed verbal consent was obtained.  Procedure: The patient was seated upright in the exam chair.   Topical lidocaine and Afrin were applied to the nasal cavity. After adequate anesthesia had occurred, the flexible telescope was  passed into the nasal cavity. The nasopharynx was patent without mass or lesion. The scope was passed behind the soft palate and directed toward the base of tongue. The base of tongue was visualized and was symmetric with no apparent masses or abnormal appearing tissue. There were no signs of a mass or pooling of secretions in the piriform sinuses. The supraglottic structures were normal.  The true vocal cords are mobile and slightly bowed. The medial edges were bowed. Closure was incomplete with supraglottic compression. Periodicity present. The mucosal wave and amplitude were intact normal and symmetric. There is moderate interarytenoid pachydermia and post cricoid edema. The mucosa appears without lesions.   The laryngoscope was then slowly withdrawn and the patient tolerated the procedure well. There were no complications or blood loss.  Studies Reviewed: CT chest high-resolution 09/30/21  ADDENDUM: Estimated percentage of fibrosis is 15-20 percent of the lung involved with the mild fibrotic changes, including the areas of cylindrical bronchiectasis as originally described in the report.  IMPRESSION: 1. The appearance of the lungs remains compatible with interstitial lung disease, with a spectrum of findings once again categorized as probable usual interstitial pneumonia (UIP) per current ATS guidelines. Minimal progression of disease compared to the prior study from 2001. 2. Aortic atherosclerosis, in addition to two-vessel coronary artery disease. Please note that although the presence of coronary artery calcium documents the presence of coronary artery disease, the severity of this disease and any potential stenosis cannot be assessed on this non-gated CT examination. Assessment for potential risk factor modification, dietary therapy or pharmacologic therapy may be warranted, if clinically indicated. 3. Cholelithiasis.  Assessment/Plan: Encounter Diagnoses  Name Primary?    Chronic GERD    Scleroderma, limited (HCC)    Sjogren's syndrome, with unspecified organ involvement (HCC)    Dysphonia Yes   Vocal fold atrophy    Glottic insufficiency    Environmental and seasonal allergies    Post-nasal drip     Assessment and Plan    Chronic Voice Changes/Dysphonia Chronic voice changes for approximately 20 years, described as strained raspy high-pitch voice. No pain with talking or swallowing. No complete voice loss. Former smoker (15 years, quit long time ago). Laryngoscopy revealed vocal fold atrophy and glottic insufficiency, postnasal drainage, and signs of silent reflux/GERD LPR. No lesions or masses observed. Voice therapy is the first line of treatment. Procedural interventions can be considered if therapy fails and patient is interested. Minimizing reflux and  postnasal drainage can improve voice quality. Patient agreed to try speech therapy and medications for reflux and allergies. - Refer to speech for voice therapy - Prescribe Flonase nasal spray 2 puffs b/l nares  - Prescribe Clarinex 5 mg daily QHS - Recommend nasal rinses to reduce nasal drainage  - Advise dietary changes to minimize reflux (e.g., avoid late meals, elevate head of bed) - Recommend seaweed paste supplement for reflux management reflux Gourmet (available on Amazon)  GERD LPR - Advise dietary changes to minimize reflux (e.g., avoid late meals, elevate head of bed) - Recommend seaweed paste supplement for reflux management reflux Gourmet (available on Amazon)  Chronic Nasal congestion and post-nasal drip - Prescribe Flonase nasal spray 2 puffs b/l nares  - Prescribe Clarinex 5 mg daily QHS - Recommend nasal rinses to reduce nasal drainage    Scleroderma Diagnosed around 2014. Managed with Imuran. No current issues with swallowing or shortness of breath. Lung disease well-managed. - f/u with Rheum  Follow-up - Schedule follow-up as needed based on symptom progression or patient  preference.        Thank you for allowing me to participate in the care of this patient. Please do not hesitate to contact me with any questions or concerns.   Ashok Croon, MD Otolaryngology [facility] Phone: [phone] Fax: [phone]    03/09/2023, 12:12 PM

## 2023-03-09 NOTE — Patient Instructions (Signed)

## 2023-03-20 DIAGNOSIS — R058 Other specified cough: Secondary | ICD-10-CM | POA: Diagnosis not present

## 2023-03-20 DIAGNOSIS — R5081 Fever presenting with conditions classified elsewhere: Secondary | ICD-10-CM | POA: Diagnosis not present

## 2023-03-20 DIAGNOSIS — J189 Pneumonia, unspecified organism: Secondary | ICD-10-CM | POA: Diagnosis not present

## 2023-03-30 ENCOUNTER — Other Ambulatory Visit: Payer: Self-pay | Admitting: Internal Medicine

## 2023-03-30 DIAGNOSIS — Z79899 Other long term (current) drug therapy: Secondary | ICD-10-CM

## 2023-03-30 DIAGNOSIS — E782 Mixed hyperlipidemia: Secondary | ICD-10-CM

## 2023-03-30 DIAGNOSIS — I2510 Atherosclerotic heart disease of native coronary artery without angina pectoris: Secondary | ICD-10-CM

## 2023-04-02 ENCOUNTER — Other Ambulatory Visit: Payer: Self-pay | Admitting: Physician Assistant

## 2023-04-02 NOTE — Telephone Encounter (Signed)
Last Fill: 01/05/2023  Labs: 02/17/2023 Absolute lymphocyte is low due to immunosuppression.  Creatinine is mildly elevated.   Next Visit: 07/29/2023  Last Visit: 02/17/2023  DX: Scleroderma, limited   Current Dose per office note 02/17/2023: Imuran 50 mg po daily.   Okay to refill Imuran?

## 2023-05-28 ENCOUNTER — Other Ambulatory Visit: Payer: Self-pay | Admitting: Internal Medicine

## 2023-06-01 DIAGNOSIS — J849 Interstitial pulmonary disease, unspecified: Secondary | ICD-10-CM | POA: Diagnosis not present

## 2023-06-01 DIAGNOSIS — M1990 Unspecified osteoarthritis, unspecified site: Secondary | ICD-10-CM | POA: Diagnosis not present

## 2023-06-01 DIAGNOSIS — I10 Essential (primary) hypertension: Secondary | ICD-10-CM | POA: Diagnosis not present

## 2023-06-01 DIAGNOSIS — E039 Hypothyroidism, unspecified: Secondary | ICD-10-CM | POA: Diagnosis not present

## 2023-06-01 DIAGNOSIS — E538 Deficiency of other specified B group vitamins: Secondary | ICD-10-CM | POA: Diagnosis not present

## 2023-06-01 DIAGNOSIS — R5383 Other fatigue: Secondary | ICD-10-CM | POA: Diagnosis not present

## 2023-06-01 DIAGNOSIS — I700 Atherosclerosis of aorta: Secondary | ICD-10-CM | POA: Diagnosis not present

## 2023-06-28 ENCOUNTER — Other Ambulatory Visit: Payer: Self-pay | Admitting: Internal Medicine

## 2023-07-11 ENCOUNTER — Other Ambulatory Visit: Payer: Self-pay | Admitting: Internal Medicine

## 2023-07-15 DIAGNOSIS — E039 Hypothyroidism, unspecified: Secondary | ICD-10-CM | POA: Diagnosis not present

## 2023-07-15 DIAGNOSIS — I10 Essential (primary) hypertension: Secondary | ICD-10-CM | POA: Diagnosis not present

## 2023-07-15 DIAGNOSIS — F02A0 Dementia in other diseases classified elsewhere, mild, without behavioral disturbance, psychotic disturbance, mood disturbance, and anxiety: Secondary | ICD-10-CM | POA: Diagnosis not present

## 2023-07-15 DIAGNOSIS — E785 Hyperlipidemia, unspecified: Secondary | ICD-10-CM | POA: Diagnosis not present

## 2023-07-15 DIAGNOSIS — I700 Atherosclerosis of aorta: Secondary | ICD-10-CM | POA: Diagnosis not present

## 2023-07-16 ENCOUNTER — Other Ambulatory Visit (HOSPITAL_BASED_OUTPATIENT_CLINIC_OR_DEPARTMENT_OTHER): Payer: Self-pay | Admitting: Internal Medicine

## 2023-07-16 DIAGNOSIS — R413 Other amnesia: Secondary | ICD-10-CM

## 2023-07-16 DIAGNOSIS — G3184 Mild cognitive impairment, so stated: Secondary | ICD-10-CM

## 2023-07-17 ENCOUNTER — Ambulatory Visit (HOSPITAL_BASED_OUTPATIENT_CLINIC_OR_DEPARTMENT_OTHER)
Admission: RE | Admit: 2023-07-17 | Discharge: 2023-07-17 | Disposition: A | Discharge status: Home / Self Care | Source: Ambulatory Visit | Attending: Physician Assistant | Admitting: Physician Assistant

## 2023-07-17 DIAGNOSIS — R413 Other amnesia: Secondary | ICD-10-CM | POA: Diagnosis not present

## 2023-07-17 DIAGNOSIS — G319 Degenerative disease of nervous system, unspecified: Secondary | ICD-10-CM | POA: Diagnosis not present

## 2023-07-17 DIAGNOSIS — I6782 Cerebral ischemia: Secondary | ICD-10-CM | POA: Diagnosis not present

## 2023-07-17 MED ORDER — GADOBUTROL 1 MMOL/ML IV SOLN
7.0000 mL | Freq: Once | INTRAVENOUS | Status: AC | PRN
  Administered 2023-07-17: 7 mL via INTRAVENOUS
  Filled 2023-07-17: qty 7.5

## 2023-07-18 NOTE — Progress Notes (Signed)
 Office Visit Note  Patient: Lindsay Herman             Date of Birth: 11/28/1945           MRN: [id number]             PCP: Suan Elm, MD Referring: Suan Elm, MD Visit Date: 07/29/2023 Occupation: @GUAROCC @  Subjective:  Medication management  History of Present Illness: SAJE GALLOP is a 78 y.o. female with limited systemic sclerosis and interstitial lung disease.  She returns today after her last visit in January 2025.  She denies any joint pain or Raynaud's phenomenon.  She denies any shortness of breath.  She denies sicca symptoms, oral ulcers, Raynaud's phenomenon.  Patient was evaluated by Dr. Larkin Plumb  ENT.  After the evaluation she was referred to speech for voice therapy.  She was also prescribed Flonase  nasal spray and Clarinex  5 mg p.o. nightly.  Nasal rinses were advised to reduce nasal drainage.  Reflux precautions were advised. She was evaluated by Dr. Bertrum Brodie  in December 2024.  He recommended continuing Imuran  and low-dose Ofev  100 mg twice daily.  He plans to do spirometry and DLCO in next months.  Activities of Daily Living:  Patient reports morning stiffness for 0 none.   Patient Denies nocturnal pain.  Difficulty dressing/grooming: Denies Difficulty climbing stairs: Denies Difficulty getting out of chair: Denies Difficulty using hands for taps, buttons, cutlery, and/or writing: Denies  Review of Systems  Constitutional:  Positive for fatigue.  HENT:  Negative for mouth sores and mouth dryness.   Eyes:  Negative for dryness.  Respiratory:  Negative for shortness of breath.   Cardiovascular:  Negative for chest pain and palpitations.  Gastrointestinal:  Negative for blood in stool, constipation and diarrhea.  Endocrine: Positive for increased urination.  Genitourinary:  Negative for involuntary urination.  Musculoskeletal:  Negative for joint pain, gait problem, joint pain, joint swelling, myalgias, muscle weakness, morning stiffness,  muscle tenderness and myalgias.  Skin:  Negative for color change, rash, hair loss and sensitivity to sunlight.  Allergic/Immunologic: Negative for susceptible to infections.  Neurological:  Negative for dizziness and headaches.  Hematological:  Negative for swollen glands.  Psychiatric/Behavioral:  Negative for depressed mood and sleep disturbance. The patient is not nervous/anxious.     PMFS History:  Patient Active Problem List   Diagnosis Date Noted   Mild cognitive impairment 06/30/2021   Acute medial meniscal tear, left, initial encounter 01/14/2019   Chondromalacia, left knee 01/14/2019   Loose body of left knee 01/14/2019   Scleroderma, limited (HCC) 02/10/2016   History of environmental allergies 02/10/2016   Gastroesophageal reflux disease 02/10/2016   History of colon cancer 02/10/2016   Sicca syndrome 02/01/2016   History of repair of right rotator cuff 02/01/2016   Primary osteoarthritis of both knees 02/01/2016   Primary osteoarthritis of both hands 02/01/2016   Raynaud's disease without gangrene 02/01/2016   Cystocele    Atrophic vaginitis    Osteopenia    Menopausal symptoms     Past Medical History:  Diagnosis Date   Atherosclerosis    aorta and coronary artery   Atrophic vaginitis    Cancer (HCC) 1995   Colon cancer tx surgery and chemo   Contact lens/glasses fitting    wears contacts or glasses   Cystocele    Nasal fracture    Osteopenia 06/2017   T score -2.0 FRAX 13% / 2.8%   PONV (postoperative nausea and vomiting)  Raynaud's disease    Rectocele    Rosacea    Scleroderma (HCC)    Urinary frequency     Family History  Problem Relation Age of Onset   Diabetes Mother    Heart disease Mother    Stroke Mother    Diabetes Brother    Hypertension Brother    Lupus Father    Past Surgical History:  Procedure Laterality Date   APPENDECTOMY     colon cancer  1995   COLON RESECTION  1995   KNEE ARTHROSCOPY Left 01/14/2019   Procedure:  ARTHROSCOPY KNEE, PARTIAL MENISCECTOMY, CHONDROPLASTY AND LOOSE BODY REMOVAL;  Surgeon: Neil Balls, MD;  Location: [facility];  Service: Orthopedics;  Laterality: Left;   MASS EXCISION Left 08/31/2012   Procedure: EXCISION MUCOID CYST DEBRIDEMENT DISTAL INTERPHALANGEAL LEFT MIDDLE FINGER;  Surgeon: Kemp Patter, MD;  Location: [facility];  Service: Orthopedics;  Laterality: Left;   OOPHORECTOMY     LSO   ROTATOR CUFF REPAIR Right    TONSILLECTOMY     VAGINAL HYSTERECTOMY  1994   Social History   Social History Narrative   Right handed   Drinks caffeine   Two story home   Immunization History  Administered Date(s) Administered   Influenza Split 12/12/2010   Influenza, High Dose Seasonal PF 10/24/2017, 11/08/2018   PFIZER(Purple Top)SARS-COV-2 Vaccination 03/18/2019, 04/12/2019, 09/29/2019, 03/27/2020   Pneumococcal Conjugate-13 07/24/2014   Pneumococcal Polysaccharide-23 01/31/2009   Zoster Recombinant(Shingrix) 11/27/2017, 02/12/2018     Objective: Vital Signs: BP 112/74 (BP Location: Left Arm, Patient Position: Sitting, Cuff Size: Normal)   Pulse 74   Resp 17   Ht 5' 7 (1.702 m)   Wt 156 lb (70.8 kg)   BMI 24.43 kg/m    Physical Exam Vitals and nursing note reviewed.  Constitutional:      Appearance: She is well-developed.  HENT:     Head: Normocephalic and atraumatic.   Eyes:     Conjunctiva/sclera: Conjunctivae normal.    Cardiovascular:     Rate and Rhythm: Normal rate and regular rhythm.     Heart sounds: Normal heart sounds.  Pulmonary:     Effort: Pulmonary effort is normal.     Breath sounds: Normal breath sounds.     Comments: Few crackles were audible in the lung bases. Abdominal:     General: Bowel sounds are normal.     Palpations: Abdomen is soft.   Musculoskeletal:     Cervical back: Normal range of motion.  Lymphadenopathy:     Cervical: No cervical adenopathy.   Skin:    General: Skin is warm and dry.      Capillary Refill: Capillary refill takes 2 to 3 seconds.     Comments: Palmar telangiectasia noted.  Sclerodactyly distal to MCPs was noted.  Nailbed capillary changes were noted.   Neurological:     Mental Status: She is alert and oriented to person, place, and time.   Psychiatric:        Behavior: Behavior normal.      Musculoskeletal Exam: She had limited lateral rotation of the cervical spine.  Shoulders, elbows, wrists were in good range of motion.  MCPs, PIPs and DIPs were in good range of motion.  Bilateral PIP and DIP thickening due to osteoarthritis was noted.  Sclerodactyly was noted.  Hip joints and knee joints were in good range of motion without any warmth swelling or effusion.  There was no tenderness over ankles or MTPs.  CDAI Exam: CDAI Score: -- Patient Global: --; Provider Global: -- Swollen: --; Tender: -- Joint Exam 07/29/2023   No joint exam has been documented for this visit   There is currently no information documented on the homunculus. Go to the Rheumatology activity and complete the homunculus joint exam.  Investigation: No additional findings.  Imaging: MR BRAIN W WO CONTRAST Result Date: 07/20/2023 CLINICAL DATA:  Provided history: Memory loss. EXAM: MRI HEAD WITHOUT AND WITH CONTRAST TECHNIQUE: Multiplanar, multiecho pulse sequences of the brain and surrounding structures were obtained without and with intravenous contrast. CONTRAST:  7mL GADAVIST  GADOBUTROL  1 MMOL/ML IV SOLN COMPARISON:  None. FINDINGS: Brain: Generalized cerebral atrophy, mild for age. Tiny chronic cortical infarct within the posterior left frontal lobe (MCA vascular territory) (series 11, image 40). Multifocal T2 FLAIR hyperintense signal abnormality within the cerebral white matter, nonspecific but compatible with minimal chronic small vessel ischemic disease. No cortical encephalomalacia is identified. There is no acute infarct. No evidence of an intracranial mass. No chronic intracranial  blood products. No extra-axial fluid collection. No midline shift. No pathologic intracranial enhancement identified. Vascular: Maintained flow voids within the proximal large arterial vessels. Skull and upper cervical spine: No focal worrisome marrow lesion. Incompletely assessed cervical spondylosis. Sinuses/Orbits: No mass or acute finding within the imaged orbits. Prior bilateral ocular lens replacement. No significant paranasal sinus disease. IMPRESSION: 1.  No evidence of an acute intracranial abnormality. 2. Tiny chronic cortical infarct within the posterior left frontal lobe (MCA vascular territory). 3. Minor chronic small vessel ischemic changes within the cerebral white matter. 4. Mild generalized cerebral atrophy. Electronically Signed   By: Bascom Lily D.O.   On: 07/20/2023 09:28    Recent Labs: Lab Results  Component Value Date   WBC 8.6 02/17/2023   HGB 14.5 02/17/2023   PLT 295 02/17/2023   NA 141 02/17/2023   K 4.5 02/17/2023   CL 106 02/17/2023   CO2 30 02/17/2023   GLUCOSE 90 02/17/2023   BUN 15 02/17/2023   CREATININE 1.06 (H) 02/17/2023   BILITOT 0.4 02/17/2023   ALKPHOS 56 01/26/2023   AST 16 02/17/2023   ALT 14 02/17/2023   PROT 6.5 02/17/2023   ALBUMIN 4.2 01/26/2023   CALCIUM  9.5 02/17/2023   GFRAA 97 06/14/2020   QFTBGOLDPLUS NEGATIVE 10/06/2017    Speciality Comments: No specialty comments available.  Procedures:  No procedures performed Allergies: Codeine, Morphine, and Morphine and codeine   Assessment / Plan:     Visit Diagnoses: Scleroderma, limited (HCC) - Hx sclerodactyly, Raynauds, arthralgias, ILD, +ANA, +Ro, +La, -RF.  Patient denies any increased symptoms of the skin tightness.  She states she has had no episodes of Raynaud's phenomenon.  She had decreased capillary refill.  Nailbed capillary changes were noted.  Few telangiectasia were noted.  She denies any increased shortness of breath.  Chronic inflammatory arthritis - RF-, CCP-, 14-3-3 eta  negative, R wrist synovitis, erosive changes, intercarpal and radiocarpal joint space narrowing.  No synovitis was noted on the examination today.  Interstitial lung disease (HCC) -she is followed by Dr. Bertrum Brodie.  She is on Ofev  100 mg p.o. twice daily  High risk medication use - Imuran  50 mg po daily. Off PLQ due to Macular Degenration Concern.  Ofev  100 mg p.o. twice daily started on March 10, 2022.  Labs on February 17, 2023 CBC and CMP were normal except creatinine was mildly elevated at 1.06.  I will recheck labs today.  She was advised to get  labs every 3 months.  Information iMessage was placed in the AVS.  Raynaud's disease without gangrene-currently not symptomatic.  Keeping core temperature warm was discussed.  TPMT intermediate metabolizer (HCC)  History of repair of right rotator cuff-she had good range of motion without discomfort.  Primary osteoarthritis of both hands-she had PIP and DIP thickening.  Joint protection was discussed.  Primary osteoarthritis of both knees - S/P arthroscopy of right knee.  She had good range of motion without discomfort.  Osteopenia of multiple sites - Jun 14, 2021 DEXA scan T-score -2.3, BMD 0.593 left femoral neck no comparison.reclast  5 mg IV 08/25/22 by GYN Dr. Johnston Nao. Ttd with fosamax in the past.  Vitamin D  deficiency - 02/17/23 Vit d 38.  Patient states she is not taking vitamin D .  Will check vitamin D  level again today.  History of gastroesophageal reflux (GERD)  History of colon cancer  Memory loss  Hoarseness of voice - referred to ENT by Dr. Bertrum Brodie.  Orders: No orders of the defined types were placed in this encounter.  No orders of the defined types were placed in this encounter.    Follow-Up Instructions: Return in about 6 months (around 01/28/2024) for Scleroderma.   Nicholas Bari, MD  Note - This record has been created using Animal nutritionist.  Chart creation errors have been sought, but may not always  have  been located. Such creation errors do not reflect on  the standard of medical care.

## 2023-07-29 ENCOUNTER — Encounter: Payer: Self-pay | Admitting: Rheumatology

## 2023-07-29 ENCOUNTER — Ambulatory Visit: Payer: Medicare PPO | Attending: Rheumatology | Admitting: Rheumatology

## 2023-07-29 VITALS — BP 112/74 | HR 74 | Resp 17 | Ht 67.0 in | Wt 156.0 lb

## 2023-07-29 DIAGNOSIS — Z85038 Personal history of other malignant neoplasm of large intestine: Secondary | ICD-10-CM

## 2023-07-29 DIAGNOSIS — M349 Systemic sclerosis, unspecified: Secondary | ICD-10-CM | POA: Diagnosis not present

## 2023-07-29 DIAGNOSIS — Z79899 Other long term (current) drug therapy: Secondary | ICD-10-CM

## 2023-07-29 DIAGNOSIS — Z8719 Personal history of other diseases of the digestive system: Secondary | ICD-10-CM

## 2023-07-29 DIAGNOSIS — E559 Vitamin D deficiency, unspecified: Secondary | ICD-10-CM

## 2023-07-29 DIAGNOSIS — J849 Interstitial pulmonary disease, unspecified: Secondary | ICD-10-CM

## 2023-07-29 DIAGNOSIS — M170 Bilateral primary osteoarthritis of knee: Secondary | ICD-10-CM

## 2023-07-29 DIAGNOSIS — M19041 Primary osteoarthritis, right hand: Secondary | ICD-10-CM

## 2023-07-29 DIAGNOSIS — E8889 Other specified metabolic disorders: Secondary | ICD-10-CM

## 2023-07-29 DIAGNOSIS — Z9889 Other specified postprocedural states: Secondary | ICD-10-CM | POA: Diagnosis not present

## 2023-07-29 DIAGNOSIS — R490 Dysphonia: Secondary | ICD-10-CM

## 2023-07-29 DIAGNOSIS — M8589 Other specified disorders of bone density and structure, multiple sites: Secondary | ICD-10-CM

## 2023-07-29 DIAGNOSIS — I7300 Raynaud's syndrome without gangrene: Secondary | ICD-10-CM

## 2023-07-29 DIAGNOSIS — M19042 Primary osteoarthritis, left hand: Secondary | ICD-10-CM

## 2023-07-29 DIAGNOSIS — M1990 Unspecified osteoarthritis, unspecified site: Secondary | ICD-10-CM | POA: Diagnosis not present

## 2023-07-29 DIAGNOSIS — R413 Other amnesia: Secondary | ICD-10-CM

## 2023-07-29 NOTE — Patient Instructions (Signed)
 Standing Labs We placed an order today for your standing lab work.   Please have your standing labs drawn in September and every 3 months  Please have your labs drawn 2 weeks prior to your appointment so that the provider can discuss your lab results at your appointment, if possible.  Please note that you may see your imaging and lab results in MyChart before we have reviewed them. We will contact you once all results are reviewed. Please allow our office up to 72 hours to thoroughly review all of the results before contacting the office for clarification of your results.  WALK-IN LAB HOURS  Monday through Thursday from 8:00 am -12:30 pm and 1:00 pm-4:00 pm and Friday from 8:00 am-12:00 pm.  Patients with office visits requiring labs will be seen before walk-in labs.  You may encounter longer than normal wait times. Please allow additional time. Wait times may be shorter on  Monday and Thursday afternoons.  We do not book appointments for walk-in labs. We appreciate your patience and understanding with our staff.   Labs are drawn by [facility]. Please bring your co-pay at the time of your lab draw.  You may receive a bill from [facility] for your lab work.  Please note if you are on Hydroxychloroquine and and an order has been placed for a Hydroxychloroquine level,  you will need to have it drawn 4 hours or more after your last dose.  If you wish to have your labs drawn at another location, please call the office 24 hours in advance so we can fax the orders.  The office is located at [address], [address], [place], [place] [address]   If you have any questions regarding directions or hours of operation,  please call [phone].   As a reminder, please drink plenty of water  prior to coming for your lab work. Thanks!   Vaccines You are taking a medication(s) that can suppress your immune system.  The following immunizations are recommended: Flu annually Covid-19  Td/Tdap (tetanus,  diphtheria, pertussis) every 10 years Pneumonia (Prevnar 15 then Pneumovax 23 at least 1 year apart.  Alternatively, can take Prevnar 20 without needing additional dose) Shingrix: 2 doses from 4 weeks to 6 months apart  Please check with your PCP to make sure you are up to date.   If you have signs or symptoms of an infection or start antibiotics: First, call your PCP for workup of your infection. Hold your medication through the infection, until you complete your antibiotics, and until symptoms resolve if you take the following: Injectable medication (Actemra, Benlysta, Cimzia, Cosentyx , Enbrel, Humira, Kevzara, Orencia, Remicade, Simponi, Stelara, Taltz, Tremfya) Methotrexate  Leflunomide (Arava) Mycophenolate (Cellcept) Cloria Danger, Olumiant, or Rinvoq

## 2023-07-30 ENCOUNTER — Ambulatory Visit: Payer: Self-pay | Admitting: Rheumatology

## 2023-07-30 LAB — CBC WITH DIFFERENTIAL/PLATELET
Absolute Lymphocytes: 944 {cells}/uL (ref 850–3900)
Absolute Monocytes: 896 {cells}/uL (ref 200–950)
Basophils Absolute: 40 {cells}/uL (ref 0–200)
Basophils Relative: 0.5 %
Eosinophils Absolute: 112 {cells}/uL (ref 15–500)
Eosinophils Relative: 1.4 %
HCT: 44.4 % (ref 35.0–45.0)
Hemoglobin: 14.2 g/dL (ref 11.7–15.5)
MCH: 30.5 pg (ref 27.0–33.0)
MCHC: 32 g/dL (ref 32.0–36.0)
MCV: 95.3 fL (ref 80.0–100.0)
MPV: 10.5 fL (ref 7.5–12.5)
Monocytes Relative: 11.2 %
Neutro Abs: 6008 {cells}/uL (ref 1500–7800)
Neutrophils Relative %: 75.1 %
Platelets: 352 10*3/uL (ref 140–400)
RBC: 4.66 10*6/uL (ref 3.80–5.10)
RDW: 12.4 % (ref 11.0–15.0)
Total Lymphocyte: 11.8 %
WBC: 8 10*3/uL (ref 3.8–10.8)

## 2023-07-30 LAB — COMPREHENSIVE METABOLIC PANEL WITH GFR
AG Ratio: 1.7 (calc) (ref 1.0–2.5)
ALT: 15 U/L (ref 6–29)
AST: 16 U/L (ref 10–35)
Albumin: 4 g/dL (ref 3.6–5.1)
Alkaline phosphatase (APISO): 60 U/L (ref 37–153)
BUN: 15 mg/dL (ref 7–25)
CO2: 29 mmol/L (ref 20–32)
Calcium: 10.1 mg/dL (ref 8.6–10.4)
Chloride: 105 mmol/L (ref 98–110)
Creat: 0.79 mg/dL (ref 0.60–1.00)
Globulin: 2.3 g/dL (ref 1.9–3.7)
Glucose, Bld: 82 mg/dL (ref 65–99)
Potassium: 4.6 mmol/L (ref 3.5–5.3)
Sodium: 141 mmol/L (ref 135–146)
Total Bilirubin: 0.3 mg/dL (ref 0.2–1.2)
Total Protein: 6.3 g/dL (ref 6.1–8.1)
eGFR: 77 mL/min/{1.73_m2} (ref >=60)

## 2023-07-30 LAB — VITAMIN D 25 HYDROXY (VIT D DEFICIENCY, FRACTURES): Vit D, 25-Hydroxy: 28 ng/mL — ABNORMAL LOW (ref 30–100)

## 2023-07-30 NOTE — Progress Notes (Signed)
 Vitamin D  is low at 28.  Patient should take vitamin D  2000 units daily.  CBC and CMP normal.

## 2023-08-07 ENCOUNTER — Emergency Department (HOSPITAL_BASED_OUTPATIENT_CLINIC_OR_DEPARTMENT_OTHER)
Admission: EM | Admit: 2023-08-07 | Discharge: 2023-08-07 | Disposition: A | Discharge status: Home / Self Care | Attending: Emergency Medicine | Admitting: Emergency Medicine

## 2023-08-07 ENCOUNTER — Emergency Department (HOSPITAL_BASED_OUTPATIENT_CLINIC_OR_DEPARTMENT_OTHER)

## 2023-08-07 ENCOUNTER — Other Ambulatory Visit: Payer: Self-pay

## 2023-08-07 ENCOUNTER — Emergency Department (HOSPITAL_BASED_OUTPATIENT_CLINIC_OR_DEPARTMENT_OTHER): Admitting: Radiology

## 2023-08-07 DIAGNOSIS — S5011XA Contusion of right forearm, initial encounter: Secondary | ICD-10-CM | POA: Insufficient documentation

## 2023-08-07 DIAGNOSIS — S199XXA Unspecified injury of neck, initial encounter: Secondary | ICD-10-CM | POA: Diagnosis not present

## 2023-08-07 DIAGNOSIS — R55 Syncope and collapse: Secondary | ICD-10-CM | POA: Diagnosis not present

## 2023-08-07 DIAGNOSIS — J439 Emphysema, unspecified: Secondary | ICD-10-CM | POA: Diagnosis not present

## 2023-08-07 DIAGNOSIS — S0511XA Contusion of eyeball and orbital tissues, right eye, initial encounter: Secondary | ICD-10-CM | POA: Insufficient documentation

## 2023-08-07 DIAGNOSIS — M160 Bilateral primary osteoarthritis of hip: Secondary | ICD-10-CM | POA: Diagnosis not present

## 2023-08-07 DIAGNOSIS — S0083XA Contusion of other part of head, initial encounter: Secondary | ICD-10-CM

## 2023-08-07 DIAGNOSIS — W1830XA Fall on same level, unspecified, initial encounter: Secondary | ICD-10-CM | POA: Insufficient documentation

## 2023-08-07 DIAGNOSIS — M25552 Pain in left hip: Secondary | ICD-10-CM | POA: Diagnosis not present

## 2023-08-07 DIAGNOSIS — Z7982 Long term (current) use of aspirin: Secondary | ICD-10-CM | POA: Insufficient documentation

## 2023-08-07 DIAGNOSIS — S0993XA Unspecified injury of face, initial encounter: Secondary | ICD-10-CM | POA: Diagnosis not present

## 2023-08-07 DIAGNOSIS — S0990XA Unspecified injury of head, initial encounter: Secondary | ICD-10-CM | POA: Diagnosis not present

## 2023-08-07 DIAGNOSIS — W19XXXA Unspecified fall, initial encounter: Secondary | ICD-10-CM

## 2023-08-07 LAB — BASIC METABOLIC PANEL WITH GFR
Anion gap: 11 (ref 5–15)
BUN: 9 mg/dL (ref 8–23)
CO2: 24 mmol/L (ref 22–32)
Calcium: 9.7 mg/dL (ref 8.9–10.3)
Chloride: 103 mmol/L (ref 98–111)
Creatinine, Ser: 0.81 mg/dL (ref 0.44–1.00)
GFR, Estimated: >60 mL/min (ref >60)
Glucose, Bld: 101 mg/dL — ABNORMAL HIGH (ref 70–99)
Potassium: 4.3 mmol/L (ref 3.5–5.1)
Sodium: 139 mmol/L (ref 135–145)

## 2023-08-07 LAB — CBC WITH DIFFERENTIAL/PLATELET
Abs Immature Granulocytes: 0.04 10*3/uL (ref 0.00–0.07)
Basophils Absolute: 0 10*3/uL (ref 0.0–0.1)
Basophils Relative: 1 %
Eosinophils Absolute: 0.1 10*3/uL (ref 0.0–0.5)
Eosinophils Relative: 2 %
HCT: 43.8 % (ref 36.0–46.0)
Hemoglobin: 14.3 g/dL (ref 12.0–15.0)
Immature Granulocytes: 1 %
Lymphocytes Relative: 15 %
Lymphs Abs: 0.9 10*3/uL (ref 0.7–4.0)
MCH: 30.8 pg (ref 26.0–34.0)
MCHC: 32.6 g/dL (ref 30.0–36.0)
MCV: 94.2 fL (ref 80.0–100.0)
Monocytes Absolute: 0.7 10*3/uL (ref 0.1–1.0)
Monocytes Relative: 12 %
Neutro Abs: 4.3 10*3/uL (ref 1.7–7.7)
Neutrophils Relative %: 69 %
Platelets: 324 10*3/uL (ref 150–400)
RBC: 4.65 MIL/uL (ref 3.87–5.11)
RDW: 13.2 % (ref 11.5–15.5)
WBC: 6.1 10*3/uL (ref 4.0–10.5)
nRBC: 0 % (ref 0.0–0.2)

## 2023-08-07 NOTE — ED Provider Notes (Signed)
  EMERGENCY DEPARTMENT AT [facility] Provider Note   CSN: [id number] Arrival date & time: 08/07/23  1023     Patient presents with: [place]   Lindsay Herman is a 78 y.o. female.   Patient is a 78 year old female with past medical history of mild cognitive impairment and scleroderma presenting to the emergency department after a fall. Patient reports on Monday she was shopping at [employer] on Monday after eating a late lunch quickly and walking around the store when she started to feel unwell. She states she felt nauseous and likely she needed to sit down and then passed out. Bystanders helped her up and wheeled her out to her car and she felt ok and went home. Did notice a bruise and pain under her R eye, R forearm bruising and had some pain on the L back side of her head and tailbone. She states family and neighbors were concerned about her which is why she came to the ED today. She denies any recurrence of dizziness or syncope or any associated chest pain. States she just had some mild eye pain, denies any vision changes, numbness or weakness. Denies any blood thinners.  The history is provided by the patient.  Fall       Prior to Admission medications   Medication Sig Start Date End Date Taking? Authorizing Provider  aspirin  EC 81 MG tablet Take 1 tablet (81 mg total) by mouth daily. Swallow whole. Patient taking differently: Take 81 mg by mouth daily as needed. Swallow whole. 04/28/22   Okey Vina GAILS, MD  azaTHIOprine  (IMURAN ) 50 MG tablet TAKE 1 TABLET(50 MG) BY MOUTH DAILY 04/02/23   Dolphus Reiter, MD  Coenzyme Q10 (COQ10) 200 MG CAPS Take by mouth daily.    [provider]  desloratadine  (CLARINEX ) 5 MG tablet Take 1 tablet (5 mg total) by mouth daily. 03/09/23   Soldatova, Liuba, MD  donepezil (ARICEPT) 10 MG tablet 1 tablet at bedtime Orally Once a day for 30 days 06/01/23   [provider]  ezetimibe  (ZETIA ) 10 MG tablet TAKE 1 TABLET(10 MG) BY  MOUTH DAILY 03/31/23   Okey Vina GAILS, MD  fluticasone  (FLONASE ) 50 MCG/ACT nasal spray Place 2 sprays into both nostrils daily. Patient taking differently: Place 2 sprays into both nostrils daily as needed. 03/09/23   Soldatova, Liuba, MD  levofloxacin (LEVAQUIN) 500 MG tablet 1 tablet Orally Once a day for 7 days 03/20/23   [provider]  levothyroxine (SYNTHROID) 50 MCG tablet Take 1 tablet by mouth every evening.  Patient not taking: Reported on 07/29/2023 09/29/18   [provider]  levothyroxine (SYNTHROID) 75 MCG tablet 1 tablet in the morning on an empty stomach Orally Once a day for 90 days 06/03/23   [provider]  MAGNESIUM CITRATE PO Take 250 mg by mouth daily.    [provider]  Multiple Vitamins-Minerals (PRESERVISION AREDS 2) CAPS Take 1 capsule by mouth 2 (two) times daily.    [provider]  Nintedanib (OFEV ) 100 MG CAPS Take 1 capsule (100 mg total) by mouth 2 (two) times daily. 05/05/22   Geronimo Amel, MD  rosuvastatin  (CRESTOR ) 40 MG tablet TAKE 1 TABLET(40 MG) BY MOUTH DAILY 06/29/23   Okey Vina GAILS, MD  TURMERIC PO Take by mouth 2 (two) times daily.    [provider]  Vitamin D , Cholecalciferol, 25 MCG (1000 UT) CAPS Take 2,000 Units by mouth daily.    [provider]  Vitamin D ,  Ergocalciferol , (DRISDOL ) 1.25 MG (50000 UNIT) CAPS capsule Take 1 capsule (50,000 Units total) by mouth every 7 (seven) days. Patient not taking: Reported on 07/29/2023 08/19/22   Dolphus Reiter, MD    Allergies: Codeine, Morphine, and Morphine and codeine    Review of Systems  Updated Vital Signs BP (!) 149/93 (BP Location: Right Arm)   Pulse 86   Temp 98.3 F (36.8 C)   Resp 18   SpO2 97%   Physical Exam Vitals and nursing note reviewed.  Constitutional:      General: She is not in acute distress.    Appearance: Normal appearance.  HENT:     Head: Normocephalic and atraumatic.     Nose: Nose normal.      Mouth/Throat:     Mouth: Mucous membranes are dry.     Pharynx: Oropharynx is clear.   Eyes:     Extraocular Movements: Extraocular movements intact.     Conjunctiva/sclera: Conjunctivae normal.     Pupils: Pupils are equal, round, and reactive to light.     Comments: R infraorbital contusion with mild swelling and tenderness to palpation  Neck:     Comments: No midline neck tenderness Cardiovascular:     Rate and Rhythm: Normal rate and regular rhythm.     Heart sounds: Normal heart sounds.  Pulmonary:     Effort: Pulmonary effort is normal.     Breath sounds: Normal breath sounds.  Abdominal:     General: Abdomen is flat.     Palpations: Abdomen is soft.     Tenderness: There is no abdominal tenderness.   Musculoskeletal:        General: Normal range of motion.     Cervical back: Normal range of motion and neck supple.     Comments: No midline back tenderness No bony tenderness to bilateral UE or LE   Skin:    General: Skin is warm and dry.     Findings: Bruising (R forearm) present.   Neurological:     General: No focal deficit present.     Mental Status: She is alert and oriented to person, place, and time.     Cranial Nerves: No cranial nerve deficit.     Sensory: No sensory deficit.     Motor: No weakness.   Psychiatric:        Mood and Affect: Mood normal.        Behavior: Behavior normal.     (all labs ordered are listed, but only abnormal results are displayed) Labs Reviewed  BASIC METABOLIC PANEL WITH GFR - Abnormal; Notable for the following components:      Result Value   Glucose, Bld 101 (*)    All other components within normal limits  CBC WITH DIFFERENTIAL/PLATELET    EKG: EKG Interpretation Date/Time:  Friday August 07 2023 11:55:17 EDT Ventricular Rate:  67 PR Interval:  157 QRS Duration:  95 QT Interval:  406 QTC Calculation: 429 R Axis:   134  Text Interpretation: Right and left arm electrode reversal, interpretation assumes no reversal  Sinus or ectopic atrial rhythm Right axis deviation Low voltage, precordial leads Abnormal T, consider ischemia, lateral leads No significant change since last tracing Confirmed by Ellouise Fine ([id number]) on 08/07/2023 12:31:33 PM  Radiology: CT Head Wo Contrast Result Date: 08/07/2023 CLINICAL DATA:  Head trauma, minor (Age >= 65y); Facial trauma, blunt; Neck trauma (Age >= 65y) EXAM: CT HEAD WITHOUT CONTRAST CT MAXILLOFACIAL WITHOUT CONTRAST CT CERVICAL SPINE WITHOUT CONTRAST  TECHNIQUE: Multidetector CT imaging of the head, cervical spine, and maxillofacial structures were performed using the standard protocol without intravenous contrast. Multiplanar CT image reconstructions of the cervical spine and maxillofacial structures were also generated. RADIATION DOSE REDUCTION: This exam was performed according to the departmental dose-optimization program which includes automated exposure control, adjustment of the mA and/or kV according to patient size and/or use of iterative reconstruction technique. COMPARISON:  MRI head from 07/17/2023. FINDINGS: CT HEAD FINDINGS Brain: No evidence of acute infarction, hemorrhage, hydrocephalus, extra-axial collection or mass lesion/mass effect. Vascular: No hyperdense vessel or unexpected calcification. Skull: Normal. Negative for fracture or focal lesion. Other: None. CT MAXILLOFACIAL FINDINGS Osseous: No fracture or mandibular dislocation. No destructive process. Orbits: Negative. No traumatic or inflammatory finding. Sinuses: Clear. Soft tissues: Negative. CT CERVICAL SPINE FINDINGS Alignment: There is straightening of cervical spine, which may be positional or due to muscle spasm. There is grade 1 anterolisthesis of C4 over C5, grade 1 retrolisthesis of C5 over C6 and grade 1 anterolisthesis of C7 over T1, most likely degenerative. Skull base and vertebrae: No acute fracture. No primary bone lesion or focal pathologic process. Soft tissues and spinal canal: No prevertebral  fluid or swelling. No visible canal hematoma. Disc levels: Moderate multilevel degenerative changes characterized by moderately reduced intervertebral disc height, mainly at C5-C6 and C6-C7 levels), mild-to-moderate multilevel facet arthropathy and moderate to severe marginal osteophyte formation. Upper chest: Mild biapical pleuroparenchymal disease noted. There are scattered centrilobular emphysematous changes. No suspicious mass or pleural effusion. Other: None. IMPRESSION: 1. No acute intracranial abnormality. 2. No acute facial bone fracture. 3. No acute osseous injury or traumatic listhesis of the cervical spine. 4. Emphysema. Emphysema (ICD10-J43.9). Electronically Signed   By: Ree Molt M.D.   On: 08/07/2023 11:57   CT Cervical Spine Wo Contrast Result Date: 08/07/2023 CLINICAL DATA:  Head trauma, minor (Age >= 65y); Facial trauma, blunt; Neck trauma (Age >= 65y) EXAM: CT HEAD WITHOUT CONTRAST CT MAXILLOFACIAL WITHOUT CONTRAST CT CERVICAL SPINE WITHOUT CONTRAST TECHNIQUE: Multidetector CT imaging of the head, cervical spine, and maxillofacial structures were performed using the standard protocol without intravenous contrast. Multiplanar CT image reconstructions of the cervical spine and maxillofacial structures were also generated. RADIATION DOSE REDUCTION: This exam was performed according to the departmental dose-optimization program which includes automated exposure control, adjustment of the mA and/or kV according to patient size and/or use of iterative reconstruction technique. COMPARISON:  MRI head from 07/17/2023. FINDINGS: CT HEAD FINDINGS Brain: No evidence of acute infarction, hemorrhage, hydrocephalus, extra-axial collection or mass lesion/mass effect. Vascular: No hyperdense vessel or unexpected calcification. Skull: Normal. Negative for fracture or focal lesion. Other: None. CT MAXILLOFACIAL FINDINGS Osseous: No fracture or mandibular dislocation. No destructive process. Orbits: Negative.  No traumatic or inflammatory finding. Sinuses: Clear. Soft tissues: Negative. CT CERVICAL SPINE FINDINGS Alignment: There is straightening of cervical spine, which may be positional or due to muscle spasm. There is grade 1 anterolisthesis of C4 over C5, grade 1 retrolisthesis of C5 over C6 and grade 1 anterolisthesis of C7 over T1, most likely degenerative. Skull base and vertebrae: No acute fracture. No primary bone lesion or focal pathologic process. Soft tissues and spinal canal: No prevertebral fluid or swelling. No visible canal hematoma. Disc levels: Moderate multilevel degenerative changes characterized by moderately reduced intervertebral disc height, mainly at C5-C6 and C6-C7 levels), mild-to-moderate multilevel facet arthropathy and moderate to severe marginal osteophyte formation. Upper chest: Mild biapical pleuroparenchymal disease noted. There are scattered centrilobular emphysematous changes. No  suspicious mass or pleural effusion. Other: None. IMPRESSION: 1. No acute intracranial abnormality. 2. No acute facial bone fracture. 3. No acute osseous injury or traumatic listhesis of the cervical spine. 4. Emphysema. Emphysema (ICD10-J43.9). Electronically Signed   By: Ree Molt M.D.   On: 08/07/2023 11:57   CT Maxillofacial WO CM Result Date: 08/07/2023 CLINICAL DATA:  Head trauma, minor (Age >= 65y); Facial trauma, blunt; Neck trauma (Age >= 65y) EXAM: CT HEAD WITHOUT CONTRAST CT MAXILLOFACIAL WITHOUT CONTRAST CT CERVICAL SPINE WITHOUT CONTRAST TECHNIQUE: Multidetector CT imaging of the head, cervical spine, and maxillofacial structures were performed using the standard protocol without intravenous contrast. Multiplanar CT image reconstructions of the cervical spine and maxillofacial structures were also generated. RADIATION DOSE REDUCTION: This exam was performed according to the departmental dose-optimization program which includes automated exposure control, adjustment of the mA and/or kV  according to patient size and/or use of iterative reconstruction technique. COMPARISON:  MRI head from 07/17/2023. FINDINGS: CT HEAD FINDINGS Brain: No evidence of acute infarction, hemorrhage, hydrocephalus, extra-axial collection or mass lesion/mass effect. Vascular: No hyperdense vessel or unexpected calcification. Skull: Normal. Negative for fracture or focal lesion. Other: None. CT MAXILLOFACIAL FINDINGS Osseous: No fracture or mandibular dislocation. No destructive process. Orbits: Negative. No traumatic or inflammatory finding. Sinuses: Clear. Soft tissues: Negative. CT CERVICAL SPINE FINDINGS Alignment: There is straightening of cervical spine, which may be positional or due to muscle spasm. There is grade 1 anterolisthesis of C4 over C5, grade 1 retrolisthesis of C5 over C6 and grade 1 anterolisthesis of C7 over T1, most likely degenerative. Skull base and vertebrae: No acute fracture. No primary bone lesion or focal pathologic process. Soft tissues and spinal canal: No prevertebral fluid or swelling. No visible canal hematoma. Disc levels: Moderate multilevel degenerative changes characterized by moderately reduced intervertebral disc height, mainly at C5-C6 and C6-C7 levels), mild-to-moderate multilevel facet arthropathy and moderate to severe marginal osteophyte formation. Upper chest: Mild biapical pleuroparenchymal disease noted. There are scattered centrilobular emphysematous changes. No suspicious mass or pleural effusion. Other: None. IMPRESSION: 1. No acute intracranial abnormality. 2. No acute facial bone fracture. 3. No acute osseous injury or traumatic listhesis of the cervical spine. 4. Emphysema. Emphysema (ICD10-J43.9). Electronically Signed   By: Ree Molt M.D.   On: 08/07/2023 11:57   DG Pelvis 1-2 Views Result Date: 08/07/2023 CLINICAL DATA:  Fall.  Left hip pain. EXAM: PELVIS - 1-2 VIEW COMPARISON:  None Available. FINDINGS: Pelvis is intact with normal and symmetric sacroiliac  joints. No acute fracture or dislocation. No aggressive osseous lesion. Visualized sacral arcuate lines are unremarkable. Unremarkable symphysis pubis. There are mild degenerative changes of bilateral hip joints without significant joint space narrowing. Osteophytosis of the superior acetabulum. No radiopaque foreign bodies. IMPRESSION: *No acute osseous abnormality of the pelvis. Electronically Signed   By: Ree Molt M.D.   On: 08/07/2023 11:50     Procedures   Medications Ordered in the ED - No data to display  Clinical Course as of 08/07/23 1257  Fri Aug 07, 2023  1231 No acute traumatic injury on imaging, labs within normal range. Patient is stable for discharge home with continued symptomatic treatment and outpatient follow up. [VK]    Clinical Course User Index [VK] Kingsley, Ladawna Walgren K, DO                                 Medical Decision Making This patient presents to  the ED with chief complaint(s) of syncope, fall with pertinent past medical history of cognitive decline, scleroderma which further complicates the presenting complaint. The complaint involves an extensive differential diagnosis and also carries with it a high risk of complications and morbidity.    The differential diagnosis includes ICH, mass effect, cervical spine fracture, pelvis or sacrum fracture, facial fracture, no other traumatic injury seen, arrhythmia, anemia, dehydration, electrolyte abnormality, orthostatic hypotension, vasovagal syncope   Additional history obtained: Additional history obtained from N/A Records reviewed Care Everywhere/External Records  ED Course and Reassessment: On patient's arrival she is hemodynamically stable in no acute distress. Does have bruising to R eye and R forearm. Will have imaging to evaluate for traumatic injury and EKG and labs to evaluate for cause of her syncope. Declined any additional pain control.  Independent labs interpretation:  The following labs were  independently interpreted: within normal range  Independent visualization of imaging: - I independently visualized the following imaging with scope of interpretation limited to determining acute life threatening conditions related to emergency care: CTH/C-spine/Max face, pelvis xr, which revealed no acute traumatic injury  Consultation: - Consulted or discussed management/test interpretation w/ external professional: N/A  Consideration for admission or further workup: Patient has no emergent conditions requiring admission or further work-up at this time and is stable for discharge home with primary care follow-up  Social Determinants of health: N/A    Amount and/or Complexity of Data Reviewed Labs: ordered. Radiology: ordered.       Final diagnoses:  Syncope, unspecified syncope type  Fall, initial encounter  Contusion of face, initial encounter  Contusion of right forearm, initial encounter    ED Discharge Orders     None          Ellouise Richerd POUR, DO 08/07/23 1257

## 2023-08-07 NOTE — ED Triage Notes (Signed)
 Pt caox4, NAD c/o bruising and pain in R side of face, R arm, L buttocks, and knot on the back of the head which occurred when she had syncope on Monday. Does not take blood thinner. States she is concerned she may have fx bone in face because it is still swollen and painful.

## 2023-08-07 NOTE — Discharge Instructions (Signed)
 You were seen in the emergency department after your episode of passing out and falling.  Your workup showed no broken bones or internal bleeding and your blood work and EKG were normal.  You can take Tylenol  and Motrin as needed for pain and can ice your bruises to help with the bruising and swelling.  You should follow-up with your primary doctor in the next few days to have your symptoms rechecked.  You should return to the emergency department if you are having recurrent episodes of passing out, severe chest pain, severe headaches, repetitive vomiting or any other new or concerning symptoms.

## 2023-08-18 ENCOUNTER — Emergency Department (HOSPITAL_COMMUNITY)

## 2023-08-18 ENCOUNTER — Other Ambulatory Visit: Payer: Self-pay

## 2023-08-18 ENCOUNTER — Emergency Department (HOSPITAL_COMMUNITY)
Admission: EM | Admit: 2023-08-18 | Discharge: 2023-08-18 | Disposition: A | Discharge status: Home / Self Care | Attending: Emergency Medicine | Admitting: Emergency Medicine

## 2023-08-18 DIAGNOSIS — R000 Tachycardia, unspecified: Secondary | ICD-10-CM | POA: Diagnosis not present

## 2023-08-18 DIAGNOSIS — I6782 Cerebral ischemia: Secondary | ICD-10-CM | POA: Diagnosis not present

## 2023-08-18 DIAGNOSIS — M79644 Pain in right finger(s): Secondary | ICD-10-CM | POA: Diagnosis not present

## 2023-08-18 DIAGNOSIS — R231 Pallor: Secondary | ICD-10-CM | POA: Diagnosis not present

## 2023-08-18 DIAGNOSIS — R519 Headache, unspecified: Secondary | ICD-10-CM | POA: Diagnosis not present

## 2023-08-18 DIAGNOSIS — R42 Dizziness and giddiness: Secondary | ICD-10-CM | POA: Diagnosis not present

## 2023-08-18 DIAGNOSIS — R531 Weakness: Secondary | ICD-10-CM | POA: Diagnosis not present

## 2023-08-18 DIAGNOSIS — Z8673 Personal history of transient ischemic attack (TIA), and cerebral infarction without residual deficits: Secondary | ICD-10-CM

## 2023-08-18 DIAGNOSIS — Z7982 Long term (current) use of aspirin: Secondary | ICD-10-CM | POA: Diagnosis not present

## 2023-08-18 DIAGNOSIS — M1811 Unilateral primary osteoarthritis of first carpometacarpal joint, right hand: Secondary | ICD-10-CM | POA: Diagnosis not present

## 2023-08-18 DIAGNOSIS — M79641 Pain in right hand: Secondary | ICD-10-CM | POA: Diagnosis not present

## 2023-08-18 DIAGNOSIS — Z043 Encounter for examination and observation following other accident: Secondary | ICD-10-CM | POA: Diagnosis not present

## 2023-08-18 DIAGNOSIS — R55 Syncope and collapse: Secondary | ICD-10-CM | POA: Diagnosis not present

## 2023-08-18 DIAGNOSIS — G319 Degenerative disease of nervous system, unspecified: Secondary | ICD-10-CM | POA: Diagnosis not present

## 2023-08-18 DIAGNOSIS — I6523 Occlusion and stenosis of bilateral carotid arteries: Secondary | ICD-10-CM | POA: Diagnosis not present

## 2023-08-18 LAB — BASIC METABOLIC PANEL WITH GFR
Anion gap: 10 (ref 5–15)
BUN: 11 mg/dL (ref 8–23)
CO2: 24 mmol/L (ref 22–32)
Calcium: 8.9 mg/dL (ref 8.9–10.3)
Chloride: 107 mmol/L (ref 98–111)
Creatinine, Ser: 0.78 mg/dL (ref 0.44–1.00)
GFR, Estimated: >60 mL/min (ref >60)
Glucose, Bld: 128 mg/dL — ABNORMAL HIGH (ref 70–99)
Potassium: 4.2 mmol/L (ref 3.5–5.1)
Sodium: 141 mmol/L (ref 135–145)

## 2023-08-18 LAB — CBC WITH DIFFERENTIAL/PLATELET
Abs Immature Granulocytes: 0.06 K/uL (ref 0.00–0.07)
Basophils Absolute: 0.1 K/uL (ref 0.0–0.1)
Basophils Relative: 1 %
Eosinophils Absolute: 0 K/uL (ref 0.0–0.5)
Eosinophils Relative: 0 %
HCT: 46.2 % — ABNORMAL HIGH (ref 36.0–46.0)
Hemoglobin: 14.2 g/dL (ref 12.0–15.0)
Immature Granulocytes: 1 %
Lymphocytes Relative: 9 %
Lymphs Abs: 0.8 K/uL (ref 0.7–4.0)
MCH: 31.2 pg (ref 26.0–34.0)
MCHC: 30.7 g/dL (ref 30.0–36.0)
MCV: 101.5 fL — ABNORMAL HIGH (ref 80.0–100.0)
Monocytes Absolute: 0.9 K/uL (ref 0.1–1.0)
Monocytes Relative: 9 %
Neutro Abs: 7.9 K/uL — ABNORMAL HIGH (ref 1.7–7.7)
Neutrophils Relative %: 80 %
Platelets: 287 K/uL (ref 150–400)
RBC: 4.55 MIL/uL (ref 3.87–5.11)
RDW: 13.8 % (ref 11.5–15.5)
WBC: 9.7 K/uL (ref 4.0–10.5)
nRBC: 0 % (ref 0.0–0.2)

## 2023-08-18 MED ORDER — LORAZEPAM 1 MG PO TABS: 1.0000 mg | ORAL_TABLET | ORAL | Status: DC | PRN

## 2023-08-18 MED ORDER — LACTATED RINGERS IV BOLUS
1000.0000 mL | Freq: Once | INTRAVENOUS | Status: AC
  Administered 2023-08-18: 1000 mL via INTRAVENOUS

## 2023-08-18 MED ORDER — IOHEXOL 350 MG/ML SOLN
75.0000 mL | Freq: Once | INTRAVENOUS | Status: AC | PRN
  Administered 2023-08-18: 75 mL via INTRAVENOUS

## 2023-08-18 NOTE — Discharge Instructions (Addendum)
 Your workup was reassuring.  No concerning findings on the x-ray.  Labs are reassuring.  You were given some fluids in the emergency department.  Increase your fluid intake.  Eat regular and balanced meals.  Your imaging of your brain showed a small previous stroke.  No acute stroke.  I have provided you with neurology follow-up for further management of this.  Follow-up with your primary care doctor, neurology, cardiology.  Return to Emergency Department if experience chest pain, shortness of breath, numbness or weakness in one-sided body, slurred speech, seizures, altered mentation

## 2023-08-18 NOTE — ED Provider Notes (Signed)
 Received patient in signout from previous provider pending labs, CT.  See his note.  In short, patient presents to emergency department for evaluation of syncopal episode.  She reports that she was walking quickly into a store when she started feeling off and suddenly felt the need to sit down.  She denies dizziness, chest pain, shortness of breath, lightheadedness.  She has had a similar syncopal episode 2 weeks ago with similar presentation.  Both times she skipped breakfast that she had today.  Injuries include hematoma to left head, TTP of posterior cranium, right thumb pain.  ED workup notable for no leukocytosis nor anemia.  No acute traumatic injury to right hand.  CT head shows Negative for acute intracranial hemorrhage. Possible age indeterminate hypodensity/small infarct in the right pons.  Will consult neurology regarding CT finding  Physical Exam  BP 126/80 (BP Location: Right Arm)   Pulse 74   Temp 98 F (36.7 C) (Oral)   Resp 16   Ht 5' 7 (1.702 m)   Wt 70.8 kg   SpO2 97%   BMI 24.43 kg/m   Physical Exam Vitals and nursing note reviewed.  Constitutional:      General: She is not in acute distress.    Appearance: Normal appearance.  HENT:     Head: Normocephalic and atraumatic.  Eyes:     General: Lids are normal. Vision grossly intact. No visual field deficit.    Extraocular Movements:     Right eye: Normal extraocular motion and no nystagmus.     Left eye: Normal extraocular motion and no nystagmus.     Conjunctiva/sclera: Conjunctivae normal.  Cardiovascular:     Rate and Rhythm: Normal rate.  Pulmonary:     Effort: Pulmonary effort is normal. No respiratory distress.  Musculoskeletal:     Cervical back: Normal range of motion and neck supple. No rigidity.  Skin:    Coloration: Skin is not jaundiced or pale.  Neurological:     Mental Status: She is alert and oriented to person, place, and time. Mental status is at baseline.     GCS: GCS eye  subscore is 4. GCS verbal subscore is 5. GCS motor subscore is 6.     Cranial Nerves: No cranial nerve deficit, dysarthria or facial asymmetry.     Sensory: Sensation is intact. No sensory deficit.     Motor: No weakness, tremor, atrophy, abnormal muscle tone, seizure activity or pronator drift.     Coordination: Coordination normal. Finger-Nose-Finger Test and Heel to [place] Test normal.     Gait: Gait is intact.     Comments: Grip strength equal bilaterally.  No visual disturbances.  Sensation 2/2 BUE and BLE.  Motor 5/5 of BUE and BLE.  Ambulates without difficulty, limp, nor weakness    ED Course / MDM    Medical Decision Making Amount and/or Complexity of Data Reviewed Labs: ordered. Radiology: ordered.   ***

## 2023-08-18 NOTE — ED Notes (Signed)
 C-collar removed by Tinnie PA

## 2023-08-18 NOTE — ED Notes (Signed)
 Patient transported to CT

## 2023-08-18 NOTE — ED Triage Notes (Signed)
 Patient arrives via [facility] from [place]. Fall a week ago with same symptoms without treatment. Fell in bathroom today after feeling dizzy, lost consciousness. Left sided swelling and bruising to head and right thumb pain. 20 L wrist. No thinners. C-collar in place.   100/60 - 200 NS now 130/78. HR 84 CBG 113 O2 97 on room air

## 2023-08-18 NOTE — ED Provider Notes (Signed)
 [place] EMERGENCY DEPARTMENT AT [facility] Provider Note   CSN: [id number] Arrival date & time: 08/18/23  1312     Patient presents with: Fall and Loss of Consciousness   NUR RABOLD is a 78 y.o. female.   78 year old female presents today for concern of a syncopal episode that occurred earlier while she was at a restaurant.  She states she skipped breakfast today and was going to meet up with friends for lunch.  She had not had any significant fluids either.  She had a similar episode after she skipped breakfast 2 weeks ago.  She does have a hematoma to the left side of her head.  Also complains of pain to the right thumb.  No other complaints.  The history is provided by the patient. No language interpreter was used.       Prior to Admission medications   Medication Sig Start Date End Date Taking? Authorizing Provider  aspirin  EC 81 MG tablet Take 1 tablet (81 mg total) by mouth daily. Swallow whole. Patient taking differently: Take 81 mg by mouth daily as needed. Swallow whole. 04/28/22   Okey Vina GAILS, MD  azaTHIOprine  (IMURAN ) 50 MG tablet TAKE 1 TABLET(50 MG) BY MOUTH DAILY 04/02/23   Dolphus Reiter, MD  Coenzyme Q10 (COQ10) 200 MG CAPS Take by mouth daily.    [provider]  desloratadine  (CLARINEX ) 5 MG tablet Take 1 tablet (5 mg total) by mouth daily. 03/09/23   Soldatova, Liuba, MD  donepezil (ARICEPT) 10 MG tablet 1 tablet at bedtime Orally Once a day for 30 days 06/01/23   [provider]  ezetimibe  (ZETIA ) 10 MG tablet TAKE 1 TABLET(10 MG) BY MOUTH DAILY 03/31/23   Okey Vina GAILS, MD  fluticasone  (FLONASE ) 50 MCG/ACT nasal spray Place 2 sprays into both nostrils daily. Patient taking differently: Place 2 sprays into both nostrils daily as needed. 03/09/23   Soldatova, Liuba, MD  levofloxacin (LEVAQUIN) 500 MG tablet 1 tablet Orally Once a day for 7 days 03/20/23   [provider]  levothyroxine (SYNTHROID) 50 MCG tablet Take 1  tablet by mouth every evening.  Patient not taking: Reported on 07/29/2023 09/29/18   [provider]  levothyroxine (SYNTHROID) 75 MCG tablet 1 tablet in the morning on an empty stomach Orally Once a day for 90 days 06/03/23   [provider]  MAGNESIUM CITRATE PO Take 250 mg by mouth daily.    [provider]  Multiple Vitamins-Minerals (PRESERVISION AREDS 2) CAPS Take 1 capsule by mouth 2 (two) times daily.    [provider]  Nintedanib (OFEV ) 100 MG CAPS Take 1 capsule (100 mg total) by mouth 2 (two) times daily. 05/05/22   Geronimo Amel, MD  rosuvastatin  (CRESTOR ) 40 MG tablet TAKE 1 TABLET(40 MG) BY MOUTH DAILY 06/29/23   Okey Vina GAILS, MD  TURMERIC PO Take by mouth 2 (two) times daily.    [provider]  Vitamin D , Cholecalciferol, 25 MCG (1000 UT) CAPS Take 2,000 Units by mouth daily.    [provider]  Vitamin D , Ergocalciferol , (DRISDOL ) 1.25 MG (50000 UNIT) CAPS capsule Take 1 capsule (50,000 Units total) by mouth every 7 (seven) days. Patient not taking: Reported on 07/29/2023 08/19/22   Dolphus Reiter, MD    Allergies: Codeine, Morphine, and Morphine and codeine    Review of Systems  Constitutional:  Negative for chills and fever.  Respiratory:  Negative for shortness of breath.   Cardiovascular:  Negative for  chest pain.  Neurological:  Positive for syncope and light-headedness.  All other systems reviewed and are negative.   Updated Vital Signs BP 126/80 (BP Location: Right Arm)   Pulse 74   Temp 98 F (36.7 C) (Oral)   Resp 16   Ht 5' 7 (1.702 m)   Wt 70.8 kg   SpO2 97%   BMI 24.43 kg/m   Physical Exam Vitals and nursing note reviewed.  Constitutional:      General: She is not in acute distress.    Appearance: Normal appearance. She is not ill-appearing.  HENT:     Head: Normocephalic and atraumatic.     Nose: Nose normal.  Eyes:     Extraocular Movements: Extraocular movements intact.      Conjunctiva/sclera: Conjunctivae normal.  Cardiovascular:     Rate and Rhythm: Normal rate and regular rhythm.  Pulmonary:     Effort: Pulmonary effort is normal. No respiratory distress.     Breath sounds: No wheezing.  Musculoskeletal:        General: No deformity. Normal range of motion.     Cervical back: Normal range of motion.     Comments: Tenderness over the right thumb.  T-spine and L-spine without tenderness to palpation.  full range of motion of bilateral upper and lower extremities with 5/5 strength in extensor and flexor muscle groups.   Skin:    Findings: No rash.  Neurological:     Mental Status: She is alert.     (all labs ordered are listed, but only abnormal results are displayed) Labs Reviewed  CBC WITH DIFFERENTIAL/PLATELET  BASIC METABOLIC PANEL WITH GFR    EKG: EKG Interpretation Date/Time:  Tuesday August 18 2023 13:59:54 EDT Ventricular Rate:  78 PR Interval:  162 QRS Duration:  72 QT Interval:  392 QTC Calculation: 446 R Axis:   71  Text Interpretation: Sinus rhythm with Premature supraventricular complexes Cannot rule out Anterior infarct , age undetermined Abnormal ECG When compared with ECG of 07-Aug-2023 11:55, No significant change since last tracing Confirmed by Dreama Longs ([id number]) on 08/18/2023 2:18:50 PM  Radiology: No results found.   Procedures   Medications Ordered in the ED  lactated ringers  bolus 1,000 mL (1,000 mLs Intravenous New Bag/Given 08/18/23 1353)                                    Medical Decision Making Amount and/or Complexity of Data Reviewed Labs: ordered. Radiology: ordered.   Medical Decision Making / ED Course   This patient presents to the ED for concern of syncope, this involves an extensive number of treatment options, and is a complaint that carries with it a high risk of complications and morbidity.  The differential diagnosis includes vasovagal, orthostasis, arrhythmia  MDM: 78 year old female  presents today for concern of syncope.  This occurred after skipping her breakfast today.  She correlates her syncopal episode given that this was a second 1 when she skipped her breakfast in the past couple weeks.  Not on blood thinning medicine.  Only complaint left head pain/hematoma and right thumb pain. CT head, CT C-spine, right hand x-ray ordered. Basic blood work ordered.  EKG ordered.  Will provide fluids.  She is okay to eat. EKG without acute ischemic change. Without acute concern.  Signout to oncoming provider pending CT imaging and blood work.  If these are reassuring she is stable for discharge.  Lab Tests: -I ordered, reviewed, and interpreted labs.   The pertinent results include:   Labs Reviewed  CBC WITH DIFFERENTIAL/PLATELET  BASIC METABOLIC PANEL WITH GFR      EKG  EKG Interpretation Date/Time:  Tuesday August 18 2023 13:59:54 EDT Ventricular Rate:  78 PR Interval:  162 QRS Duration:  72 QT Interval:  392 QTC Calculation: 446 R Axis:   71  Text Interpretation: Sinus rhythm with Premature supraventricular complexes Cannot rule out Anterior infarct , age undetermined Abnormal ECG When compared with ECG of 07-Aug-2023 11:55, No significant change since last tracing Confirmed by Dreama Longs ([id number]) on 08/18/2023 2:18:50 PM         Imaging Studies ordered: I ordered imaging studies including CT head, CT C-spine, right hand x-ray I independently visualized and interpreted imaging. I agree with the radiologist interpretation   Medicines ordered and prescription drug management: Meds ordered this encounter  Medications   lactated ringers  bolus 1,000 mL    -I have reviewed the patients home medicines and have made adjustments as needed   Co morbidities that complicate the patient evaluation  Past Medical History:  Diagnosis Date   Atherosclerosis    aorta and coronary artery   Atrophic vaginitis    Cancer (HCC) 1995   Colon cancer tx surgery  and chemo   Contact lens/glasses fitting    wears contacts or glasses   Cystocele    Nasal fracture    Osteopenia 06/2017   T score -2.0 FRAX 13% / 2.8%   PONV (postoperative nausea and vomiting)    Raynaud's disease    Rectocele    Rosacea    Scleroderma (HCC)    Urinary frequency       Dispostion: Signed out to oncoming provider.    Final diagnoses:  Syncope and collapse    ED Discharge Orders     None          Hildegard Loge, PA-C 08/18/23 1502    Dreama Longs, MD 08/21/23 [id number]

## 2023-08-24 ENCOUNTER — Ambulatory Visit: Admitting: Physician Assistant

## 2023-08-24 ENCOUNTER — Encounter: Payer: Self-pay | Admitting: Physician Assistant

## 2023-08-24 VITALS — BP 132/82 | HR 93 | Ht 67.0 in | Wt 156.0 lb

## 2023-08-24 DIAGNOSIS — G3184 Mild cognitive impairment, so stated: Secondary | ICD-10-CM

## 2023-08-24 NOTE — Progress Notes (Signed)
 Assessment/Plan:   Mild Cognitive Impairment of unclear etiology   Lindsay Herman is a very pleasant 78 y.o. RH female with a history of scleroderma, hypothyroidism, hyperlipidemia, chronic RA on Imuran , ILD likely from scleroderma, history of colon cancer s/p chemotherapy seen today in follow up for memory loss.  Patient has not been seen since May 2023, canceling several appointments.  During her last visit she was diagnosed with mild cognitive impairment of unclear etiology. Memory is stable, with MoCA today 20/30. Patient is able to participate on ADLs and to drive without difficulties. Mood is very anxious, which probably may have affected her score      Follow up in 1 year Recommend good control of her cardiovascular risk factors. Baby ASA a day. Follow-up with cardiology.  Recommend checking  B12, B1   and recommend  Vit D replenishment  Continue to control mood as per PCP   Incidental Remote Chronic Cortical Stroke per Imaging   Patient presented on 08/07/2023 to the ED, after experiencing a syncopal episode while shopping (she had not had any food consumption in the morning, having skipped breakfast).  She sustained right infraorbital contusion with mild swelling and tenderness to palpation and some bruising in the right forearm.  EKG without acute abnormalities.  MRI of the brain at the time remarkable for small remote cortical infarct involving the posterior left frontal lobe, minor chronic microvascular ischemic changes for age, generalized age-related cerebral atrophy.  She has known history of heterogeneous parotid glands due to chronic inflammation (seen which is seen on Sjogren's Syndrome) No further workup was done during the hospitalization, she was discharged in stable condition.  She had a second syncopal episode on 08/18/2023 while she was at the restaurant (she had skipped breakfast), sustaining a fall hitting left posterior cranium.  Received IV fluids and food likely  recovering. EKG without changes since the last tracing, normal QT and QTc, some PSVT's. CT of the head showed negative for acute intracranial hemorrhage, possible indeterminant hypodensity-small infarct in the right pons Negative CTA for large vessel occlusion or other emergent finding. No aneurysm.. Irregularity about the visualized distal cervical ICAs bilaterally, suggestive of FMD  was noted, as well as mild atheromatous change about the carotid siphons without stenosis.   Recommendations  She has an appointment with cardiology in view of syncopal episodes, at which time she will likely other workup including 2D echo.  She may need 30-day Holter monitor as well Continue secondary stroke prevention  Continue baby aspirin  daily    Subjective:    This patient is accompanied in the office by her friend who supplements the history.  Previous records as well as any outside records available were reviewed prior to todays visit. Patient was last seen on 06/28/2021 with MoCA 20/30    Any changes in memory since last visit? About the same.  She enjoys reading.crossword puzzle ans wordfinding. She also spending time with her husband who is chronically ill (she is the main caregiver) repeats oneself?  Endorsed Disoriented when walking into a room? Denies    Leaving objects?  May misplace things but not in unusual places   Wandering behavior?  denies   Any personality changes since last visit?  Denies.   Any worsening depression?:  Denies.   Hallucinations or paranoia?  Denies.   Seizures? denies    Any sleep changes?  Sleeps fairly well. Denies vivid dreams, REM behavior or sleepwalking   Sleep apnea?   Denies.   Any  hygiene concerns? Denies.  Independent of bathing and dressing?  Endorsed  Does the patient needs help with medications? Patient  is in charge   Who is in charge of the finances?  [name]  is in charge     Any changes in appetite?  Denies.     Patient have trouble swallowing?  Denies.   Does the patient cook? No Any headaches?   denies   Any vision changes? Denies  Chronic back pain  denies   Ambulates with difficulty?  Used to participate on an exercise program, has not done so for the last year Recent falls or head injuries? Denies.     Unilateral weakness, numbness or tingling? denies   Any tremors?  Denies    Any anosmia?  Denies   Any incontinence of urine?  Endorsed, nocturia (about 5 times at night)   Any bowel dysfunction?   Denies      Patient lives with her husband  Does the patient drive?  Yes, denies any issues.  Uses a GPS if needed   Initial visit 06/28/2021 How long did patient have memory difficulties?  I had memory problems over the last 15 years however, he has been worse over the last 5 years, I brushed it off for years .  It is just names and words, but they come to me later .  She states that there is no learning disabilities that she is aware of.  Reading and literature are my strengths, math has always been an issue .  She enjoys doing crossword puzzles and word finding. Patient lives with: Spouse he did not notice problems  repeats oneself? Denies  Disoriented when walking into a room?  Patient denies   Leaving objects in unusual places?  Patient denies   Ambulates  with difficulty?   Patient denies .  During the COVID pandemic, she has not been very active, and is planning to join an exercise program soon. Recent falls?  Patient denies   Any head injuries?  Patient denies   History of seizures?   Patient denies   Wandering behavior?  Patient denies   Patient drives? No issues driving Patient uses GPS for long distances Any mood changes such irritability agitation?  Patient denies   Any history of depression?:  Patient denies   Hallucinations?  Patient denies   Paranoia?  Patient denies   Patient reports that he sleeps well without vivid dreams, REM behavior or sleepwalking   History of sleep apnea?  Patient denies   Any  hygiene concerns?  Patient denies   Independent of bathing and dressing?  Endorsed  Does the patient needs help with medications? Patient denies   Who is in charge of the finances?  Patient is in charge Any changes in appetite?  Patient denies   Patient have trouble swallowing? Patient denies   Does the patient cook?  Patient cooks every night  Any kitchen accidents such as leaving the stove on? Patient denies   Any headaches?  Patient denies   The double vision? Patient denies   Any focal numbness or tingling?  Patient denies   Chronic back pain Patient denies   Unilateral weakness?  Patient denies   Any tremors?  Patient denies   Any history of anosmia?  My sense of smell is not as strong as before for at least 6 months. No issues with sense of taste  Any incontinence of urine? History of intermittent urinary frequency   Any bowel dysfunction?  Patient denies History of heavy alcohol  intake?  Patient denies   History of heavy tobacco use?  Patient denies   Family history of dementia?  Patient denies      Latest MRI of the brain 08/18/2023, personally reviewed remarkable for small remote cortical infarct involving the posterior left frontal lobe, minor chronic microvascular ischemic changes for age, generalized age-related cerebral atrophy.  She has known history of heterogeneous parotid glands due to chronic inflammation (seen which is seen on Sjogren's Syndrome)   PREVIOUS MEDICATIONS:   CURRENT MEDICATIONS:  Outpatient Encounter Medications as of 08/24/2023  Medication Sig   azaTHIOprine  (IMURAN ) 50 MG tablet TAKE 1 TABLET(50 MG) BY MOUTH DAILY   Coenzyme Q10 (COQ10) 200 MG CAPS Take by mouth daily.   desloratadine  (CLARINEX ) 5 MG tablet Take 1 tablet (5 mg total) by mouth daily.   donepezil (ARICEPT) 10 MG tablet 1 tablet at bedtime Orally Once a day for 30 days   ezetimibe  (ZETIA ) 10 MG tablet TAKE 1 TABLET(10 MG) BY MOUTH DAILY   fluticasone  (FLONASE ) 50 MCG/ACT nasal spray  Place 2 sprays into both nostrils daily.   levothyroxine (SYNTHROID) 75 MCG tablet 1 tablet in the morning on an empty stomach Orally Once a day for 90 days   MAGNESIUM CITRATE PO Take 250 mg by mouth daily.   Multiple Vitamins-Minerals (PRESERVISION AREDS 2) CAPS Take 1 capsule by mouth 2 (two) times daily.   Nintedanib (OFEV ) 100 MG CAPS Take 1 capsule (100 mg total) by mouth 2 (two) times daily.   rosuvastatin  (CRESTOR ) 40 MG tablet TAKE 1 TABLET(40 MG) BY MOUTH DAILY   TURMERIC PO Take by mouth 2 (two) times daily.   Vitamin D , Cholecalciferol, 25 MCG (1000 UT) CAPS Take 2,000 Units by mouth daily.   aspirin  EC 81 MG tablet Take 1 tablet (81 mg total) by mouth daily. Swallow whole. (Patient not taking: Reported on 08/24/2023)   levofloxacin (LEVAQUIN) 500 MG tablet 1 tablet Orally Once a day for 7 days (Patient not taking: Reported on 08/24/2023)   levothyroxine (SYNTHROID) 50 MCG tablet Take 1 tablet by mouth every evening.  (Patient not taking: Reported on 08/24/2023)   Vitamin D , Ergocalciferol , (DRISDOL ) 1.25 MG (50000 UNIT) CAPS capsule Take 1 capsule (50,000 Units total) by mouth every 7 (seven) days. (Patient not taking: Reported on 08/24/2023)   No facility-administered encounter medications on file as of 08/24/2023.        No data to display            08/24/2023    7:00 PM 12/03/2021    2:38 PM 06/30/2021   11:00 AM  Montreal Cognitive Assessment   Visuospatial/ Executive (0/5) 2 4 4   Naming (0/3) 3 3 3   Attention: Read list of digits (0/2) 2 2 2   Attention: Read list of letters (0/1) 1 1 1   Attention: Serial 7 subtraction starting at 100 (0/3) 3 2 2   Language: Repeat phrase (0/2) 2 0 0  Language : Fluency (0/1) 1 1 1   Abstraction (0/2) 0 0 0  Delayed Recall (0/5) 1 2 2   Orientation (0/6) 5 5 5   Total 20 20 20   Adjusted Score (based on education) 20 20 20     Objective:     PHYSICAL EXAMINATION:    VITALS:   Vitals:   08/24/23 1329  BP: 132/82  Pulse: 93   SpO2: 98%  Weight: 156 lb (70.8 kg)  Height: 5' 7 (1.702 m)    GEN:  The patient  appears stated age and is in NAD. HEENT:  Normocephalic, atraumatic.   Neurological examination:  General: NAD, well-groomed, appears stated age. Anxious appearing Orientation: The patient is alert. Oriented to person, place and not to date Cranial nerves: There is good facial symmetry.The speech is fluent and clear. No aphasia or dysarthria. Fund of knowledge is appropriate. Recent  memory impaired, remote memory normal. Attention and concentration are reduced. Able to name objects and repeat phrases.  Hearing is intact to conversational tone.   Sensation: Sensation is intact to light touch throughout Motor: Strength is at least antigravity x4. DTR's 2/4 in UE/LE     Movement examination: Tone: There is normal tone in the UE/LE Abnormal movements:  no tremor.  No myoclonus.  No asterixis.   Coordination:  There is no decremation with RAM's. Normal finger to nose  Gait and Station: The patient has no  difficulty arising out of a deep-seated chair without the use of the hands. The patient's stride length is good.  Gait is cautious and narrow.    Thank you for allowing us  the opportunity to participate in the care of this nice patient. Please do not hesitate to contact us  for any questions or concerns.   Total time spent on today's visit was 60 minutes dedicated to this patient today, preparing to see patient, examining the patient, ordering tests and/or medications and counseling the patient, documenting clinical information in the EHR or other health record, independently interpreting results and communicating results to the patient/family, discussing treatment and goals, answering patient's questions and coordinating care.  Cc:  Shepard Ade, MD  Camie Sevin 08/24/2023 7:29 PM

## 2023-08-24 NOTE — Patient Instructions (Addendum)
 It was a pleasure to see you today at our office.   Recommendations:   Follow up with cardiology referral   Follow up on  parotid glands growth Check labs today Recommend checking the B1 an B12, replenish D  Take a baby aspirin  a day  Follow up in  1 year   Whom to call:  Memory  decline, memory medications: Call out office [phone]   For psychiatric meds, mood meds: Please have your primary care physician manage these medications.    For assessment of decision of mental capacity and competency:  Call Dr. Rosaline Nine, geriatric psychiatrist at [phone]  For guidance in geriatric dementia issues please call [facility] [phone]  If you have any severe symptoms of a stroke, or other severe issues such as confusion,severe chills or fever, etc call 911 or go to the ER as you may need to be evaluate further     RECOMMENDATIONS FOR ALL PATIENTS WITH MEMORY PROBLEMS: 1. Continue to exercise (Recommend 30 minutes of walking everyday, or 3 hours every week) 2. Increase social interactions - continue going to [place] and enjoy social gatherings with friends and family 3. Eat healthy, avoid fried foods and eat more fruits and vegetables 4. Maintain adequate blood pressure, blood sugar, and blood cholesterol level. Reducing the risk of stroke and cardiovascular disease also helps promoting better memory. 5. Avoid stressful situations. Live a simple life and avoid aggravations. Organize your time and prepare for the next day in anticipation. 6. Sleep well, avoid any interruptions of sleep and avoid any distractions in the bedroom that may interfere with adequate sleep quality 7. Avoid sugar, avoid sweets as there is a strong link between excessive sugar intake, diabetes, and cognitive impairment We discussed the Mediterranean diet, which has been shown to help patients reduce the risk of progressive memory disorders and reduces cardiovascular risk. This includes eating  fish, eat fruits and green leafy vegetables, nuts like almonds and hazelnuts, walnuts, and also use olive oil. Avoid fast foods and fried foods as much as possible. Avoid sweets and sugar as sugar use has been linked to worsening of memory function.  There is always a concern of gradual progression of memory problems. If this is the case, then we may need to adjust level of care according to patient needs. Support, both to the patient and caregiver, should then be put into place.      You have been referred for a neuropsychological evaluation (i.e., evaluation of memory and thinking abilities). Please bring someone with you to this appointment if possible, as it is helpful for the doctor to hear from both you and another adult who knows you well. Please bring eyeglasses and hearing aids if you wear them.    The evaluation will take approximately 3 hours and has two parts:   The first part is a clinical interview with the neuropsychologist (Dr. Richie or Dr. Jackquline). During the interview, the neuropsychologist will speak with you and the individual you brought to the appointment.    The second part of the evaluation is testing with the doctor's technician Neal or Luke). During the testing, the technician will ask you to remember different types of material, solve problems, and answer some questionnaires. Your family member will not be present for this portion of the evaluation.   Please note: We must reserve several hours of the neuropsychologist's time and the psychometrician's time for your evaluation appointment. As such, there is a No-Show fee of $100.  If you are unable to attend any of your appointments, please contact our office as soon as possible to reschedule.    FALL PRECAUTIONS: Be cautious when walking. Scan the area for obstacles that may increase the risk of trips and falls. When getting up in the mornings, sit up at the edge of the bed for a few minutes before getting out of bed.  Consider elevating the bed at the head end to avoid drop of blood pressure when getting up. Walk always in a well-lit room (use night lights in the walls). Avoid area rugs or power cords from appliances in the middle of the walkways. Use a walker or a cane if necessary and consider physical therapy for balance exercise. Get your eyesight checked regularly.  FINANCIAL OVERSIGHT: Supervision, especially oversight when making financial decisions or transactions is also recommended.  HOME SAFETY: Consider the safety of the kitchen when operating appliances like stoves, microwave oven, and blender. Consider having supervision and share cooking responsibilities until no longer able to participate in those. Accidents with firearms and other hazards in the house should be identified and addressed as well.   ABILITY TO BE LEFT ALONE: If patient is unable to contact 911 operator, consider using [facility], or when the need is there, arrange for someone to stay with patients. Smoking is a fire hazard, consider supervision or cessation. Risk of wandering should be assessed by caregiver and if detected at any point, supervision and safe proof recommendations should be instituted.  MEDICATION SUPERVISION: Inability to self-administer medication needs to be constantly addressed. Implement a mechanism to ensure safe administration of the medications.   DRIVING: Regarding driving, in patients with progressive memory problems, driving will be impaired. We advise to have someone else do the driving if trouble finding directions or if minor accidents are reported. Independent driving assessment is available to determine safety of driving.   If you are interested in the driving assessment, you can contact the following:  [facility] [employer] in [place] [phone]  [facility] [phone]  [facility] [phone] or  [phone]    Mediterranean Diet A Mediterranean diet refers to food and lifestyle choices that are based on the traditions of countries located on the [employer]. This way of eating has been shown to help prevent certain conditions and improve outcomes for people who have chronic diseases, like kidney disease and heart disease. What are tips for following this plan? Lifestyle  Cook and eat meals together with your family, when possible. Drink enough fluid to keep your urine clear or pale yellow. Be physically active every day. This includes: Aerobic exercise like running or swimming. Leisure activities like gardening, walking, or housework. Get 7-8 hours of sleep each night. If recommended by your health care provider, drink red wine in moderation. This means 1 glass a day for nonpregnant women and 2 glasses a day for men. A glass of wine equals 5 oz (150 mL). Reading food labels  Check the serving size of packaged foods. For foods such as rice and pasta, the serving size refers to the amount of cooked product, not dry. Check the total fat in packaged foods. Avoid foods that have saturated fat or trans fats. Check the ingredients list for added sugars, such as corn syrup. Shopping  At the grocery store, buy most of your food from the areas near the walls of the store. This includes: Fresh fruits and vegetables (produce). Grains, beans, nuts, and seeds. Some  of these may be available in unpackaged forms or large amounts (in bulk). Fresh seafood. Poultry and eggs. Low-fat dairy products. Buy whole ingredients instead of prepackaged foods. Buy fresh fruits and vegetables in-season from local farmers markets. Buy frozen fruits and vegetables in resealable bags. If you do not have access to quality fresh seafood, buy precooked frozen shrimp or canned fish, such as tuna, salmon, or sardines. Buy small amounts of raw or cooked vegetables, salads, or olives from the deli or salad bar  at your store. Stock your pantry so you always have certain foods on hand, such as olive oil, canned tuna, canned tomatoes, rice, pasta, and beans. Cooking  Cook foods with extra-virgin olive oil instead of using butter or other vegetable oils. Have meat as a side dish, and have vegetables or grains as your main dish. This means having meat in small portions or adding small amounts of meat to foods like pasta or stew. Use beans or vegetables instead of meat in common dishes like chili or lasagna. Experiment with different cooking methods. Try roasting or broiling vegetables instead of steaming or sauteing them. Add frozen vegetables to soups, stews, pasta, or rice. Add nuts or seeds for added healthy fat at each meal. You can add these to yogurt, salads, or vegetable dishes. Marinate fish or vegetables using olive oil, lemon juice, garlic, and fresh herbs. Meal planning  Plan to eat 1 vegetarian meal one day each week. Try to work up to 2 vegetarian meals, if possible. Eat seafood 2 or more times a week. Have healthy snacks readily available, such as: Vegetable sticks with hummus. Greek yogurt. Fruit and nut trail mix. Eat balanced meals throughout the week. This includes: Fruit: 2-3 servings a day Vegetables: 4-5 servings a day Low-fat dairy: 2 servings a day Fish, poultry, or lean meat: 1 serving a day Beans and legumes: 2 or more servings a week Nuts and seeds: 1-2 servings a day Whole grains: 6-8 servings a day Extra-virgin olive oil: 3-4 servings a day Limit red meat and sweets to only a few servings a month What are my food choices? Mediterranean diet Recommended Grains: Whole-grain pasta. Brown rice. Bulgar wheat. Polenta. Couscous. Whole-wheat bread. [place] Made[place]. Vegetables: Artichokes. Beets. Broccoli. Cabbage. Carrots. Eggplant. Green beans. Chard. Kale. Spinach. Onions. Leeks. Peas. Squash. Tomatoes. Peppers. Radishes. Fruits: Apples. Apricots. Avocado. Berries.  Bananas. Cherries. Dates. Figs. Grapes. Lemons. Melon. Oranges. Peaches. Plums. Pomegranate. Meats and other protein foods: Beans. Almonds. Sunflower seeds. Pine nuts. Peanuts. Cod. Salmon. Scallops. Shrimp. Tuna. Tilapia. Clams. Oysters. Eggs. Dairy: Low-fat milk. Cheese. Greek yogurt. Beverages: Water. Red wine. Herbal tea. Fats and oils: Extra virgin olive oil. Avocado oil. Grape seed oil. Sweets and desserts: [place] yogurt with honey. Baked apples. Poached pears. Trail mix. Seasoning and other foods: Basil. Cilantro. Coriander. Cumin. Mint. Parsley. Sage. Rosemary. Tarragon. Garlic. Oregano. Thyme. Pepper. Balsalmic vinegar. Tahini. Hummus. Tomato sauce. Olives. Mushrooms. Limit these Grains: Prepackaged pasta or rice dishes. Prepackaged cereal with added sugar. Vegetables: Deep fried potatoes (french fries). Fruits: Fruit canned in syrup. Meats and other protein foods: Beef. Pork. Lamb. Poultry with skin. Hot dogs. [place]. Dairy: Ice cream. Sour cream. Whole milk. Beverages: Juice. Sugar-sweetened soft drinks. Beer. Liquor and spirits. Fats and oils: Butter. Canola oil. Vegetable oil. Beef fat (tallow). Lard. Sweets and desserts: Cookies. Cakes. Pies. Candy. Seasoning and other foods: Mayonnaise. Premade sauces and marinades. The items listed may not be a complete list. Talk with your dietitian about what dietary choices are right  for you. Summary The Mediterranean diet includes both food and lifestyle choices. Eat a variety of fresh fruits and vegetables, beans, nuts, seeds, and whole grains. Limit the amount of red meat and sweets that you eat. Talk with your health care provider about whether it is safe for you to drink red wine in moderation. This means 1 glass a day for nonpregnant women and 2 glasses a day for men. A glass of wine equals 5 oz (150 mL). This information is not intended to replace advice given to you by your health care provider. Make sure you discuss any questions you  have with your health care provider. Document Released: 09/20/2015 Document Revised: 10/23/2015 Document Reviewed: 09/20/2015 Elsevier Interactive Patient Education  2017 [employer].

## 2023-08-26 DIAGNOSIS — R55 Syncope and collapse: Secondary | ICD-10-CM | POA: Diagnosis not present

## 2023-08-26 DIAGNOSIS — F02A0 Dementia in other diseases classified elsewhere, mild, without behavioral disturbance, psychotic disturbance, mood disturbance, and anxiety: Secondary | ICD-10-CM | POA: Diagnosis not present

## 2023-08-26 DIAGNOSIS — J449 Chronic obstructive pulmonary disease, unspecified: Secondary | ICD-10-CM | POA: Diagnosis not present

## 2023-08-26 DIAGNOSIS — J849 Interstitial pulmonary disease, unspecified: Secondary | ICD-10-CM | POA: Diagnosis not present

## 2023-08-26 DIAGNOSIS — E039 Hypothyroidism, unspecified: Secondary | ICD-10-CM | POA: Diagnosis not present

## 2023-08-26 DIAGNOSIS — E785 Hyperlipidemia, unspecified: Secondary | ICD-10-CM | POA: Diagnosis not present

## 2023-08-26 DIAGNOSIS — Z85038 Personal history of other malignant neoplasm of large intestine: Secondary | ICD-10-CM | POA: Diagnosis not present

## 2023-09-02 ENCOUNTER — Ambulatory Visit: Admitting: Physician Assistant

## 2023-09-04 ENCOUNTER — Ambulatory Visit (HOSPITAL_BASED_OUTPATIENT_CLINIC_OR_DEPARTMENT_OTHER): Admitting: Family

## 2023-10-07 DIAGNOSIS — Z85828 Personal history of other malignant neoplasm of skin: Secondary | ICD-10-CM | POA: Diagnosis not present

## 2023-10-07 DIAGNOSIS — D2261 Melanocytic nevi of right upper limb, including shoulder: Secondary | ICD-10-CM | POA: Diagnosis not present

## 2023-10-07 DIAGNOSIS — L821 Other seborrheic keratosis: Secondary | ICD-10-CM | POA: Diagnosis not present

## 2023-10-07 DIAGNOSIS — L814 Other melanin hyperpigmentation: Secondary | ICD-10-CM | POA: Diagnosis not present

## 2023-10-07 DIAGNOSIS — L853 Xerosis cutis: Secondary | ICD-10-CM | POA: Diagnosis not present

## 2023-10-07 DIAGNOSIS — M71341 Other bursal cyst, right hand: Secondary | ICD-10-CM | POA: Diagnosis not present

## 2023-10-27 DIAGNOSIS — Z79899 Other long term (current) drug therapy: Secondary | ICD-10-CM | POA: Diagnosis not present

## 2023-10-27 DIAGNOSIS — H524 Presbyopia: Secondary | ICD-10-CM | POA: Diagnosis not present

## 2023-10-27 DIAGNOSIS — H35363 Drusen (degenerative) of macula, bilateral: Secondary | ICD-10-CM | POA: Diagnosis not present

## 2023-11-04 DIAGNOSIS — E039 Hypothyroidism, unspecified: Secondary | ICD-10-CM | POA: Diagnosis not present

## 2023-11-04 DIAGNOSIS — D649 Anemia, unspecified: Secondary | ICD-10-CM | POA: Diagnosis not present

## 2023-11-04 DIAGNOSIS — Z1212 Encounter for screening for malignant neoplasm of rectum: Secondary | ICD-10-CM | POA: Diagnosis not present

## 2023-11-05 DIAGNOSIS — I10 Essential (primary) hypertension: Secondary | ICD-10-CM | POA: Diagnosis not present

## 2023-11-05 DIAGNOSIS — E039 Hypothyroidism, unspecified: Secondary | ICD-10-CM | POA: Diagnosis not present

## 2023-11-05 DIAGNOSIS — E785 Hyperlipidemia, unspecified: Secondary | ICD-10-CM | POA: Diagnosis not present

## 2023-11-08 NOTE — Progress Notes (Unsigned)
 Cardiology Office Note:    Date:  11/09/2023   ID:  Lindsay Herman, DOB [birth year], MRN [id number]  PCP:  Shepard Ade, MD   [facility] Providers Cardiologist:  Vina Gull, MD     Referring MD: Shepard Ade, MD   Chief complaint: Follow-up syncopal episode  History of Present Illness:    Lindsay Herman is a 78 y.o. female with a hx of scleroderma, seronegative RA, and GERD presenting to the clinic today following 2 episodes of syncope that happened in June and July 2025.  Previously referred to our service in 2018 for rule out of pulmonary artery hypertension, no evidence of PAH on echo.  CT chest on 10/19/2017 showed aortic atherosclerosis with atherosclerosis of the great vessels of the mediastinum as well as the coronary arteries including calcified plaque in the left main and LAD that was listed as mild. Maintained yearly follow-up with cardiology and did not have further symptoms warranting an ischemic workup.  Most recent echo 09/10/2021 ordered by pulmonology: LVEF 60-65%, normal LV function, no RWMA, G1 DD.  Normal RV.  Normal mitral valve.  Mild calcification of the aortic valve, no evidence of aortic valve stenosis.  RA pressure 3 mmHg.  08/07/2023 patient was shopping when she began to feel unwell, felt nauseous, stated she needed to sit down, then passed out for a brief period of time.  Went to the ED, labs and imaging were unremarkable, no troponins ordered.  EKG showed 67 bpm, sinus rhythm with nonspecific T wave inversion, right axis deviation.  08/18/2023 patient presented for another syncopal episode that occurred while she was out with friends for lunch.  EKG showed sinus rhythm at 78 bpm.  Labs were unremarkable, no troponin ordered.  CT head shows negative for acute intracranial hemorrhage.  Possible age-indeterminate hypodensity/small infarct in the right pons.  Neurology was consulted who recommended an MRI, which showed remote infarct.  Was discharged to  follow-up with neurology.  Patient saw neurology on 08/24/2023, who recommended a baby aspirin  a day and to follow-up with cardiology.  Patient presents the clinic today, is very pleasant, appears stable from a cardiovascular standpoint. She denies chest pain, palpitations, dyspnea, pnd, orthopnea, n, v, dizziness, recent syncope, edema, weight gain, or early satiety.  States that she did not see any improvement in her daily functioning on any of her medications, so the only one that she has been taking has been her Aricept.  Patient could not recall having had an echo, could not recall seeing her pulmonologist 2 years prior, and could not recall having visited the ED after her first first syncopal episode in June.  She was able to describe the events immediately surrounding her episodes of passing out.  Does not recall having a headache, blurred vision, nausea/vomiting, loss of bowel/bladder function during these episodes.  States neither morning she had not had anything to eat or drink prior to passing out.  States she knows she does not drink enough water on a daily basis because she often forgets to do so.   Past Medical History:  Diagnosis Date   Atherosclerosis    aorta and coronary artery   Atrophic vaginitis    Cancer (HCC) 1995   Colon cancer tx surgery and chemo   Contact lens/glasses fitting    wears contacts or glasses   Cystocele    Nasal fracture    Osteopenia 06/2017   T score -2.0 FRAX 13% / 2.8%   PONV (postoperative nausea and  vomiting)    Raynaud's disease    Rectocele    Rosacea    Scleroderma (HCC)    Urinary frequency     Past Surgical History:  Procedure Laterality Date   APPENDECTOMY     colon cancer  1995   COLON RESECTION  1995   KNEE ARTHROSCOPY Left 01/14/2019   Procedure: ARTHROSCOPY KNEE, PARTIAL MENISCECTOMY, CHONDROPLASTY AND LOOSE BODY REMOVAL;  Surgeon: Yvone Rush, MD;  Location: [facility] [place];  Service: Orthopedics;  Laterality: Left;    MASS EXCISION Left 08/31/2012   Procedure: EXCISION MUCOID CYST DEBRIDEMENT DISTAL INTERPHALANGEAL LEFT MIDDLE FINGER;  Surgeon: Arley JONELLE Curia, MD;  Location: [facility];  Service: Orthopedics;  Laterality: Left;   OOPHORECTOMY     LSO   ROTATOR CUFF REPAIR Right    TONSILLECTOMY     VAGINAL HYSTERECTOMY  1994    Current Medications: Current Meds  Medication Sig   azaTHIOprine  (IMURAN ) 50 MG tablet TAKE 1 TABLET(50 MG) BY MOUTH DAILY   donepezil (ARICEPT) 10 MG tablet 1 tablet at bedtime Orally Once a day for 30 days   TURMERIC PO Take by mouth 2 (two) times daily.   Vitamin D , Cholecalciferol, 25 MCG (1000 UT) CAPS Take 2,000 Units by mouth daily.     Allergies:   Codeine, Morphine, and Morphine and codeine   Social History   Socioeconomic History   Marital status: Married    Spouse name: Not on file   Number of children: 2   Years of education: 14   Highest education level: Not on file  Occupational History   Not on file  Tobacco Use   Smoking status: Former    Current packs/day: 0.00    Average packs/day: 0.5 packs/day for 9.5 years (4.8 ttl pk-yrs)    Types: Cigarettes    Start date: 02/11/1963    Quit date: 08/25/1972    Years since quitting: 51.2    Passive exposure: Current (minimal)   Smokeless tobacco: Never  Vaping Use   Vaping status: Never Used  Substance and Sexual Activity   Alcohol  use: Yes    Alcohol /week: 3.0 standard drinks of alcohol     Types: 3 Glasses of wine per week    Comment: 1/3 cup nightly   Drug use: Never   Sexual activity: Never    Birth control/protection: Surgical    Comment: 1st intercourse 78 yo-Fewer than 5 partners  Other Topics Concern   Not on file  Social History Narrative   Right handed   Drinks caffeine   Two story home   Lives with husband   Social Drivers of Corporate investment banker Strain: Not on file  Food Insecurity: Not on file  Transportation Needs: Not on file  Physical Activity: Not on  file  Stress: Not on file  Social Connections: Not on file     Family History: The patient's family history includes Diabetes in her brother and mother; Heart disease in her mother; Hypertension in her brother; Lupus in her father; Stroke in her mother.  ROS:   Please see the history of present illness.     All other systems reviewed and are negative.  EKGs/Labs/Other Studies Reviewed:    The following studies were reviewed today:  EKG Interpretation Date/Time:  Monday November 09 2023 14:43:42 EDT Ventricular Rate:  73 PR Interval:  152 QRS Duration:  72 QT Interval:  376 QTC Calculation: 414 R Axis:   102  Text Interpretation: Normal sinus  rhythm Possible Right ventricular hypertrophy Confirmed by Ren Grasse [phone]) on 11/09/2023 2:50:03 PM    Recent Labs: 07/29/2023: ALT 15 08/18/2023: BUN 11; Creatinine, Ser 0.78; Hemoglobin 14.2; Platelets 287; Potassium 4.2; Sodium 141  Recent Lipid Panel    Component Value Date/Time   CHOL 150 03/27/2021 1001   TRIG 72 03/27/2021 1001   HDL 65 03/27/2021 1001   CHOLHDL 2.3 03/27/2021 1001   LDLCALC 71 03/27/2021 1001     Physical Exam:    VS:  Ht 5' 7 (1.702 m)   Wt 158 lb (71.7 kg)   SpO2 97%   BMI 24.75 kg/m     Wt Readings from Last 3 Encounters:  11/09/23 158 lb (71.7 kg)  08/24/23 156 lb (70.8 kg)  08/18/23 156 lb (70.8 kg)    GEN:  Well nourished, well developed in no acute distress HEENT: Normal NECK: No carotid bruits CARDIAC: RRR, no murmurs, rubs, gallops RESPIRATORY:  Clear to auscultation without rales, wheezing or rhonchi  MUSCULOSKELETAL:  No edema; No deformity  SKIN: Warm and dry NEUROLOGIC:  Alert and oriented x 3 PSYCHIATRIC:  Normal affect   ASSESSMENT:    1. Syncope and collapse   2. Hyperlipidemia, unspecified hyperlipidemia type    PLAN:    In order of problems listed above:  Syncopal event Orthostatic hypotension EKG: NSR, 73 bpm, possible RVH Denies chest pain, shortness  of breath, DOE, orthopnea, palpitations, leg swelling TSH 11/04/2023: 5.24, cannot see PCP note d/t not in Care Everywhere, patient cannot recall whether she is taking Synthroid, stated she stopped all meds aside from Aricept Will order a 30-day Holter monitor to evaluate for arrhythmias Will repeat echo Advised patient she needed to continue ASA EC 81 mg daily Will order carotid Dopplers to rule out carotid stenosis Orthostatic vital signs were positive, with a 26 mmHg drop in systolic pressure from lying to sitting. Orthostatic precautions were advised with compression stockings and abdominal binders, recommending increasing fluid intake. If above testing is negative we will consider ischemic workup with coronary CTA Discussed with patient [place] DMV medical guidelines for driving: it is prudent to recommend that all persons should be free of syncopal episodes for at least six months to be granted the driving privilege. (THE Regino Ramirez  PHYSICIAN'S GUIDE TO DRIVER MEDICAL EVALUATION, Second Edition, Medical Review Branch, Associate Professor, Division of [employer], [place]  Department of Transportation, July 2004)  Hyperlipidemia Lipid panel 04/09/2022: Cholesterol 157, HDL 68, LDL 72, triglycerides 92 Repeat fasting lipids at some point over the next week Advised patient she did need to continue taking her Zetia  10 mg daily as well as her Crestor  40 mg daily   Follow-up with MD or APP in 2 months to discuss results of testing  Medication Adjustments/Labs and Tests Ordered: Current medicines are reviewed at length with the patient today.  Concerns regarding medicines are outlined above.  Orders Placed This Encounter  Procedures   Lipid panel   CARDIAC EVENT MONITOR   EKG 12-Lead   ECHOCARDIOGRAM COMPLETE   VAS US  CAROTID   No orders of the defined types were placed in this encounter.   Patient Instructions  Medication Instructions:  Your physician recommends that you  continue on your current medications as directed. Please refer to the Current Medication list given to you today.  *If you need a refill on your cardiac medications before your next appointment, please call your pharmacy*  Lab Work: Fasting Lab work in 1 week.  Testing/Procedures: Your  physician has requested that you have an echocardiogram. Echocardiography is a painless test that uses sound waves to create images of your heart. It provides your doctor with information about the size and shape of your heart and how well your heart's chambers and valves are working. This procedure takes approximately one hour. There are no restrictions for this procedure. Please do NOT wear cologne, perfume, aftershave, or lotions (deodorant is allowed). Please arrive 15 minutes prior to your appointment time.  Please note: We ask at that you not bring children with you during ultrasound (echo/ vascular) testing. Due to room size and safety concerns, children are not allowed in the ultrasound rooms during exams. Our front office staff cannot provide observation of children in our lobby area while testing is being conducted. An adult accompanying a patient to their appointment will only be allowed in the ultrasound room at the discretion of the ultrasound technician under special circumstances. We apologize for any inconvenience.  Your physician has requested that you have a carotid duplex. This test is an ultrasound of the carotid arteries in your neck. It looks at blood flow through these arteries that supply the brain with blood. Allow one hour for this exam. There are no restrictions or special instructions.   Your physician has recommended that you wear an event monitor. Event monitors are medical devices that record the heart's electrical activity. Doctors most often us  these monitors to diagnose arrhythmias. Arrhythmias are problems with the speed or rhythm of the heartbeat. The monitor is a small, portable  device. You can wear one while you do your normal daily activities. This is usually used to diagnose what is causing palpitations/syncope (passing out).   [employer] Cardiac Event Monitor Instructions  Your physician has requested you wear your cardiac event monitor for 30 days. [employer] may call or text to confirm a shipping address. The monitor will be sent to a land address via UPS. [employer] will not ship a monitor to a PO BOX. It typically takes 3-5 days to receive your monitor after it has been enrolled. [employer] will assist with USPS tracking if your package is delayed. The telephone number for [employer] is [phone]. Once you have received your monitor, please review the enclosed instructions. Instruction tutorials can also be viewed under help and settings on the enclosed cell phone. Your monitor has already been registered assigning a specific monitor serial # to you.  Billing and Self Pay Discount Information  [employer] has been provided the insurance information we had on file for you.  If your insurance has been updated, please call [employer] at [phone] to provide them with your updated insurance information.   [employer] offers a discounted Self Pay option for patients who have insurance that does not cover their cardiac event monitor or patients without insurance.  The discounted cost of a Self Pay Cardiac Event Monitor would be $225.00 , if the patient contacts [employer] at [phone] within 7 days of applying the monitor to make payment arrangements.  If the patient does not contact [employer] within 7 days of applying the monitor, the cost of the cardiac event monitor will be $350.00.  Applying the monitor  Remove cell phone from case and turn it on. The cell phone works as IT consultant and needs to be within [employer] of you at all times. The cell phone will need to be charged on a daily basis. We recommend you plug the cell phone into the enclosed  charger at your bedside table every  night.  Monitor batteries: You will receive two monitor batteries labelled #1 and #2. These are your recorders. Plug battery #2 onto the second connection on the enclosed charger. Keep one battery on the charger at all times. This will keep the monitor battery deactivated. It will also keep it fully charged for when you need to switch your monitor batteries. A small light will be blinking on the battery emblem when it is charging. The light on the battery emblem will remain on when the battery is fully charged.  Open package of a Monitor strip. Insert battery #1 into black hood on strip and gently squeeze monitor battery onto connection as indicated in instruction booklet. Set aside while preparing skin.  Choose location for your strip, vertical or horizontal, as indicated in the instruction booklet. Shave to remove all hair from location. There cannot be any lotions, oils, powders, or colognes on skin where monitor is to be applied. Wipe skin clean with enclosed Saline wipe. Dry skin completely.  Peel paper labeled #1 off the back of the Monitor strip exposing the adhesive. Place the monitor on the chest in the vertical or horizontal position shown in the instruction booklet. One arrow on the monitor strip must be pointing upward. Carefully remove paper labeled #2, attaching remainder of strip to your skin. Try not to create any folds or wrinkles in the strip as you apply it.  Firmly press and release the circle in the center of the monitor battery. You will hear a small beep. This is turning the monitor battery on. The heart emblem on the monitor battery will light up every 5 seconds if the monitor battery in turned on and connected to the patient securely. Do not push and hold the circle down as this turns the monitor battery off. The cell phone will locate the monitor battery. A screen will appear on the cell phone checking the connection of your  monitor strip. This may read poor connection initially but change to good connection within the next minute. Once your monitor accepts the connection you will hear a series of 3 beeps followed by a climbing crescendo of beeps. A screen will appear on the cell phone showing the two monitor strip placement options. Touch the picture that demonstrates where you applied the monitor strip.  Your monitor strip and battery are waterproof. You are able to shower, bathe, or swim with the monitor on. They just ask you do not submerge deeper than 3 feet underwater. We recommend removing the monitor if you are swimming in a lake, river, or ocean.  Your monitor battery will need to be switched to a fully charged monitor battery approximately once a week. The cell phone will alert you of an action which needs to be made.  On the cell phone, tap for details to reveal connection status, monitor battery status, and cell phone battery status. The green dots indicates your monitor is in good status. A red dot indicates there is something that needs your attention.  To record a symptom, click the circle on the monitor battery. In 30-60 seconds a list of symptoms will appear on the cell phone. Select your symptom and tap save. Your monitor will record a sustained or significant arrhythmia regardless of you clicking the button. Some patients do not feel the heart rhythm irregularities. [employer] will notify us  of any serious or critical events.  Refer to instruction booklet for instructions on switching batteries, changing strips, the Do not disturb or Pause features, or  any additional questions.  Call [employer] at [phone], to confirm your monitor is transmitting and record your baseline. They will answer any questions you may have regarding the monitor instructions at that time.  Returning the monitor to [employer]  Place all equipment back into blue box. Peel off strip of paper to expose adhesive  and close box securely. There is a prepaid UPS shipping label on this box. Drop in a UPS drop box, or at a UPS facility like [employer]. You may also contact [employer] to arrange UPS to pick up monitor package at your home.   Follow-Up: At [facility], you and your health needs are our priority.  As part of our continuing mission to provide you with exceptional heart care, our providers are all part of one team.  This team includes your primary Cardiologist (physician) and Advanced Practice Providers or APPs (Physician Assistants and Nurse Practitioners) who all work together to provide you with the care you need, when you need it.  Your next appointment:   2 month(s)  Provider:   Vina Gull, MD or Miriam Shams, NP or Damien Braver, NP          We recommend signing up for the patient portal called MyChart.  Sign up information is provided on this After Visit Summary.  MyChart is used to connect with patients for Virtual Visits (Telemedicine).  Patients are able to view lab/test results, encounter notes, upcoming appointments, etc.  Non-urgent messages can be sent to your provider as well.   To learn more about what you can do with MyChart, go to [email or web address].   Other Instructions Orthostatic Hypotension Blood pressure is a measurement of how strongly, or weakly, your circulating blood is pressing against the walls of your arteries. Orthostatic hypotension is a drop in blood pressure that can happen when you change positions, such as when you go from lying down to standing. Arteries are blood vessels that carry blood from your heart throughout your body. When blood pressure is too low, you may not get enough blood to your brain or to the rest of your organs. Orthostatic hypotension can cause light-headedness, sweating, rapid heartbeat, blurred vision, and fainting. These symptoms require further investigation into the cause. What are the causes? Orthostatic hypotension can  be caused by many things, including: Sudden changes in posture, such as standing up quickly after you have been sitting or lying down. Loss of blood (anemia) or loss of body fluids (dehydration). Heart problems, neurologic problems, or hormone problems. Pregnancy. Aging. The risk for this condition increases as you get older. Severe infection (sepsis). Certain medicines, such as medicines for high blood pressure or medicines that make the body lose excess fluids (diuretics). What are the signs or symptoms? Symptoms of this condition may include: Weakness, light-headedness, or dizziness. Sweating. Blurred vision. Tiredness (fatigue). Rapid heartbeat. Fainting, in severe cases. How is this diagnosed? This condition is diagnosed based on: Your symptoms and medical history. Your blood pressure measurements. Your health care provider will check your blood pressure when you are: Lying down. Sitting. Standing. A blood pressure reading is recorded as two numbers, such as 120 over 80 (or 120/80). The first (top) number is called the systolic pressure. It is a measure of the pressure in your arteries as your heart beats. The second (bottom) number is called the diastolic pressure. It is a measure of the pressure in your arteries when your heart relaxes between beats. Blood pressure is measured in a unit called mmHg. Healthy  blood pressure for most adults is 120/80 mmHg. Orthostatic hypotension is defined as a 20 mmHg drop in systolic pressure or a 10 mmHg drop in diastolic pressure within 3 minutes of standing. Other information or tests that may be used to diagnose orthostatic hypotension include: Your other vital signs, such as your heart rate and temperature. Blood tests. An electrocardiogram (ECG) or echocardiogram. A Holter monitor. This is a device you wear that records your heart rhythm continuously, usually for 24-48 hours. Tilt table test. For this test, you will be safely secured  to a table that moves you from a lying position to an upright position. Your heart rhythm and blood pressure will be monitored during the test. How is this treated? This condition may be treated by: Changing your diet. This may involve eating more salt (sodium) or drinking more water. Changing the dosage of certain medicines you are taking that might be lowering your blood pressure. Correcting the underlying reason for the orthostatic hypotension. Wearing compression stockings. Taking medicines to raise your blood pressure. Avoiding actions that trigger symptoms. Follow these instructions at home: Medicines Take over-the-counter and prescription medicines only as told by your health care provider. Follow instructions from your health care provider about changing the dosage of your current medicines, if this applies. Do not stop or adjust any of your medicines on your own. Eating and drinking  Drink enough fluid to keep your urine pale yellow. Eat extra salt only as directed. Do not add extra salt to your diet unless advised by your health care provider. Eat frequent, small meals. Avoid standing up suddenly after eating. General instructions  Get up slowly from lying down or sitting positions. This gives your blood pressure a chance to adjust. Avoid hot showers and excessive heat as directed by your health care provider. Engage in regular physical activity as directed by your health care provider. If you have compression stockings, wear them as told. Keep all follow-up visits. This is important. Contact a health care provider if: You have a fever for more than 2-3 days. You feel more thirsty than usual. You feel dizzy or weak. Get help right away if: You have chest pain. You have a fast or irregular heartbeat. You become sweaty or feel light-headed. You feel short of breath. You faint. You have any symptoms of a stroke. BE FAST is an easy way to remember the main warning signs  of a stroke: B - Balance. Signs are dizziness, sudden trouble walking, or loss of balance. E - Eyes. Signs are trouble seeing or a sudden change in vision. F - Face. Signs are sudden weakness or numbness of the face, or the face or eyelid drooping on one side. A - Arms. Signs are weakness or numbness in an arm. This happens suddenly and usually on one side of the body. S - Speech. Signs are sudden trouble speaking, slurred speech, or trouble understanding what people say. T - Time. Time to call emergency services. Write down what time symptoms started. You have other signs of a stroke, such as: A sudden, severe headache with no known cause. Nausea or vomiting. Seizure. These symptoms may represent a serious problem that is an emergency. Do not wait to see if the symptoms will go away. Get medical help right away. Call your local emergency services (911 in the [place] Do not drive yourself to the hospital. Summary Orthostatic hypotension is a sudden drop in blood pressure. It can cause light-headedness, sweating, rapid heartbeat, blurred vision, and  fainting. Orthostatic hypotension can be diagnosed by having your blood pressure taken while lying down, sitting, and then standing. Treatment may involve changing your diet, wearing compression stockings, sitting up slowly, adjusting your medicines, or correcting the underlying reason for the orthostatic hypotension. Get help right away if you have chest pain, a fast or irregular heartbeat, or symptoms of a stroke. This information is not intended to replace advice given to you by your health care provider. Make sure you discuss any questions you have with your health care provider. Document Revised: 04/12/2020 Document Reviewed: 04/12/2020 Elsevier Patient Education  [address]           Signed, Miriam FORBES Shams, NP  11/09/2023 3:50 PM    [facility]

## 2023-11-09 ENCOUNTER — Ambulatory Visit: Attending: Nurse Practitioner | Admitting: Emergency Medicine

## 2023-11-09 ENCOUNTER — Encounter: Payer: Self-pay | Admitting: Nurse Practitioner

## 2023-11-09 VITALS — Ht 67.0 in | Wt 158.0 lb

## 2023-11-09 DIAGNOSIS — R55 Syncope and collapse: Secondary | ICD-10-CM

## 2023-11-09 DIAGNOSIS — E785 Hyperlipidemia, unspecified: Secondary | ICD-10-CM | POA: Diagnosis not present

## 2023-11-09 NOTE — Patient Instructions (Addendum)
 Medication Instructions:  Your physician recommends that you continue on your current medications as directed. Please refer to the Current Medication list given to you today.  *If you need a refill on your cardiac medications before your next appointment, please call your pharmacy*  Lab Work: Fasting Lab work in 1 week.  Testing/Procedures: Your physician has requested that you have an echocardiogram. Echocardiography is a painless test that uses sound waves to create images of your heart. It provides your doctor with information about the size and shape of your heart and how well your heart's chambers and valves are working. This procedure takes approximately one hour. There are no restrictions for this procedure. Please do NOT wear cologne, perfume, aftershave, or lotions (deodorant is allowed). Please arrive 15 minutes prior to your appointment time.  Please note: We ask at that you not bring children with you during ultrasound (echo/ vascular) testing. Due to room size and safety concerns, children are not allowed in the ultrasound rooms during exams. Our front office staff cannot provide observation of children in our lobby area while testing is being conducted. An adult accompanying a patient to their appointment will only be allowed in the ultrasound room at the discretion of the ultrasound technician under special circumstances. We apologize for any inconvenience.  Your physician has requested that you have a carotid duplex. This test is an ultrasound of the carotid arteries in your neck. It looks at blood flow through these arteries that supply the brain with blood. Allow one hour for this exam. There are no restrictions or special instructions.   Your physician has recommended that you wear an event monitor. Event monitors are medical devices that record the heart's electrical activity. Doctors most often us  these monitors to diagnose arrhythmias. Arrhythmias are problems with the speed or  rhythm of the heartbeat. The monitor is a small, portable device. You can wear one while you do your normal daily activities. This is usually used to diagnose what is causing palpitations/syncope (passing out).   [employer] Cardiac Event Monitor Instructions  Your physician has requested you wear your cardiac event monitor for 30 days. [employer] may call or text to confirm a shipping address. The monitor will be sent to a land address via UPS. [employer] will not ship a monitor to a PO BOX. It typically takes 3-5 days to receive your monitor after it has been enrolled. [employer] will assist with USPS tracking if your package is delayed. The telephone number for [employer] is [phone]. Once you have received your monitor, please review the enclosed instructions. Instruction tutorials can also be viewed under help and settings on the enclosed cell phone. Your monitor has already been registered assigning a specific monitor serial # to you.  Billing and Self Pay Discount Information  [employer] has been provided the insurance information we had on file for you.  If your insurance has been updated, please call [employer] at [phone] to provide them with your updated insurance information.   [employer] offers a discounted Self Pay option for patients who have insurance that does not cover their cardiac event monitor or patients without insurance.  The discounted cost of a Self Pay Cardiac Event Monitor would be $225.00 , if the patient contacts [employer] at [phone] within 7 days of applying the monitor to make payment arrangements.  If the patient does not contact [employer] within 7 days of applying the monitor, the cost of the cardiac event monitor will be $350.00.  Applying the monitor  Remove  cell phone from case and turn it on. The cell phone works as IT consultant and needs to be within [employer] of you at all times. The cell phone will need to be charged on a daily basis. We  recommend you plug the cell phone into the enclosed charger at your bedside table every night.  Monitor batteries: You will receive two monitor batteries labelled #1 and #2. These are your recorders. Plug battery #2 onto the second connection on the enclosed charger. Keep one battery on the charger at all times. This will keep the monitor battery deactivated. It will also keep it fully charged for when you need to switch your monitor batteries. A small light will be blinking on the battery emblem when it is charging. The light on the battery emblem will remain on when the battery is fully charged.  Open package of a Monitor strip. Insert battery #1 into black hood on strip and gently squeeze monitor battery onto connection as indicated in instruction booklet. Set aside while preparing skin.  Choose location for your strip, vertical or horizontal, as indicated in the instruction booklet. Shave to remove all hair from location. There cannot be any lotions, oils, powders, or colognes on skin where monitor is to be applied. Wipe skin clean with enclosed Saline wipe. Dry skin completely.  Peel paper labeled #1 off the back of the Monitor strip exposing the adhesive. Place the monitor on the chest in the vertical or horizontal position shown in the instruction booklet. One arrow on the monitor strip must be pointing upward. Carefully remove paper labeled #2, attaching remainder of strip to your skin. Try not to create any folds or wrinkles in the strip as you apply it.  Firmly press and release the circle in the center of the monitor battery. You will hear a small beep. This is turning the monitor battery on. The heart emblem on the monitor battery will light up every 5 seconds if the monitor battery in turned on and connected to the patient securely. Do not push and hold the circle down as this turns the monitor battery off. The cell phone will locate the monitor battery. A screen will appear on  the cell phone checking the connection of your monitor strip. This may read poor connection initially but change to good connection within the next minute. Once your monitor accepts the connection you will hear a series of 3 beeps followed by a climbing crescendo of beeps. A screen will appear on the cell phone showing the two monitor strip placement options. Touch the picture that demonstrates where you applied the monitor strip.  Your monitor strip and battery are waterproof. You are able to shower, bathe, or swim with the monitor on. They just ask you do not submerge deeper than 3 feet underwater. We recommend removing the monitor if you are swimming in a lake, river, or ocean.  Your monitor battery will need to be switched to a fully charged monitor battery approximately once a week. The cell phone will alert you of an action which needs to be made.  On the cell phone, tap for details to reveal connection status, monitor battery status, and cell phone battery status. The green dots indicates your monitor is in good status. A red dot indicates there is something that needs your attention.  To record a symptom, click the circle on the monitor battery. In 30-60 seconds a list of symptoms will appear on the cell phone. Select your symptom and  tap save. Your monitor will record a sustained or significant arrhythmia regardless of you clicking the button. Some patients do not feel the heart rhythm irregularities. [employer] will notify us  of any serious or critical events.  Refer to instruction booklet for instructions on switching batteries, changing strips, the Do not disturb or Pause features, or any additional questions.  Call [employer] at [phone], to confirm your monitor is transmitting and record your baseline. They will answer any questions you may have regarding the monitor instructions at that time.  Returning the monitor to [employer]  Place all equipment back into blue  box. Peel off strip of paper to expose adhesive and close box securely. There is a prepaid UPS shipping label on this box. Drop in a UPS drop box, or at a UPS facility like [employer]. You may also contact [employer] to arrange UPS to pick up monitor package at your home.   Follow-Up: At [facility], you and your health needs are our priority.  As part of our continuing mission to provide you with exceptional heart care, our providers are all part of one team.  This team includes your primary Cardiologist (physician) and Advanced Practice Providers or APPs (Physician Assistants and Nurse Practitioners) who all work together to provide you with the care you need, when you need it.  Your next appointment:   2 month(s)  Provider:   Vina Gull, MD or Miriam Shams, NP or Damien Braver, NP          We recommend signing up for the patient portal called MyChart.  Sign up information is provided on this After Visit Summary.  MyChart is used to connect with patients for Virtual Visits (Telemedicine).  Patients are able to view lab/test results, encounter notes, upcoming appointments, etc.  Non-urgent messages can be sent to your provider as well.   To learn more about what you can do with MyChart, go to [email or web address].   Other Instructions Orthostatic Hypotension Blood pressure is a measurement of how strongly, or weakly, your circulating blood is pressing against the walls of your arteries. Orthostatic hypotension is a drop in blood pressure that can happen when you change positions, such as when you go from lying down to standing. Arteries are blood vessels that carry blood from your heart throughout your body. When blood pressure is too low, you may not get enough blood to your brain or to the rest of your organs. Orthostatic hypotension can cause light-headedness, sweating, rapid heartbeat, blurred vision, and fainting. These symptoms require further investigation into the  cause. What are the causes? Orthostatic hypotension can be caused by many things, including: Sudden changes in posture, such as standing up quickly after you have been sitting or lying down. Loss of blood (anemia) or loss of body fluids (dehydration). Heart problems, neurologic problems, or hormone problems. Pregnancy. Aging. The risk for this condition increases as you get older. Severe infection (sepsis). Certain medicines, such as medicines for high blood pressure or medicines that make the body lose excess fluids (diuretics). What are the signs or symptoms? Symptoms of this condition may include: Weakness, light-headedness, or dizziness. Sweating. Blurred vision. Tiredness (fatigue). Rapid heartbeat. Fainting, in severe cases. How is this diagnosed? This condition is diagnosed based on: Your symptoms and medical history. Your blood pressure measurements. Your health care provider will check your blood pressure when you are: Lying down. Sitting. Standing. A blood pressure reading is recorded as two numbers, such as 120 over 80 (or 120/80). The first (  top) number is called the systolic pressure. It is a measure of the pressure in your arteries as your heart beats. The second (bottom) number is called the diastolic pressure. It is a measure of the pressure in your arteries when your heart relaxes between beats. Blood pressure is measured in a unit called mmHg. Healthy blood pressure for most adults is 120/80 mmHg. Orthostatic hypotension is defined as a 20 mmHg drop in systolic pressure or a 10 mmHg drop in diastolic pressure within 3 minutes of standing. Other information or tests that may be used to diagnose orthostatic hypotension include: Your other vital signs, such as your heart rate and temperature. Blood tests. An electrocardiogram (ECG) or echocardiogram. A Holter monitor. This is a device you wear that records your heart rhythm continuously, usually for 24-48  hours. Tilt table test. For this test, you will be safely secured to a table that moves you from a lying position to an upright position. Your heart rhythm and blood pressure will be monitored during the test. How is this treated? This condition may be treated by: Changing your diet. This may involve eating more salt (sodium) or drinking more water. Changing the dosage of certain medicines you are taking that might be lowering your blood pressure. Correcting the underlying reason for the orthostatic hypotension. Wearing compression stockings. Taking medicines to raise your blood pressure. Avoiding actions that trigger symptoms. Follow these instructions at home: Medicines Take over-the-counter and prescription medicines only as told by your health care provider. Follow instructions from your health care provider about changing the dosage of your current medicines, if this applies. Do not stop or adjust any of your medicines on your own. Eating and drinking  Drink enough fluid to keep your urine pale yellow. Eat extra salt only as directed. Do not add extra salt to your diet unless advised by your health care provider. Eat frequent, small meals. Avoid standing up suddenly after eating. General instructions  Get up slowly from lying down or sitting positions. This gives your blood pressure a chance to adjust. Avoid hot showers and excessive heat as directed by your health care provider. Engage in regular physical activity as directed by your health care provider. If you have compression stockings, wear them as told. Keep all follow-up visits. This is important. Contact a health care provider if: You have a fever for more than 2-3 days. You feel more thirsty than usual. You feel dizzy or weak. Get help right away if: You have chest pain. You have a fast or irregular heartbeat. You become sweaty or feel light-headed. You feel short of breath. You faint. You have any symptoms of a  stroke. BE FAST is an easy way to remember the main warning signs of a stroke: B - Balance. Signs are dizziness, sudden trouble walking, or loss of balance. E - Eyes. Signs are trouble seeing or a sudden change in vision. F - Face. Signs are sudden weakness or numbness of the face, or the face or eyelid drooping on one side. A - Arms. Signs are weakness or numbness in an arm. This happens suddenly and usually on one side of the body. S - Speech. Signs are sudden trouble speaking, slurred speech, or trouble understanding what people say. T - Time. Time to call emergency services. Write down what time symptoms started. You have other signs of a stroke, such as: A sudden, severe headache with no known cause. Nausea or vomiting. Seizure. These symptoms may represent a serious problem  that is an emergency. Do not wait to see if the symptoms will go away. Get medical help right away. Call your local emergency services (911 in the [place] Do not drive yourself to the hospital. Summary Orthostatic hypotension is a sudden drop in blood pressure. It can cause light-headedness, sweating, rapid heartbeat, blurred vision, and fainting. Orthostatic hypotension can be diagnosed by having your blood pressure taken while lying down, sitting, and then standing. Treatment may involve changing your diet, wearing compression stockings, sitting up slowly, adjusting your medicines, or correcting the underlying reason for the orthostatic hypotension. Get help right away if you have chest pain, a fast or irregular heartbeat, or symptoms of a stroke. This information is not intended to replace advice given to you by your health care provider. Make sure you discuss any questions you have with your health care provider. Document Revised: 04/12/2020 Document Reviewed: 04/12/2020 Elsevier Patient Education  2024 [employer].

## 2023-11-11 DIAGNOSIS — I25118 Atherosclerotic heart disease of native coronary artery with other forms of angina pectoris: Secondary | ICD-10-CM | POA: Diagnosis not present

## 2023-11-11 DIAGNOSIS — Z1331 Encounter for screening for depression: Secondary | ICD-10-CM | POA: Diagnosis not present

## 2023-11-11 DIAGNOSIS — Z0000 Encounter for general adult medical examination without abnormal findings: Secondary | ICD-10-CM | POA: Diagnosis not present

## 2023-11-11 DIAGNOSIS — G309 Alzheimer's disease, unspecified: Secondary | ICD-10-CM | POA: Diagnosis not present

## 2023-11-11 DIAGNOSIS — E785 Hyperlipidemia, unspecified: Secondary | ICD-10-CM | POA: Diagnosis not present

## 2023-11-11 DIAGNOSIS — Z1339 Encounter for screening examination for other mental health and behavioral disorders: Secondary | ICD-10-CM | POA: Diagnosis not present

## 2023-11-11 DIAGNOSIS — J449 Chronic obstructive pulmonary disease, unspecified: Secondary | ICD-10-CM | POA: Diagnosis not present

## 2023-11-11 DIAGNOSIS — M069 Rheumatoid arthritis, unspecified: Secondary | ICD-10-CM | POA: Diagnosis not present

## 2023-11-11 DIAGNOSIS — I951 Orthostatic hypotension: Secondary | ICD-10-CM | POA: Diagnosis not present

## 2023-11-11 DIAGNOSIS — Z23 Encounter for immunization: Secondary | ICD-10-CM | POA: Diagnosis not present

## 2023-11-11 DIAGNOSIS — R32 Unspecified urinary incontinence: Secondary | ICD-10-CM | POA: Diagnosis not present

## 2023-12-16 ENCOUNTER — Ambulatory Visit (HOSPITAL_COMMUNITY): Admission: RE | Admit: 2023-12-16 | Source: Ambulatory Visit

## 2023-12-16 ENCOUNTER — Inpatient Hospital Stay (HOSPITAL_COMMUNITY): Admission: RE | Admit: 2023-12-16 | Source: Ambulatory Visit

## 2023-12-16 ENCOUNTER — Other Ambulatory Visit (HOSPITAL_COMMUNITY)

## 2023-12-16 ENCOUNTER — Encounter (HOSPITAL_COMMUNITY): Payer: Self-pay | Admitting: Emergency Medicine

## 2023-12-16 NOTE — Progress Notes (Deleted)
 Office Visit Note  Patient: Lindsay Herman             Date of Birth: [birth year]           MRN: [id number]             PCP: Shepard Ade, MD Referring: Shepard Ade, MD Visit Date: 12/30/2023 Occupation: Data Unavailable  Subjective:  No chief complaint on file.   History of Present Illness: Lindsay Herman is a 78 y.o. female ***     Activities of Daily Living:  Patient reports morning stiffness for *** {minute/hour:19697}.   Patient {ACTIONS;DENIES/REPORTS:21021675::Denies} nocturnal pain.  Difficulty dressing/grooming: {ACTIONS;DENIES/REPORTS:21021675::Denies} Difficulty climbing stairs: {ACTIONS;DENIES/REPORTS:21021675::Denies} Difficulty getting out of chair: {ACTIONS;DENIES/REPORTS:21021675::Denies} Difficulty using hands for taps, buttons, cutlery, and/or writing: {ACTIONS;DENIES/REPORTS:21021675::Denies}  No Rheumatology ROS completed.   PMFS History:  Patient Active Problem List   Diagnosis Date Noted   Mild cognitive impairment 06/30/2021   Acute medial meniscal tear, left, initial encounter 01/14/2019   Chondromalacia, left knee 01/14/2019   Loose body of left knee 01/14/2019   Scleroderma, limited (HCC) 02/10/2016   History of environmental allergies 02/10/2016   Gastroesophageal reflux disease 02/10/2016   History of colon cancer 02/10/2016   Sicca syndrome 02/01/2016   History of repair of right rotator cuff 02/01/2016   Primary osteoarthritis of both knees 02/01/2016   Primary osteoarthritis of both hands 02/01/2016   Raynaud's disease without gangrene 02/01/2016   Cystocele    Atrophic vaginitis    Osteopenia    Menopausal symptoms     Past Medical History:  Diagnosis Date   Atherosclerosis    aorta and coronary artery   Atrophic vaginitis    Cancer (HCC) 1995   Colon cancer tx surgery and chemo   Contact lens/glasses fitting    wears contacts or glasses   Cystocele    Nasal fracture    Osteopenia 06/2017   T score -2.0  FRAX 13% / 2.8%   PONV (postoperative nausea and vomiting)    Raynaud's disease    Rectocele    Rosacea    Scleroderma (HCC)    Urinary frequency     Family History  Problem Relation Age of Onset   Diabetes Mother    Heart disease Mother    Stroke Mother    Diabetes Brother    Hypertension Brother    Lupus Father    Past Surgical History:  Procedure Laterality Date   APPENDECTOMY     colon cancer  1995   COLON RESECTION  1995   KNEE ARTHROSCOPY Left 01/14/2019   Procedure: ARTHROSCOPY KNEE, PARTIAL MENISCECTOMY, CHONDROPLASTY AND LOOSE BODY REMOVAL;  Surgeon: Yvone Rush, MD;  Location: [facility];  Service: Orthopedics;  Laterality: Left;   MASS EXCISION Left 08/31/2012   Procedure: EXCISION MUCOID CYST DEBRIDEMENT DISTAL INTERPHALANGEAL LEFT MIDDLE FINGER;  Surgeon: Arley JONELLE Curia, MD;  Location: [facility];  Service: Orthopedics;  Laterality: Left;   OOPHORECTOMY     LSO   ROTATOR CUFF REPAIR Right    TONSILLECTOMY     VAGINAL HYSTERECTOMY  1994   Social History   Tobacco Use   Smoking status: Former    Current packs/day: 0.00    Average packs/day: 0.5 packs/day for 9.5 years (4.8 ttl pk-yrs)    Types: Cigarettes    Start date: 02/11/1963    Quit date: 08/25/1972    Years since quitting: 51.3    Passive exposure: Current (minimal)   Smokeless tobacco: Never  Vaping Use   Vaping status: Never Used  Substance Use Topics   Alcohol  use: Yes    Alcohol /week: 3.0 standard drinks of alcohol     Types: 3 Glasses of wine per week    Comment: 1/3 cup nightly   Drug use: Never   Social History   Social History Narrative   Right handed   Drinks caffeine   Two story home   Lives with husband     Immunization History  Administered Date(s) Administered   INFLUENZA, HIGH DOSE SEASONAL PF 10/24/2017, 11/08/2018   Influenza Split 12/12/2010   PFIZER(Purple Top)SARS-COV-2 Vaccination 03/18/2019, 04/12/2019, 09/29/2019, 03/27/2020    Pneumococcal Conjugate-13 07/24/2014   Pneumococcal Polysaccharide-23 01/31/2009   Zoster Recombinant(Shingrix) 11/27/2017, 02/12/2018     Objective: Vital Signs: There were no vitals taken for this visit.   Physical Exam   Musculoskeletal Exam: ***  CDAI Exam: CDAI Score: -- Patient Global: --; Provider Global: -- Swollen: --; Tender: -- Joint Exam 12/30/2023   No joint exam has been documented for this visit   There is currently no information documented on the homunculus. Go to the Rheumatology activity and complete the homunculus joint exam.  Investigation: No additional findings.  Imaging: No results found.  Recent Labs: Lab Results  Component Value Date   WBC 9.7 08/18/2023   HGB 14.2 08/18/2023   PLT 287 08/18/2023   NA 141 08/18/2023   K 4.2 08/18/2023   CL 107 08/18/2023   CO2 24 08/18/2023   GLUCOSE 128 (H) 08/18/2023   BUN 11 08/18/2023   CREATININE 0.78 08/18/2023   BILITOT 0.3 07/29/2023   ALKPHOS 56 01/26/2023   AST 16 07/29/2023   ALT 15 07/29/2023   PROT 6.3 07/29/2023   ALBUMIN 4.2 01/26/2023   CALCIUM  8.9 08/18/2023   GFRAA 97 06/14/2020   QFTBGOLDPLUS NEGATIVE 10/06/2017    Speciality Comments: No specialty comments available.  Procedures:  No procedures performed Allergies: Codeine, Morphine, and Morphine and codeine   Assessment / Plan:     Visit Diagnoses: Scleroderma, limited (HCC)  Chronic inflammatory arthritis  Interstitial lung disease (HCC)  High risk medication use  Raynaud's disease without gangrene  TPMT intermediate metabolizer (HCC)  History of repair of right rotator cuff  Primary osteoarthritis of both hands  Primary osteoarthritis of both knees  S/P arthroscopy of right knee  Osteopenia of multiple sites  Vitamin D  deficiency  History of gastroesophageal reflux (GERD)  History of colon cancer  Memory loss  Orders: No orders of the defined types were placed in this encounter.  No orders of  the defined types were placed in this encounter.   Face-to-face time spent with patient was *** minutes. Greater than 50% of time was spent in counseling and coordination of care.  Follow-Up Instructions: No follow-ups on file.   Waddell CHRISTELLA Craze, PA-C  Note - This record has been created using Dragon software.  Chart creation errors have been sought, but may not always  have been located. Such creation errors do not reflect on  the standard of medical care.

## 2023-12-30 ENCOUNTER — Ambulatory Visit: Admitting: Physician Assistant

## 2023-12-30 ENCOUNTER — Ambulatory Visit (HOSPITAL_COMMUNITY): Admission: RE | Admit: 2023-12-30 | Source: Ambulatory Visit

## 2023-12-30 DIAGNOSIS — R413 Other amnesia: Secondary | ICD-10-CM

## 2023-12-30 DIAGNOSIS — Z85038 Personal history of other malignant neoplasm of large intestine: Secondary | ICD-10-CM

## 2023-12-30 DIAGNOSIS — Z79899 Other long term (current) drug therapy: Secondary | ICD-10-CM

## 2023-12-30 DIAGNOSIS — I7300 Raynaud's syndrome without gangrene: Secondary | ICD-10-CM

## 2023-12-30 DIAGNOSIS — E8889 Other specified metabolic disorders: Secondary | ICD-10-CM

## 2023-12-30 DIAGNOSIS — M8589 Other specified disorders of bone density and structure, multiple sites: Secondary | ICD-10-CM

## 2023-12-30 DIAGNOSIS — Z8719 Personal history of other diseases of the digestive system: Secondary | ICD-10-CM

## 2023-12-30 DIAGNOSIS — M349 Systemic sclerosis, unspecified: Secondary | ICD-10-CM

## 2023-12-30 DIAGNOSIS — M1380 Other specified arthritis, unspecified site: Secondary | ICD-10-CM

## 2023-12-30 DIAGNOSIS — J849 Interstitial pulmonary disease, unspecified: Secondary | ICD-10-CM

## 2023-12-30 DIAGNOSIS — E559 Vitamin D deficiency, unspecified: Secondary | ICD-10-CM

## 2023-12-30 DIAGNOSIS — M170 Bilateral primary osteoarthritis of knee: Secondary | ICD-10-CM

## 2023-12-30 DIAGNOSIS — Z9889 Other specified postprocedural states: Secondary | ICD-10-CM

## 2023-12-30 DIAGNOSIS — M19041 Primary osteoarthritis, right hand: Secondary | ICD-10-CM

## 2024-01-05 ENCOUNTER — Ambulatory Visit: Admitting: Nurse Practitioner

## 2024-02-19 ENCOUNTER — Encounter: Payer: Self-pay | Admitting: Emergency Medicine

## 2024-08-25 ENCOUNTER — Ambulatory Visit: Admitting: Physician Assistant
